# Patient Record
Sex: Male | Born: 1955
Health system: Southern US, Community
[De-identification: ages and names within clinical notes are randomized; demographics above are authoritative.]

## PROBLEM LIST (undated history)

## (undated) DIAGNOSIS — T7840XA Allergy, unspecified, initial encounter: Secondary | ICD-10-CM

## (undated) DIAGNOSIS — M1990 Unspecified osteoarthritis, unspecified site: Secondary | ICD-10-CM

## (undated) DIAGNOSIS — I10 Essential (primary) hypertension: Secondary | ICD-10-CM

## (undated) DIAGNOSIS — K5010 Crohn's disease of large intestine without complications: Secondary | ICD-10-CM

## (undated) HISTORY — DX: Unspecified osteoarthritis, unspecified site: M19.90

## (undated) HISTORY — DX: Allergy, unspecified, initial encounter: T78.40XA

## (undated) HISTORY — DX: Essential (primary) hypertension: I10

## (undated) HISTORY — DX: Crohn's disease of large intestine without complications: K50.10

## (undated) HISTORY — PX: JOINT REPLACEMENT: SHX530

## (undated) HISTORY — PX: TONSILLECTOMY: SUR1361

---

## 2011-01-12 HISTORY — PX: COLONOSCOPY: SHX174

## 2011-04-09 ENCOUNTER — Ambulatory Visit (INDEPENDENT_AMBULATORY_CARE_PROVIDER_SITE_OTHER): Payer: BC Managed Care – PPO

## 2011-04-09 DIAGNOSIS — R059 Cough, unspecified: Secondary | ICD-10-CM

## 2011-04-09 DIAGNOSIS — J40 Bronchitis, not specified as acute or chronic: Secondary | ICD-10-CM

## 2011-04-09 DIAGNOSIS — J019 Acute sinusitis, unspecified: Secondary | ICD-10-CM

## 2011-04-09 DIAGNOSIS — R05 Cough: Secondary | ICD-10-CM

## 2011-04-09 DIAGNOSIS — R5381 Other malaise: Secondary | ICD-10-CM

## 2011-07-04 ENCOUNTER — Ambulatory Visit (INDEPENDENT_AMBULATORY_CARE_PROVIDER_SITE_OTHER): Payer: BC Managed Care – PPO | Admitting: Internal Medicine

## 2011-07-04 VITALS — BP 149/80 | HR 76 | Temp 97.9°F | Resp 18 | Ht 65.0 in | Wt 174.0 lb

## 2011-07-04 DIAGNOSIS — M7040 Prepatellar bursitis, unspecified knee: Secondary | ICD-10-CM

## 2011-07-04 DIAGNOSIS — M7042 Prepatellar bursitis, left knee: Secondary | ICD-10-CM

## 2011-07-04 MED ORDER — MELOXICAM 7.5 MG PO TABS: 7.5000 mg | ORAL_TABLET | Freq: Every day | ORAL | Status: DC

## 2011-07-04 NOTE — Patient Instructions (Signed)
Continue to ice your knee as needed and avoid excessive kneeling.  Try using a pad if you must kneel.  Use the Mobic as needed.  Recheck you BP in 1-2 weeks if it is still elevated (above 140/90) return to the clinic.  Prepatellar Bursitis with Rehab  Bursitis is a condition that is characterized by inflammation of a bursa. Saunders Revel exists in many areas of the body. They are fluid filled sacs that lie between a soft tissue (skin, tendon, or ligament) and a bone, and they reduce friction between the structures as well as the stress placed on the soft tissue. Prepatellar bursitis is inflammation of the bursa that lies between the skin and the kneecap (patella). This condition often causes pain over the patella. SYMPTOMS   Pain, tenderness, and/or inflammation over the patella.   Pain that worsens with movement of the knee joint.   Decreased range of motion for the knee joint.   A crackling sound (crepitation) when the bursa is moved or touched.   Occasionally, painless swelling of the bursa.   Fever (when infected).  CAUSES  Bursitis is caused by damage to the bursa, which results in an inflammatory response. Common mechanisms of injury include:  Direct trauma to the front of the knee.   Repetitive and/ or stressful use of the knee.  RISK INCREASES WITH:  Activities in which kneeling and/or falling on one's knees is likely (volleyball or football).   Repetitive and stressful training, especially if it involves running on hills.   Improper training techniques, such as a sudden increase in the intensity, frequency or duration of training.   Failure to warm-up properly before activity.   Poor technique.   Artificial turf.  PREVENTION   Avoid kneeling or falling on your knees.   Warm up and stretch properly before activity.   Allow for adequate recovery between workouts.   Maintain physical fitness:   Strength, flexibility, and endurance.   Cardiovascular fitness.   Learn  and use proper technique. When possible, a have coach correct improper technique.   Wear properly fitted and padded protective equipment (knee pads).  PROGNOSIS  If treated properly, then the symptoms of prepatellar bursitis usually resolve within 2 weeks. RELATED COMPLICATIONS   Recurrent symptoms that result in a chronic problem.   Prolonged healing time, if improperly treated or re-injured.   Limited range of motion.   Infection of bursa.   Chronic inflammation or scarring of bursa.  TREATMENT  Treatment initially involves the use of ice and medication to help reduce pain and inflammation. The use of strengthening and stretching exercises may help reduce pain with activity, especially those of the quadriceps and hamstring muscles. These exercises may be performed at home or with referral to a therapist. Your caregiver may recommend kneepads when you return to playing sports, in order to reduce the stress on the prepatellar bursa. If symptoms persist despite treatment, then your caregiver may drain fluid out with a needle (aspirate) the bursa. If symptoms persist for greater than 6 months despite non-surgical (conservative) treatment, then surgery may be recommended to remove the bursa.  MEDICATION  If pain medication is necessary, then nonsteroidal anti-inflammatory medications, such as aspirin and ibuprofen, or other minor pain relievers, such as acetaminophen, are often recommended.   Do not take pain medication for 7 days before surgery.   Prescription pain relievers may be given if deemed necessary by your caregiver. Use only as directed and only as much as you need.  Corticosteroid injections may be given by your caregiver. These injections should be reserved for the most serious cases, because they may only be given a certain number of times.  HEAT AND COLD  Cold treatment (icing) relieves pain and reduces inflammation. Cold treatment should be applied for 10 to 15 minutes  every 2 to 3 hours for inflammation and pain and immediately after any activity that aggravates your symptoms. Use ice packs or massage the area with a piece of ice (ice massage).   Heat treatment may be used prior to performing the stretching and strengthening activities prescribed by your caregiver, physical therapist, or athletic trainer. Use a heat pack or soak the injury in warm water.  SEEK MEDICAL CARE IF:  Treatment seems to offer no benefit, or the condition worsens.   Any medications produce adverse side effects.  EXERCISES RANGE OF MOTION (ROM) AND STRETCHING EXERCISES - Prepatellar Bursitis These exercises may help you when beginning to rehabilitate your injury. Your symptoms may resolve with or without further involvement from your physician, physical therapist or athletic trainer. While completing these exercises, remember:   Restoring tissue flexibility helps normal motion to return to the joints. This allows healthier, less painful movement and activity.   An effective stretch should be held for at least 30 seconds.   A stretch should never be painful. You should only feel a gentle lengthening or release in the stretched tissue.  STRETCH - Hamstrings, Standing  Stand or sit and extend your right / left leg, placing your foot on a chair or foot stool   Keeping a slight arch in your low back and your hips straight forward.   Lead with your chest and lean forward at the waist until you feel a gentle stretch in the back of your right / left knee or thigh. (When done correctly, this exercise requires leaning only a small distance.)   Hold this position for __________ seconds.  Repeat __________ times. Complete this stretch __________ times per day. STRETCH - Quadriceps, Prone   Lie on your stomach on a firm surface, such as a bed or padded floor.   Bend your right / left knee and grasp your ankle. If you are unable to reach, your ankle or pant leg, use a belt around your  foot to lengthen your reach.   Gently pull your heel toward your buttocks. Your knee should not slide out to the side. You should feel a stretch in the front of your thigh and/or knee.   Hold this position for __________ seconds.  Repeat __________ times. Complete this stretch __________ times per day.  STRETCH - Hamstrings/Adductors, V-Sit   Sit on the floor with your legs extended in a large "V," keeping your knees straight.   With your head and chest upright, bend at your waist reaching for your right foot to stretch your left adductors.   You should feel a stretch in your left inner thigh. Hold for __________ seconds.   Return to the upright position to relax your leg muscles.   Continuing to keep your chest upright, bend straight forward at your waist to stretch your hamstrings.   You should feel a stretch behind both of your thighs and/or knees. Hold for __________ seconds.   Return to the upright position to relax your leg muscles.   Repeat steps 2 through 4.  Repeat __________ times. Complete this exercise __________ times per day.  STRENGTHENING EXERCISES - Prepatellar Bursitis  These exercises may help you when  beginning to rehabilitate your injury. They may resolve your symptoms with or without further involvement from your physician, physical therapist or athletic trainer. While completing these exercises, remember:   Muscles can gain both the endurance and the strength needed for everyday activities through controlled exercises.   Complete these exercises as instructed by your physician, physical therapist or athletic trainer. Progress the resistance and repetitions only as guided.  STRENGTH - Quadriceps, Isometrics  Lie on your back with your right / left leg extended and your opposite knee bent.   Gradually tense the muscles in the front of your right / left thigh. You should see either your kneecap slide up toward your hip or increased dimpling just above the knee.  This motion will push the back of the knee down toward the floor/mat/bed on which you are lying.   Hold the muscle as tight as you can without increasing your pain for __________ seconds.   Relax the muscles slowly and completely in between each repetition.  Repeat __________ times. Complete this exercise __________ times per day.  STRENGTH - Quadriceps, Short Arcs   Lie on your back. Place a __________ inch towel roll under your knee so that the knee slightly bends.   Raise only your lower leg by tightening the muscles in the front of your thigh. Do not allow your thigh to rise.   Hold this position for __________ seconds.  Repeat __________ times. Complete this exercise __________ times per day.  OPTIONAL ANKLE WEIGHTS: Begin with ____________________, but DO NOT exceed ____________________. Increase in1 lb/0.5 kg increments.  STRENGTH - Quadriceps, Straight Leg Raises  Quality counts! Watch for signs that the quadriceps muscle is working to insure you are strengthening the correct muscles and not "cheating" by substituting with healthier muscles.  Lay on your back with your right / left leg extended and your opposite knee bent.   Tense the muscles in the front of your right / left thigh. You should see either your kneecap slide up or increased dimpling just above the knee. Your thigh may even quiver.   Tighten these muscles even more and raise your leg 4 to 6 inches off the floor. Hold for __________ seconds.   Keeping these muscles tense, lower your leg.   Relax the muscles slowly and completely in between each repetition.  Repeat __________ times. Complete this exercise __________ times per day.  STRENGTH - Quadriceps, Step-Ups   Use a thick book, step or step stool that is __________ inches tall.   Holding a wall or counter for balance only, not support.   Slowly step-up with your right / left foot, keeping your knee in line with your hip and foot. Do not allow your knee to  bend so far that you cannot see your toes.   Slowly unlock your knee and lower yourself to the starting position. Your muscles, not gravity, should lower you.  Repeat __________ times. Complete this exercise __________ times per day. Document Released: 03/16/2005 Document Revised: 03/05/2011 Document Reviewed: 06/28/2008 [facility] Patient Information 2012 [place].

## 2011-07-04 NOTE — Progress Notes (Signed)
  Subjective:    Patient ID: Phillip Glass, male    DOB: Sep 30, 1955, 56 y.o.   MRN: [id number]  HPI  Phillip Glass is here for pain in the front of his left knee.  Only hurts when he kneels on that knee.  He works for [employer] and has been working 60 hours a week and is experiencing knee pain when he is "stacking" only.  No weakness, numbness or pain in his foot, ankle or hip.  He had a CPE with Dr. Elder Cyphers in September.  Bp is usually not elevated.    Review of Systems  All other systems reviewed and are negative.       Objective:   Physical Exam  Vitals reviewed. Constitutional: He appears well-developed and well-nourished. No distress.  HENT:  Head: Normocephalic.  Mouth/Throat: Oropharynx is clear and moist.  Cardiovascular: Normal rate, regular rhythm and normal heart sounds.   Musculoskeletal: Normal range of motion. He exhibits tenderness. He exhibits no edema.       Right knee tender over prepatellar bursa, but no significant swelling or redness is present.  Normal ROM, no Baker's cyst.          Assessment & Plan:  Prepatellar Bursitis:  Mobic 7.5 mg if needed.  Ice knees and avoid kneeling!  Given AVS with instructions and information on exercises and stretches.  Given letter to keep his hours to no more than 50 a week.  Keep an eye on his BP and if it remains above 140/90 he is to RTC.  Pt agrees.

## 2012-03-03 ENCOUNTER — Ambulatory Visit (INDEPENDENT_AMBULATORY_CARE_PROVIDER_SITE_OTHER): Payer: BC Managed Care – PPO | Admitting: Family Medicine

## 2012-03-03 VITALS — BP 141/79 | HR 73 | Temp 98.4°F | Resp 17 | Ht 66.0 in | Wt 173.0 lb

## 2012-03-03 DIAGNOSIS — Z0000 Encounter for general adult medical examination without abnormal findings: Secondary | ICD-10-CM

## 2012-03-03 DIAGNOSIS — D229 Melanocytic nevi, unspecified: Secondary | ICD-10-CM | POA: Insufficient documentation

## 2012-03-03 LAB — COMPREHENSIVE METABOLIC PANEL
ALT: 18 U/L (ref 0–53)
Albumin: 4.5 g/dL (ref 3.5–5.2)
CO2: 30 mEq/L (ref 19–32)
Calcium: 9.3 mg/dL (ref 8.4–10.5)
Chloride: 102 mEq/L (ref 96–112)
Glucose, Bld: 102 mg/dL — ABNORMAL HIGH (ref 70–99)
Potassium: 4.2 mEq/L (ref 3.5–5.3)
Sodium: 140 mEq/L (ref 135–145)
Total Protein: 6.9 g/dL (ref 6.0–8.3)

## 2012-03-03 LAB — POCT CBC
Granulocyte percent: 75.8 %G (ref 37–80)
Hemoglobin: 14.7 g/dL (ref 14.1–18.1)
MCH, POC: 31.7 pg — AB (ref 27–31.2)
MCV: 99.8 fL — AB (ref 80–97)
MPV: 8.4 fL (ref 0–99.8)
POC MID %: 6.9 %M (ref 0–12)
Platelet Count, POC: 330 10*3/uL (ref 142–424)
RBC: 4.63 M/uL — AB (ref 4.69–6.13)
WBC: 7.4 10*3/uL (ref 4.6–10.2)

## 2012-03-03 LAB — POCT URINALYSIS DIPSTICK
Bilirubin, UA: NEGATIVE
Leukocytes, UA: NEGATIVE
Protein, UA: NEGATIVE
Spec Grav, UA: 1.015

## 2012-03-03 LAB — LIPID PANEL
Cholesterol: 196 mg/dL (ref 0–200)
Triglycerides: 83 mg/dL (ref <150)
VLDL: 17 mg/dL (ref 0–40)

## 2012-03-03 LAB — POCT UA - MICROSCOPIC ONLY
Bacteria, U Microscopic: NEGATIVE
Crystals, Ur, HPF, POC: NEGATIVE
RBC, urine, microscopic: NEGATIVE
WBC, Ur, HPF, POC: NEGATIVE

## 2012-03-03 LAB — VITAMIN B12: Vitamin B-12: 706 pg/mL (ref 211–911)

## 2012-03-03 LAB — TSH: TSH: 1.824 u[IU]/mL (ref 0.350–4.500)

## 2012-03-03 NOTE — Assessment & Plan Note (Signed)
New. Refer to dermatology for evaluation.

## 2012-03-03 NOTE — Progress Notes (Signed)
[place], [place]  [address]   [phone]  Subjective:    Patient ID: Phillip Glass, male    DOB: [birth year], 56 y.o.   MRN: [id number]  HPIThis 56 y.o. male presents for CPE.  Last physical 12-2010.  Colonoscopy 12-2010.  [facility]AP 2008 at [facility].  Influenza vaccine never.  Eye exam 12/2011.  Dental exam 01/2012.     Review of Systems  Constitutional: Negative for fever, chills, diaphoresis, activity change, appetite change, fatigue and unexpected weight change.  HENT: Negative for hearing loss, ear pain, nosebleeds, congestion, sore throat, facial swelling, rhinorrhea, sneezing, drooling, mouth sores, trouble swallowing, neck pain, neck stiffness, dental problem, voice change, postnasal drip, sinus pressure, tinnitus and ear discharge.   Eyes: Negative for photophobia, pain, discharge, redness, itching and visual disturbance.  Respiratory: Negative for apnea, cough, choking, chest tightness, shortness of breath, wheezing and stridor.   Cardiovascular: Negative for chest pain, palpitations and leg swelling.  Gastrointestinal: Negative for nausea, vomiting, abdominal pain, diarrhea, constipation, blood in stool, abdominal distention, anal bleeding and rectal pain.  Genitourinary: Negative for dysuria, urgency, frequency, hematuria, flank pain, decreased urine volume, discharge, penile swelling, scrotal swelling, enuresis, difficulty urinating, genital sores, penile pain and testicular pain.  Musculoskeletal: Negative for myalgias, back pain, joint swelling, arthralgias and gait problem.  Skin: Negative for color change, pallor, rash and wound.  Neurological: Negative for dizziness, tremors, seizures, syncope, facial asymmetry, speech difficulty, weakness, light-headedness, numbness and headaches.  Hematological: Negative for adenopathy. Does not bruise/bleed easily.  Psychiatric/Behavioral: Positive for sleep disturbance. Negative for suicidal ideas, hallucinations, behavioral  problems, confusion, self-injury, dysphoric mood, decreased concentration and agitation. The patient is not nervous/anxious and is not hyperactive.     Past Medical History  Diagnosis Date  . Colitis 03/30/1978    Past Surgical History  Procedure Date  . Colonoscopy 01/12/2011    Prior to Admission medications   Medication Sig Start Date End Date Taking? Authorizing Provider  Multiple Vitamins-Minerals (MULTIVITAMIN WITH MINERALS) tablet Take 1 tablet by mouth daily.   Yes Historical Provider, MD  meloxicam (MOBIC) 7.5 MG tablet Take 1 tablet (7.5 mg total) by mouth daily. 07/04/11 07/03/12  Kemper Durie, PA-C    Allergies  Allergen Reactions  . Codeine     History   Social History  . Marital Status: Legally Separated    Spouse Name: N/A    Number of Children: N/A  . Years of Education: N/A   Occupational History  . Not on file.   Social History Main Topics  . Smoking status: Never Smoker   . Smokeless tobacco: Not on file  . Alcohol Use: No  . Drug Use: No  . Sexually Active: No   Other Topics Concern  . Not on file   Social History Narrative   Marital status: divorced in 2013.  Not dating.  Married x 20 years.   Children:  1 child (22); no grandchild.   Lives: with son.   Employment: Scientist, clinical (histocompatibility and immunogenetics) x 35 years; happy.   Tobacco; none   Alcohol: none   Drugs; none   Exercise:  6 days per week for 45 minutes.  Cardio twice weekly; weightlifting 5 days per week.   Seatbelt: 100%   Guns:  Locked and loaded.    Family History  Problem Relation Age of Onset  . Hypertension Father   . Heart disease Father 47    AMI age 48; second  AMI age 75 cause of  death  . Diabetes Sister   . Dementia Mother   . Hypertension Mother        Objective:   Physical Exam  Nursing note and vitals reviewed. Constitutional: He is oriented to person, place, and time. He appears well-developed and well-nourished. No distress.  HENT:  Head: Normocephalic and atraumatic.  Right Ear:  External ear normal.  Left Ear: External ear normal.  Nose: Nose normal.  Mouth/Throat: Oropharynx is clear and moist.  Eyes: Conjunctivae normal and EOM are normal. Pupils are equal, round, and reactive to light.  Neck: Normal range of motion. Neck supple. No JVD present. No thyromegaly present.  Cardiovascular: Normal rate, regular rhythm and intact distal pulses.  Exam reveals no gallop and no friction rub.   No murmur heard. Pulmonary/Chest: Effort normal and breath sounds normal. No respiratory distress. He has no wheezes. He has no rales. He exhibits no tenderness.  Abdominal: Soft. Bowel sounds are normal. He exhibits no distension and no mass. There is no tenderness. There is no rebound and no guarding. Hernia confirmed negative in the right inguinal area and confirmed negative in the left inguinal area.  Genitourinary: Rectum normal, prostate normal, testes normal and penis normal. Right testis shows no mass, no swelling and no tenderness. Left testis shows no mass, no swelling and no tenderness. No penile tenderness.  Lymphadenopathy:    He has no cervical adenopathy.       Right: No inguinal adenopathy present.       Left: No inguinal adenopathy present.  Neurological: He is alert and oriented to person, place, and time. He has normal reflexes. No cranial nerve deficit. He exhibits normal muscle tone. Coordination normal.  Skin: Skin is warm and dry. No rash noted. He is not diaphoretic. No erythema. No pallor.       Multiple nevi back, legs.  Psychiatric: He has a normal mood and affect. His behavior is normal. Judgment and thought content normal.   EKG:  NSR.  No acute changes.       Assessment & Plan:   1. Routine general medical examination at a health care facility  POCT CBC, POCT glycosylated hemoglobin (Hb A1C), POCT UA - Microscopic Only, POCT urinalysis dipstick, Comprehensive metabolic panel, Lipid panel, PSA, TSH, Vitamin B12, Vitamin D, 25-hydroxy, EKG 12-Lead

## 2012-03-03 NOTE — Patient Instructions (Addendum)
1. Routine general medical examination at a health care facility  POCT CBC, POCT glycosylated hemoglobin (Hb A1C), POCT UA - Microscopic Only, POCT urinalysis dipstick, Comprehensive metabolic panel, Lipid panel, PSA, TSH, Vitamin B12, Vitamin D, 25-hydroxy, EKG 12-Lead  2. Multiple nevi  Ambulatory referral to Dermatology

## 2012-03-03 NOTE — Assessment & Plan Note (Signed)
Anticipatory guidance --- continued weight maintenance, exercise.  Start ASA 78m daily.  Colonoscopy UTD.  Declined flu vaccine.  Obtain labs.

## 2012-03-09 NOTE — Progress Notes (Signed)
Six week f-up appt made for 04/25/12. Phillip Glass

## 2012-03-12 ENCOUNTER — Encounter: Payer: Self-pay | Admitting: Family Medicine

## 2012-03-12 ENCOUNTER — Ambulatory Visit (INDEPENDENT_AMBULATORY_CARE_PROVIDER_SITE_OTHER): Payer: BC Managed Care – PPO | Admitting: Family Medicine

## 2012-03-12 VITALS — BP 123/78 | HR 71 | Temp 98.1°F | Resp 16 | Ht 65.75 in | Wt 171.2 lb

## 2012-03-12 DIAGNOSIS — K625 Hemorrhage of anus and rectum: Secondary | ICD-10-CM

## 2012-03-12 DIAGNOSIS — Z8719 Personal history of other diseases of the digestive system: Secondary | ICD-10-CM

## 2012-03-12 DIAGNOSIS — Z87898 Personal history of other specified conditions: Secondary | ICD-10-CM

## 2012-03-12 DIAGNOSIS — K6289 Other specified diseases of anus and rectum: Secondary | ICD-10-CM

## 2012-03-12 MED ORDER — HYDROCORTISONE ACETATE 25 MG RE SUPP: 25.0000 mg | Freq: Two times a day (BID) | RECTAL | Status: DC

## 2012-03-12 NOTE — Patient Instructions (Addendum)
Due to the location of your pain, a hemorrhoid is possible, but one was not identified on exam today.  Try to avoid prolonged standing or sitting, warm water soaks if feels better, anusol suppositories as prescribed and recheck in next 2-3 days if not improving - here or your gastroenterologist. Return to the clinic or go to the nearest emergency room if any of your symptoms worsen or new symptoms occur.  Hemorrhoids Hemorrhoids are enlarged (dilated) veins around the rectum. There are 2 types of hemorrhoids, and the type of hemorrhoid is determined by its location. Internal hemorrhoids occur in the veins just inside the rectum.They are usually not painful, but they may bleed.However, they may poke through to the outside and become irritated and painful. External hemorrhoids involve the veins outside the anus and can be felt as a painful swelling or hard lump near the anus.They are often itchy and may crack and bleed. Sometimes clots will form in the veins. This makes them swollen and painful. These are called thrombosed hemorrhoids. CAUSES Causes of hemorrhoids include:  Pregnancy. This increases the pressure in the hemorrhoidal veins.  Constipation.  Straining to have a bowel movement.  Obesity.  Heavy lifting or other activity that caused you to strain. TREATMENT Most of the time hemorrhoids improve in 1 to 2 weeks. However, if symptoms do not seem to be getting better or if you have a lot of rectal bleeding, your caregiver may perform a procedure to help make the hemorrhoids get smaller or remove them completely.Possible treatments include:  Rubber band ligation. A rubber band is placed at the base of the hemorrhoid to cut off the circulation.  Sclerotherapy. A chemical is injected to shrink the hemorrhoid.  Infrared light therapy. Tools are used to burn the hemorrhoid.  Hemorrhoidectomy. This is surgical removal of the hemorrhoid. HOME CARE INSTRUCTIONS   Increase fiber in your  diet. Ask your caregiver about using fiber supplements.  Drink enough water and fluids to keep your urine clear or pale yellow.  Exercise regularly.  Go to the bathroom when you have the urge to have a bowel movement. Do not wait.  Avoid straining to have bowel movements.  Keep the anal area dry and clean.  Only take over-the-counter or prescription medicines for pain, discomfort, or fever as directed by your caregiver. If your hemorrhoids are thrombosed:  Take warm sitz baths for 20 to 30 minutes, 3 to 4 times per day.  If the hemorrhoids are very tender and swollen, place ice packs on the area as tolerated. Using ice packs between sitz baths may be helpful. Fill a plastic bag with ice. Place a towel between the bag of ice and your skin.  Medicated creams and suppositories may be used or applied as directed.  Do not use a donut-shaped pillow or sit on the toilet for long periods. This increases blood pooling and pain. SEEK MEDICAL CARE IF:   You have increasing pain and swelling that is not controlled with your medicine.  You have uncontrolled bleeding.  You have difficulty or you are unable to have a bowel movement.  You have pain or inflammation outside the area of the hemorrhoids.  You have chills or an oral temperature above 102 F (38.9 C). MAKE SURE YOU:   Understand these instructions.  Will watch your condition.  Will get help right away if you are not doing well or get worse. Document Released: 03/13/2000 Document Revised: 06/08/2011 Document Reviewed: 02/24/2010 [facility] Patient Information 2013 [place].

## 2012-03-12 NOTE — Progress Notes (Signed)
Subjective:    Patient ID: Phillip Glass, male    DOB: 1955/10/18, 56 y.o.   MRN: [id number]  HPI Phillip Glass is a 56 y.o. male Bright red blood in stool, pain in rectal area with standing for awhile.  Better with sitting. Loose stool - liquid/gas with mucus - past week. (same time period for rectal pain). Bright red blood with wiping. Colonoscopy 10/12 - colonoscopy with colitis. GI: [facility] in [place]. Treated multiple times in past for colitis? No recent treatment.   Tx: Taking 229m ibuprofen once per day when working. otc preparation H for 3 days - suppository - helped pain for a little while. Hemorrhoids 24 years ago.     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 No recent change in lifting/straining. Does workout, but no recent heavy weight or straining.    Review of Systems  Constitutional: Negative for fever, chills and unexpected weight change.  Gastrointestinal: Positive for diarrhea (looser stool - see above. ) and blood in stool. Negative for constipation.       Objective:   Physical Exam  Vitals reviewed. Constitutional: He appears well-developed and well-nourished.  HENT:  Head: Normocephalic and atraumatic.  Pulmonary/Chest: Effort normal.  Abdominal: Soft. Bowel sounds are normal. He exhibits no distension. There is no tenderness. There is no CVA tenderness.  Genitourinary: Rectal exam shows tenderness (discomfort on l side of anus - internally, without noted hemorrhoid.  uncomfortable exam.) and anal tone abnormal. Rectal exam shows no external hemorrhoid and no fissure. Prostate is not tender.  Skin: Skin is warm and dry.   Results for orders placed in visit on 03/12/12  IFOBT (OCCULT BLOOD)      Component Value Range   IFOBT Positive         Assessment & Plan:  Phillip CASSis a 56y.o. male 1. Rectum bleeding  IFOBT POC (occult bld, rslt in office), hydrocortisone (ANUSOL-HC) 25 MG suppository  2. Hx of hemorrhoids   hydrocortisone (ANUSOL-HC) 25 MG suppository  3. Rectal or anal pain  hydrocortisone (ANUSOL-HC) 25 MG suppository    Possible hemorrhoid based on history and exam with ttp on L rectal area near anus without visualized hemorrhoid. Hx of colitis years ago, but no abdominal pain, and has been asx for a long time. No external hemorrhoid or fissure noted, but symptomatic improvement at home with suppository. Will try relative rest - see work note, sitz baths if needed, and trial of Anusol suppositories - ok to rf x 1 if needed. If not improving in next 2-3 days - will likely need to see GI, or rtc for further eval or possible tx of  Colitis. Deferred anoscopy today, but may need to do this in next few days if not improving. rtc sooner if any worsening.   Patient Instructions  Due to the location of your pain, a hemorrhoid is possible, but one was not identified on exam today.  Try to avoid prolonged standing or sitting, warm water soaks if feels better, anusol suppositories as prescribed and recheck in next 2-3 days if not improving - here or your gastroenterologist. Return to the clinic or go to the nearest emergency room if any of your symptoms worsen or new symptoms occur.  Hemorrhoids Hemorrhoids are enlarged (dilated) veins around the rectum. There are 2 types of hemorrhoids, and the type of hemorrhoid is determined by its location. Internal hemorrhoids occur in the veins just inside the rectum.They are usually not painful,  but they may bleed.However, they may poke through to the outside and become irritated and painful. External hemorrhoids involve the veins outside the anus and can be felt as a painful swelling or hard lump near the anus.They are often itchy and may crack and bleed. Sometimes clots will form in the veins. This makes them swollen and painful. These are called thrombosed hemorrhoids. CAUSES Causes of hemorrhoids include:  Pregnancy. This increases the pressure in the hemorrhoidal  veins.  Constipation.  Straining to have a bowel movement.  Obesity.  Heavy lifting or other activity that caused you to strain. TREATMENT Most of the time hemorrhoids improve in 1 to 2 weeks. However, if symptoms do not seem to be getting better or if you have a lot of rectal bleeding, your caregiver may perform a procedure to help make the hemorrhoids get smaller or remove them completely.Possible treatments include:  Rubber band ligation. A rubber band is placed at the base of the hemorrhoid to cut off the circulation.  Sclerotherapy. A chemical is injected to shrink the hemorrhoid.  Infrared light therapy. Tools are used to burn the hemorrhoid.  Hemorrhoidectomy. This is surgical removal of the hemorrhoid. HOME CARE INSTRUCTIONS   Increase fiber in your diet. Ask your caregiver about using fiber supplements.  Drink enough water and fluids to keep your urine clear or pale yellow.  Exercise regularly.  Go to the bathroom when you have the urge to have a bowel movement. Do not wait.  Avoid straining to have bowel movements.  Keep the anal area dry and clean.  Only take over-the-counter or prescription medicines for pain, discomfort, or fever as directed by your caregiver. If your hemorrhoids are thrombosed:  Take warm sitz baths for 20 to 30 minutes, 3 to 4 times per day.  If the hemorrhoids are very tender and swollen, place ice packs on the area as tolerated. Using ice packs between sitz baths may be helpful. Fill a plastic bag with ice. Place a towel between the bag of ice and your skin.  Medicated creams and suppositories may be used or applied as directed.  Do not use a donut-shaped pillow or sit on the toilet for long periods. This increases blood pooling and pain. SEEK MEDICAL CARE IF:   You have increasing pain and swelling that is not controlled with your medicine.  You have uncontrolled bleeding.  You have difficulty or you are unable to have a bowel  movement.  You have pain or inflammation outside the area of the hemorrhoids.  You have chills or an oral temperature above 102 F (38.9 C). MAKE SURE YOU:   Understand these instructions.  Will watch your condition.  Will get help right away if you are not doing well or get worse. Document Released: 03/13/2000 Document Revised: 06/08/2011 Document Reviewed: 02/24/2010 [facility] Patient Information 2013 [place].

## 2012-03-13 ENCOUNTER — Telehealth: Payer: Self-pay

## 2012-03-13 NOTE — Telephone Encounter (Signed)
PT WOULD LIKE TO KNOW IF THERE IS ANYTHING THAT HE CAN TAKE FOR HIS RECTAL PAIN. BEST# [phone] PHARMACY: [facility] [place]

## 2012-03-14 NOTE — Telephone Encounter (Signed)
Spoke with patient states he is doing better.  He is due for a follow up on Tuesday 12/17, he will return to clinic then.

## 2012-03-16 ENCOUNTER — Ambulatory Visit: Payer: BC Managed Care – PPO

## 2012-03-16 ENCOUNTER — Ambulatory Visit (INDEPENDENT_AMBULATORY_CARE_PROVIDER_SITE_OTHER): Payer: BC Managed Care – PPO | Admitting: Family Medicine

## 2012-03-16 VITALS — BP 135/86 | HR 78 | Temp 98.0°F | Resp 16 | Ht 66.5 in | Wt 170.0 lb

## 2012-03-16 DIAGNOSIS — K602 Anal fissure, unspecified: Secondary | ICD-10-CM

## 2012-03-16 DIAGNOSIS — K5900 Constipation, unspecified: Secondary | ICD-10-CM

## 2012-03-16 DIAGNOSIS — K625 Hemorrhage of anus and rectum: Secondary | ICD-10-CM

## 2012-03-16 DIAGNOSIS — Z8719 Personal history of other diseases of the digestive system: Secondary | ICD-10-CM

## 2012-03-16 DIAGNOSIS — K6289 Other specified diseases of anus and rectum: Secondary | ICD-10-CM

## 2012-03-16 MED ORDER — LIDOCAINE (ANORECTAL) 5 % EX GEL: 1.0000 "application " | Freq: Three times a day (TID) | CUTANEOUS | Status: DC | PRN

## 2012-03-16 MED ORDER — HYDROCORTISONE ACETATE 25 MG RE SUPP: 25.0000 mg | Freq: Two times a day (BID) | RECTAL | Status: DC

## 2012-03-16 MED ORDER — DILTIAZEM GEL 2 %: 1.0000 "application " | Freq: Three times a day (TID) | CUTANEOUS | Status: DC | PRN

## 2012-03-16 NOTE — Progress Notes (Signed)
Subjective:    Patient ID: Phillip Glass, male    DOB: 01/26/1956, 56 y.o.   MRN: [id number]  HPI Phillip Glass is a 56 y.o. male  See last office visit. Rectal bleeding with rectal pain - suspected hemorrhoid based on history and exam with ttp on L rectal area near anus without visualized hemorrhoid.  No external hemorrhoid or fissure noted at last ov, but symptomatic improvement at home with suppository.  Rx relative rest, sitz baths if needed, and trial of Anusol suppositories. Still sore at anal area. Sore at anus with defecation. Feels like the bump is less inside.  Anusol suppositories helping. Mucus and blood with defecation.  Small hard bm's - 3 times today. Last normal stool about 2 weeks ago.   No fever/chills. Sore if standing over 5 hours. Trying to sit down for 15 minute intervals at times.    Review of Systems  Constitutional: Negative for fever and chills.  Gastrointestinal: Positive for constipation. Negative for nausea, vomiting, abdominal pain, diarrhea and abdominal distention.  Skin: Negative for rash.       Objective:   Physical Exam  Vitals reviewed. Constitutional: He is oriented to person, place, and time.  Pulmonary/Chest: Effort normal.  Abdominal: Soft. Bowel sounds are normal. He exhibits no distension. There is no tenderness. There is no rebound and no guarding.  Genitourinary: Rectal exam shows fissure (small fissure at 6 clock, ttp in this area.  no acute bleeding.) and tenderness.     Neurological: He is alert and oriented to person, place, and time.  Skin: Skin is warm and dry.  Psychiatric: He has a normal mood and affect. His behavior is normal.    [facility] reading (PRIMARY) by  Dr. Carlota Raspberry: increased stool. Otherwise nonspecific.         Assessment & Plan:  Phillip Glass is a 56 y.o. male 1. Constipation  DG Abd 1 View  2. Rectal bleeding  DG Abd 1 View  3. Anal fissure  DG Abd 1 View   initailly suspected hemorrhoid, but now ttp  over likely fissure as cause of anal pain.  Hx now suggestive of preceeding constipation.  Trial of Cardizem and lidocaine topically, miralax otc, mineral oil suppository if needed. If hemorrhoid more symptomatic - refilled Anusol HC. rtc precautions.   Patient Instructions  Try the topical gels as discussed for likely anal fissure as cause of pain and bleeding.   Miralax over the counter, and if needed - fleets mineral oil enema for constipation.  See below for other constipation treatments.   Return to the clinic or go to the nearest emergency room if any of your symptoms worsen or new symptoms occur. If not improving in next 4-5 days - can recheck.   Constipation, Adult Constipation is when a person has fewer than 3 bowel movements a week; has difficulty having a bowel movement; or has stools that are dry, hard, or larger than normal. As people grow older, constipation is more common. If you try to fix constipation with medicines that make you have a bowel movement (laxatives), the problem may get worse. Long-term laxative use may cause the muscles of the colon to become weak. A low-fiber diet, not taking in enough fluids, and taking certain medicines may make constipation worse. CAUSES   Certain medicines, such as antidepressants, pain medicine, iron supplements, antacids, and water pills.   Certain diseases, such as diabetes, irritable bowel syndrome (IBS), thyroid disease, or depression.   Not drinking  enough water.   Not eating enough fiber-rich foods.   Stress or travel.  Lack of physical activity or exercise.  Not going to the restroom when there is the urge to have a bowel movement.  Ignoring the urge to have a bowel movement.  Using laxatives too much. SYMPTOMS   Having fewer than 3 bowel movements a week.   Straining to have a bowel movement.   Having hard, dry, or larger than normal stools.   Feeling full or bloated.   Pain in the lower abdomen.  Not  feeling relief after having a bowel movement. DIAGNOSIS  Your caregiver will take a medical history and perform a physical exam. Further testing may be done for severe constipation. Some tests may include:   A barium enema X-ray to examine your rectum, colon, and sometimes, your small intestine.  A sigmoidoscopy to examine your lower colon.  A colonoscopy to examine your entire colon. TREATMENT  Treatment will depend on the severity of your constipation and what is causing it. Some dietary treatments include drinking more fluids and eating more fiber-rich foods. Lifestyle treatments may include regular exercise. If these diet and lifestyle recommendations do not help, your caregiver may recommend taking over-the-counter laxative medicines to help you have bowel movements. Prescription medicines may be prescribed if over-the-counter medicines do not work.  HOME CARE INSTRUCTIONS   Increase dietary fiber in your diet, such as fruits, vegetables, whole grains, and beans. Limit high-fat and processed sugars in your diet, such as [place] fries, hamburgers, cookies, candies, and soda.   A fiber supplement may be added to your diet if you cannot get enough fiber from foods.   Drink enough fluids to keep your urine clear or pale yellow.   Exercise regularly or as directed by your caregiver.   Go to the restroom when you have the urge to go. Do not hold it.  Only take medicines as directed by your caregiver. Do not take other medicines for constipation without talking to your caregiver first. [place] IF:   You have bright red blood in your stool.   Your constipation lasts for more than 4 days or gets worse.   You have abdominal or rectal pain.   You have thin, pencil-like stools.  You have unexplained weight loss. MAKE SURE YOU:   Understand these instructions.  Will watch your condition.  Will get help right away if you are not doing well or get  worse. Document Released: 12/13/2003 Document Revised: 06/08/2011 Document Reviewed: 02/17/2011 [facility] Patient Information 2013 [place].

## 2012-03-16 NOTE — Patient Instructions (Signed)
Try the topical gels as discussed for likely anal fissure as cause of pain and bleeding.   Miralax over the counter, and if needed - fleets mineral oil enema for constipation.  See below for other constipation treatments.   Return to the clinic or go to the nearest emergency room if any of your symptoms worsen or new symptoms occur. If not improving in next 4-5 days - can recheck.   Constipation, Adult Constipation is when a person has fewer than 3 bowel movements a week; has difficulty having a bowel movement; or has stools that are dry, hard, or larger than normal. As people grow older, constipation is more common. If you try to fix constipation with medicines that make you have a bowel movement (laxatives), the problem may get worse. Long-term laxative use may cause the muscles of the colon to become weak. A low-fiber diet, not taking in enough fluids, and taking certain medicines may make constipation worse. CAUSES   Certain medicines, such as antidepressants, pain medicine, iron supplements, antacids, and water pills.   Certain diseases, such as diabetes, irritable bowel syndrome (IBS), thyroid disease, or depression.   Not drinking enough water.   Not eating enough fiber-rich foods.   Stress or travel.  Lack of physical activity or exercise.  Not going to the restroom when there is the urge to have a bowel movement.  Ignoring the urge to have a bowel movement.  Using laxatives too much. SYMPTOMS   Having fewer than 3 bowel movements a week.   Straining to have a bowel movement.   Having hard, dry, or larger than normal stools.   Feeling full or bloated.   Pain in the lower abdomen.  Not feeling relief after having a bowel movement. DIAGNOSIS  Your caregiver will take a medical history and perform a physical exam. Further testing may be done for severe constipation. Some tests may include:   A barium enema X-ray to examine your rectum, colon, and sometimes,  your small intestine.  A sigmoidoscopy to examine your lower colon.  A colonoscopy to examine your entire colon. TREATMENT  Treatment will depend on the severity of your constipation and what is causing it. Some dietary treatments include drinking more fluids and eating more fiber-rich foods. Lifestyle treatments may include regular exercise. If these diet and lifestyle recommendations do not help, your caregiver may recommend taking over-the-counter laxative medicines to help you have bowel movements. Prescription medicines may be prescribed if over-the-counter medicines do not work.  HOME CARE INSTRUCTIONS   Increase dietary fiber in your diet, such as fruits, vegetables, whole grains, and beans. Limit high-fat and processed sugars in your diet, such as [place] fries, hamburgers, cookies, candies, and soda.   A fiber supplement may be added to your diet if you cannot get enough fiber from foods.   Drink enough fluids to keep your urine clear or pale yellow.   Exercise regularly or as directed by your caregiver.   Go to the restroom when you have the urge to go. Do not hold it.  Only take medicines as directed by your caregiver. Do not take other medicines for constipation without talking to your caregiver first. [place] IF:   You have bright red blood in your stool.   Your constipation lasts for more than 4 days or gets worse.   You have abdominal or rectal pain.   You have thin, pencil-like stools.  You have unexplained weight loss. MAKE SURE YOU:  Understand these instructions.  Will watch your condition.  Will get help right away if you are not doing well or get worse. Document Released: 12/13/2003 Document Revised: 06/08/2011 Document Reviewed: 02/17/2011 [facility] Patient Information 2013 [place].

## 2012-03-17 ENCOUNTER — Telehealth: Payer: Self-pay

## 2012-03-17 MED ORDER — MELOXICAM 7.5 MG PO TABS: 7.5000 mg | ORAL_TABLET | Freq: Every day | ORAL | Status: DC

## 2012-03-17 NOTE — Telephone Encounter (Signed)
PATIENT STATES HE WAS IN THE OFFICE LAST NIGHT TO SEE DR. GREENE. HE GOT ALL OF HIS PRESCRIPTIONS FILLED EXCEPT MELOXICAM 7.5MG FOR HIS PAIN. HE WOULD LIKE TO GET THAT CALLED INTO THE PHARMACY AS SOON AS POSSIBLE PLEASE. BEST PHONE [phone] (CELL)   PHARMACY CHOICE IS [facility] [address].   [place]

## 2012-03-17 NOTE — Telephone Encounter (Signed)
Please advise on renewal of meloxicam, pended

## 2012-03-17 NOTE — Telephone Encounter (Signed)
I have sent #30 to the pharmacy, but this was not discussed at his visit yesterday, and we have not prescribed this for him since April 2013.  Will need an office visit for more

## 2012-03-17 NOTE — Telephone Encounter (Signed)
Spoke with pt advised Rx at pharmacy.

## 2012-03-22 ENCOUNTER — Ambulatory Visit (INDEPENDENT_AMBULATORY_CARE_PROVIDER_SITE_OTHER): Payer: BC Managed Care – PPO | Admitting: Family Medicine

## 2012-03-22 VITALS — BP 133/73 | HR 76 | Temp 98.5°F | Resp 16 | Ht 66.5 in | Wt 170.0 lb

## 2012-03-22 DIAGNOSIS — K5900 Constipation, unspecified: Secondary | ICD-10-CM

## 2012-03-22 MED ORDER — LACTULOSE 20 GM/30ML PO SOLN: 20.0000 g | Freq: Two times a day (BID) | ORAL | Status: DC | PRN

## 2012-03-22 NOTE — Progress Notes (Signed)
[facility]:  Office Visit  Chief Complaint:  Chief Complaint  Patient presents with  . Follow-up    Constipation; not any better    HPI: Phillip Glass is a 56 y.o. male who complains of constipation, tried Miralax daily and also fleets enema x 2. Not improved. Early December.  Last BM was on Saturday, soft loose normal caliber, non-bloody. Looks like frosty swirl from [employer]. No nausea, fevers, vomiting, abd distension.  + rectal pain with some bleeding but less than before. Colonoscopy 2012 was normal. No h/o abd surgery.  Past Medical History  Diagnosis Date  . Colitis 03/30/1978   Past Surgical History  Procedure Date  . Colonoscopy 01/12/2011  . Tonsillectomy    History   Social History  . Marital Status: Legally Separated    Spouse Name: N/A    Number of Children: N/A  . Years of Education: N/A   Social History Main Topics  . Smoking status: Never Smoker   . Smokeless tobacco: Never Used  . Alcohol Use: No  . Drug Use: No  . Sexually Active: No   Other Topics Concern  . None   Social History Narrative   Marital status: divorced in 2013.  Not dating.  Married x 20 years.   Children:  1 child (22); no grandchild.   Lives: with son.   Employment: Scientist, clinical (histocompatibility and immunogenetics) x 35 years; happy.   Tobacco; none   Alcohol: none   Drugs; none   Exercise:  6 days per week for 45 minutes.  Cardio twice weekly; weightlifting 5 days per week.   Seatbelt: 100%   Guns:  Locked and loaded.   Family History  Problem Relation Age of Onset  . Hypertension Father   . Heart disease Father 65    AMI age 64; second  AMI age 54 cause of death  . Diabetes Sister   . Dementia Mother   . Hypertension Mother    Allergies  Allergen Reactions  . Codeine Other (See Comments)    Pt states he hallucinates   Prior to Admission medications   Medication Sig Start Date End Date Taking? Authorizing Provider  diltiazem 2 % GEL Apply 1 application topically 3 (three) times  daily as needed. Pea sized amount pr as discussed 03/16/12  Yes [name], MD  hydrocortisone (ANUSOL-HC) 25 MG suppository Place 1 suppository (25 mg total) rectally 2 (two) times daily. 03/16/12  Yes [name], MD  Lidocaine, Anorectal, 5 % GEL Apply 1 application topically 3 (three) times daily as needed. Pea sized amount pr as discussed. 03/16/12  Yes [name], MD  meloxicam (MOBIC) 7.5 MG tablet Take 1 tablet (7.5 mg total) by mouth daily. 03/17/12 03/17/13 Yes Eleanore Kurtis Bushman, PA-C  Multiple Vitamins-Minerals (MULTIVITAMIN WITH MINERALS) tablet Take 1 tablet by mouth daily.   Yes Historical Provider, MD     ROS: The patient denies fevers, chills, night sweats, unintentional weight loss, chest pain, palpitations, wheezing, dyspnea on exertion, nausea, vomiting, abdominal pain, dysuria, hematuria, melena, numbness, weakness, or tingling.   All other systems have been reviewed and were otherwise negative with the exception of those mentioned in the HPI and as above.    PHYSICAL EXAM: Filed Vitals:   03/22/12 0910  BP: 133/73  Pulse: 76  Temp: 98.5 F (36.9 C)  Resp: 16   Filed Vitals:   03/22/12 0910  Height: 5' 6.5" (1.689 m)  Weight: 170 lb (77.111 kg)  Body mass index is 27.03 kg/(m^2).  General: Alert, no acute distress HEENT:  Normocephalic, atraumatic, oropharynx patent.  Cardiovascular:  Regular rate and rhythm, no rubs murmurs or gallops.    Respiratory: Clear to auscultation bilaterally.  No wheezes, rales, or rhonchi.  No cyanosis, no use of accessory musculature GI: No organomegaly, abdomen is soft and non-tender, positive bowel sounds.  No masses. Skin: No rashes. Neurologic: Facial musculature symmetric. Psychiatric: Patient is appropriate throughout our interaction. Lymphatic: No cervical lymphadenopathy Musculoskeletal: Gait intact.   LABS: Results for orders placed in visit on 03/12/12  IFOBT (OCCULT BLOOD)      Component Value  Range   IFOBT Positive       EKG/XRAY:   Primary read interpreted by Dr. Marin Comment at [facility].   ASSESSMENT/PLAN: Encounter Diagnosis  Name Primary?  . Constipation Yes   Rx Lactulose 30 ml BID Continue with Miralax May take senokot-s and dulcolax suppository to see if helps 3 day trial Increase vegetables and fiber IF no improvement then may do a trial of magcitrate F/u prn, go to ER if worsenign sxs ie n/v/abd distension.    Kimberlyn Quiocho, [place], DO 03/22/2012 9:31 AM

## 2012-03-25 ENCOUNTER — Telehealth: Payer: Self-pay

## 2012-03-25 NOTE — Telephone Encounter (Signed)
Patient returned call and has tried senokot, ducolax, warm baths, exercise, and Miralax but still no relief. Instructed him to continue Miralax but get Magnesium Citrate and if this does not work call us back and let us know. Patient voiced understanding.

## 2012-03-25 NOTE — Telephone Encounter (Signed)
PT STATES HE WAS TO CALL BACK IF NO BETTER AND HE IS STILL CONSTIPATED AND NOT GOING ON A REGULAR BASICS PLEASE CALL [phone]

## 2012-03-25 NOTE — Telephone Encounter (Signed)
Called patient left message to call me back, so I can find out what he has tried, Dr Marin Comment wants him to try magnesium citrate, if no relief with miralax

## 2012-03-28 ENCOUNTER — Ambulatory Visit (INDEPENDENT_AMBULATORY_CARE_PROVIDER_SITE_OTHER): Payer: BC Managed Care – PPO | Admitting: Family Medicine

## 2012-03-28 VITALS — BP 156/87 | HR 69 | Temp 98.1°F | Resp 16 | Ht 65.0 in | Wt 165.0 lb

## 2012-03-28 DIAGNOSIS — K6289 Other specified diseases of anus and rectum: Secondary | ICD-10-CM

## 2012-03-28 DIAGNOSIS — K644 Residual hemorrhoidal skin tags: Secondary | ICD-10-CM

## 2012-03-28 NOTE — Progress Notes (Signed)
Subjective: Patient has been here about 4 times in December with problems with constipation and a anal fissure. He's been treated with various laxatives, as well as with Anusol-HC followed by diltiazem gel. The last few days he is swelled up right swollen knot right at his anus. He had used the mag citrate the other day and sat on the commode for 4 hours. He has had some hemorrhoids in the past.  Objective: Abdomen soft and nontender. 1.5 CM hemorrhoids externally. On digital palpation I can feel a little pale of memory going up into the anal ring. Don't feel any significant size places internally.  Assessment: Hemorrhoids Constipation  Plan: Discussed options. Treat with the Anusol still for the next few days. Take the MiraLax and try to avoid prolonged straining. Drink lots of water. It is not doing better in the next few days off it's worse at anytime we will go ahead and lance it, though I prefer if it heals up on its own. Advised him to see his gastroenterologist back again fairly soon.

## 2012-03-28 NOTE — Patient Instructions (Signed)
Use the Anusol-HC cream. Drink lots of fluids. Use MiraLax. Return if worse.

## 2012-03-30 ENCOUNTER — Telehealth: Payer: Self-pay

## 2012-03-30 NOTE — Telephone Encounter (Signed)
Called patient. He states he is having increased pain from his hemorrhoids and I have advised him to come back in he states he is coming in tomorrow am first thing. He states ibuprofen is no relief and his pain is severe. He is using miralax, does still have some constipation. He also states he has a call into his GI Dr. Please advise.

## 2012-03-30 NOTE — Telephone Encounter (Signed)
Pt needs call back to discuss last visit. Please call [phone] cell or  May be reached at work # [phone]

## 2012-03-30 NOTE — Telephone Encounter (Signed)
Does he have any of the lidocaine gel left (Dr. Carlota Raspberry prescribed it at a previous visit).  If so, he should use it!  If not, RF it.

## 2012-03-30 NOTE — Telephone Encounter (Signed)
Pt stated that he has some gel left and he used some of that along w/a cream. Pt also took some 2 tabs ibuprofen which took a little of the edge off the pain. Pt does not want to take any more that 2 tabs at a time. Advised pt that he can try a dose of tylenol in between doses of the ibuprofen to see if that will control the pain any better, but not to take any more than the max of either allowed for the day. Pt agreed. Pt stated that when he took the lactulose last week it cleaned him out, but he has not had a BM since other than a small soft BM this morning the size of his pinky. He is very concerned about this and will be in the first thing in the morning.

## 2012-03-31 ENCOUNTER — Telehealth: Payer: Self-pay

## 2012-03-31 ENCOUNTER — Ambulatory Visit (INDEPENDENT_AMBULATORY_CARE_PROVIDER_SITE_OTHER): Payer: BC Managed Care – PPO | Admitting: Family Medicine

## 2012-03-31 ENCOUNTER — Encounter: Payer: Self-pay | Admitting: Family Medicine

## 2012-03-31 VITALS — BP 123/82 | HR 83 | Temp 98.2°F | Resp 16 | Ht 66.5 in | Wt 165.8 lb

## 2012-03-31 DIAGNOSIS — K644 Residual hemorrhoidal skin tags: Secondary | ICD-10-CM

## 2012-03-31 DIAGNOSIS — K6289 Other specified diseases of anus and rectum: Secondary | ICD-10-CM

## 2012-03-31 MED ORDER — HYDROCORTISONE 2.5 % RE CREA: TOPICAL_CREAM | Freq: Two times a day (BID) | RECTAL | Status: DC

## 2012-03-31 MED ORDER — CIPROFLOXACIN HCL 500 MG PO TABS: 500.0000 mg | ORAL_TABLET | Freq: Two times a day (BID) | ORAL | Status: DC

## 2012-03-31 NOTE — Progress Notes (Signed)
Subjective: Patient returns with ongoing problems from hemorrhoids. He has a lot of rectal pain. He has been passing small stools several times a day. Yesterday he had a couple of accidents where his stool came out and c he couldn't control it. He had to leave work. He has continued taking the MiraLax and using a rectal cream. I am not sure itch cream he is using, because he is not using the gel and I do not see a hydrocortisone cream prescribed although the report was given him. He has a lot of rectal pain.  Objective: Hemorrhoids still visible at about the 10:00 position. There is a small ulcerated cavity on the hemorrhoid. I do not palpate much thrombosed swelling in the hemorrhoid, just external soft tissue swelling that looks less that was the other day. It is erythematous.  Assessment: Hemorrhoids with complication small ulceration on the surface of it. The hemorrhoid is improving.  Plan: Switching to Metamucil and see how that does. He can take a stool softener with it if needed. Use Anusol-HC cream on the hemorrhoid a couple of times a day, and take Cipro twice daily. If he is not improving in 2 20 may want to get a gastroenterologist or surgical consult.  If he has further accidents with his stools and feels like he needs to take a couple days off from work he can call back in and we should give him a work excuse

## 2012-03-31 NOTE — Patient Instructions (Addendum)
Drink lots of water  Metamucil daily  Use miralax additionally only if needed for constipation. May use Colace stool softener if needed  Continue soaks  Use hydrocortisone cream   Take cipro twice daily for infection  Return if not improving  Take ibuprofen 600 mg 3 times daily.

## 2012-03-31 NOTE — Telephone Encounter (Signed)
Told him note is at front desk for pick up

## 2012-03-31 NOTE — Telephone Encounter (Signed)
PT WAS JUST SEEN BY DR HOPPER AND NEEDED HIM TO WRITE AN OOW NOTE FOR 3 DAYS AND HE WILL STOP BY AND P/U TODAY PLEASE CALL [phone] IF NEEDED

## 2012-04-04 ENCOUNTER — Telehealth: Payer: Self-pay

## 2012-04-04 DIAGNOSIS — K921 Melena: Secondary | ICD-10-CM

## 2012-04-04 NOTE — Telephone Encounter (Signed)
I spoke to patient about his situation, he can not get in to see his GI Dr. I have spoken to Butch Penny in Referrals she is going to try to get this done ASAP. If he can not get in with the GI doctor in the next few days, he needs to return here for recheck.

## 2012-04-04 NOTE — Telephone Encounter (Signed)
Pt states his condition is worse and he 'cannot live like this'. Going to the restroom 37 times a day and nothing but blood comes out - turning from bright red to dark red. Has lost 22 lbs in a month and not sleeping. Has seen dr hopper and is doing everything he's told to do. Wants to see dr guest tomorrow but in the mean time would like to know what to do.  Please call pt.   bf

## 2012-04-08 ENCOUNTER — Ambulatory Visit: Payer: BC Managed Care – PPO

## 2012-04-08 ENCOUNTER — Ambulatory Visit (INDEPENDENT_AMBULATORY_CARE_PROVIDER_SITE_OTHER): Payer: BC Managed Care – PPO | Admitting: Internal Medicine

## 2012-04-08 VITALS — BP 120/78 | HR 90 | Temp 98.9°F | Resp 20 | Ht 66.5 in | Wt 164.0 lb

## 2012-04-08 DIAGNOSIS — R609 Edema, unspecified: Secondary | ICD-10-CM

## 2012-04-08 DIAGNOSIS — M25571 Pain in right ankle and joints of right foot: Secondary | ICD-10-CM

## 2012-04-08 DIAGNOSIS — R600 Localized edema: Secondary | ICD-10-CM

## 2012-04-08 DIAGNOSIS — M25539 Pain in unspecified wrist: Secondary | ICD-10-CM

## 2012-04-08 DIAGNOSIS — J029 Acute pharyngitis, unspecified: Secondary | ICD-10-CM

## 2012-04-08 DIAGNOSIS — M25532 Pain in left wrist: Secondary | ICD-10-CM

## 2012-04-08 DIAGNOSIS — K5010 Crohn's disease of large intestine without complications: Secondary | ICD-10-CM | POA: Insufficient documentation

## 2012-04-08 DIAGNOSIS — K529 Noninfective gastroenteritis and colitis, unspecified: Secondary | ICD-10-CM

## 2012-04-08 DIAGNOSIS — M1990 Unspecified osteoarthritis, unspecified site: Secondary | ICD-10-CM

## 2012-04-08 DIAGNOSIS — M25579 Pain in unspecified ankle and joints of unspecified foot: Secondary | ICD-10-CM

## 2012-04-08 LAB — POCT URINALYSIS DIPSTICK
Leukocytes, UA: NEGATIVE
Nitrite, UA: NEGATIVE
Protein, UA: 30
Urobilinogen, UA: 0.2
pH, UA: 6

## 2012-04-08 LAB — POCT UA - MICROSCOPIC ONLY
Crystals, Ur, HPF, POC: NEGATIVE
Epithelial cells, urine per micros: NEGATIVE
Yeast, UA: NEGATIVE

## 2012-04-08 LAB — COMPREHENSIVE METABOLIC PANEL
ALT: 17 U/L (ref 0–53)
AST: 15 U/L (ref 0–37)
Alkaline Phosphatase: 80 U/L (ref 39–117)
Creat: 0.86 mg/dL (ref 0.50–1.35)
Total Bilirubin: 0.5 mg/dL (ref 0.3–1.2)

## 2012-04-08 LAB — POCT CBC
Granulocyte percent: 84.3 %G — AB (ref 37–80)
MCV: 99.2 fL — AB (ref 80–97)
MID (cbc): 1 — AB (ref 0–0.9)
POC LYMPH PERCENT: 9.4 %L — AB (ref 10–50)
Platelet Count, POC: 505 10*3/uL — AB (ref 142–424)
RDW, POC: 13.8 %

## 2012-04-08 LAB — POCT SEDIMENTATION RATE: POCT SED RATE: 73 mm/hr — AB (ref 0–22)

## 2012-04-08 LAB — RHEUMATOID FACTOR: Rhuematoid fact SerPl-aCnc: <10 IU/mL (ref <=14)

## 2012-04-08 MED ORDER — MELOXICAM 15 MG PO TABS: 15.0000 mg | ORAL_TABLET | Freq: Every day | ORAL | Status: DC

## 2012-04-08 MED ORDER — AMOXICILLIN 500 MG PO CAPS: 1000.0000 mg | ORAL_CAPSULE | Freq: Two times a day (BID) | ORAL | Status: AC

## 2012-04-08 NOTE — Progress Notes (Signed)
Subjective:    Patient ID: Phillip Glass, male    DOB: [birth year], 57 y.o.   MRN: [id number]  HPI Pt presents to clinic today c/o sore throat, neck pain, and swelling around his eyelids. He denies any cough.  ST started 3 d ago and has progressively worsened Fever possible tho tylenol this am and none now No chills or night sweats No PND Yesterday noted swelling R ankle w/ stiffness but no pain Today has swelling l wrist w/ pain on use Also notes swelling around eyes this am w/out vis changes 10lbs wt loss since 03/03/12  No hx arthritis recently developed acute onset of 20plus stools a day w/ cramping-no fever/no hematoch/has hx of "colitis" unclear kind treated intermittently for past 10 ys by  Dr Alonza Bogus [facility]/gets colonos q 4yr/saw him earlier this week--see multiple visits here since 12/13 Has never needed meds for long/no hx anemia/no hx rash or arthritis w/ prior flairs He will pick up "suppository tomorrow for current treatment plan(?name)  Review of Systems  Constitutional: Negative for chills, appetite change and unexpected weight change.       Last 24 hrs marked fatigue  HENT: Negative for congestion and rhinorrhea.   Eyes: Negative for photophobia, redness and visual disturbance.  Respiratory: Negative for cough and shortness of breath.   Cardiovascular: Negative for chest pain and palpitations.  Gastrointestinal: Negative for nausea.       Hemorrhoids  Genitourinary: Negative for dysuria, urgency, frequency, hematuria, discharge, difficulty urinating and genital sores.       No new partners or at risk behav  Musculoskeletal: Negative for back pain.  Skin: Negative for rash.  Neurological: Negative for headaches.  Hematological: Negative for adenopathy. Does not bruise/bleed easily.       Objective:   Physical Exam  Constitutional: He is oriented to person, place, and time. He appears well-developed. He appears distressed.  HENT:  Right Ear:  External ear normal.  Left Ear: External ear normal.  Nose: Nose normal.  Mouth/Throat: Oropharynx is clear and moist.  Eyes: Conjunctivae normal and EOM are normal. Pupils are equal, round, and reactive to light.  Neck: Neck supple. No thyromegaly present.  Cardiovascular: Normal rate, regular rhythm and intact distal pulses.  Exam reveals no gallop and no friction rub.   No murmur heard. Pulmonary/Chest: Effort normal and breath sounds normal. No respiratory distress. He has no wheezes. He has no rales. He exhibits no tenderness.  Abdominal: He exhibits no distension and no mass. There is no tenderness. There is no rebound and no guarding.  Musculoskeletal: He exhibits no edema.       L wrist swollen modersately w/out heat or erythema-pain mild with rom R ankle w/ effusion-not red or hot tender at med malleolus No other joints affected  Lymphadenopathy:    He has no cervical adenopathy.  Neurological: He is oriented to person, place, and time. He displays normal reflexes. No cranial nerve deficit.  Skin: Skin is warm and dry. No rash noted. No erythema.  Psychiatric: He has a normal mood and affect.       Results for orders placed in visit on 04/08/12  POCT CBC      Component Value Range   WBC 15.5 (*) 4.6 - 10.2 K/uL   Lymph, poc 1.5  0.6 - 3.4   POC LYMPH PERCENT 9.4 (*) 10 - 50 %L   MID (cbc) 1.0 (*) 0 - 0.9   POC MID % 6.3  0 - 12 %  M   POC Granulocyte 13.1 (*) 2 - 6.9   Granulocyte percent 84.3 (*) 37 - 80 %G   RBC 4.14 (*) 4.69 - 6.13 M/uL   Hemoglobin 13.2 (*) 14.1 - 18.1 g/dL   HCT, POC 41.1 (*) 43.5 - 53.7 %   MCV 99.2 (*) 80 - 97 fL   MCH, POC 31.9 (*) 27 - 31.2 pg   MCHC 32.1  31.8 - 35.4 g/dL   RDW, POC 13.8     Platelet Count, POC 505 (*) 142 - 424 K/uL   MPV 7.5  0 - 99.8 fL  POCT RAPID STREP A (OFFICE)      Component Value Range   Rapid Strep A Screen Negative  Negative  POCT UA - MICROSCOPIC ONLY      Component Value Range   WBC, Ur, HPF, POC 1-4      RBC, urine, microscopic 0-2     Bacteria, U Microscopic neg     Mucus, UA positive     Epithelial cells, urine per micros neg     Crystals, Ur, HPF, POC neg     Casts, Ur, LPF, POC neg     Yeast, UA neg    POCT URINALYSIS DIPSTICK      Component Value Range   Color, UA amber     Clarity, UA clear     Glucose, UA neg     Bilirubin, UA small     Ketones, UA >=160     Spec Grav, UA 1.015     Blood, UA trace-intact     pH, UA 6.0     Protein, UA 30     Urobilinogen, UA 0.2     Nitrite, UA neg     Leukocytes, UA Negative     ESR=75 [facility] reading (PRIMARY) by  Dr. Laney Pastor no destructive lesions in wrist or ankle   Assessment & Plan:  Pharyngitis leucocytosis Reactive arthritis-? Due to #4/not septic/not gout/NKI/unlikely CV dz Hx colitis-active  Cmet/thr cult/aso/rf/ana Note to GI Meloxicam 15 Amox til TC results/ASO OOW F/U mon at 5pm-sooner if worse  73mnOV

## 2012-04-10 LAB — CULTURE, GROUP A STREP: Organism ID, Bacteria: NORMAL

## 2012-04-11 ENCOUNTER — Ambulatory Visit (INDEPENDENT_AMBULATORY_CARE_PROVIDER_SITE_OTHER): Payer: BC Managed Care – PPO | Admitting: Internal Medicine

## 2012-04-11 VITALS — BP 133/76 | HR 100 | Temp 99.6°F | Resp 18 | Ht 67.0 in | Wt 164.0 lb

## 2012-04-11 DIAGNOSIS — K639 Disease of intestine, unspecified: Secondary | ICD-10-CM

## 2012-04-11 DIAGNOSIS — D72829 Elevated white blood cell count, unspecified: Secondary | ICD-10-CM

## 2012-04-11 DIAGNOSIS — J387 Other diseases of larynx: Secondary | ICD-10-CM

## 2012-04-11 DIAGNOSIS — D539 Nutritional anemia, unspecified: Secondary | ICD-10-CM

## 2012-04-11 DIAGNOSIS — R700 Elevated erythrocyte sedimentation rate: Secondary | ICD-10-CM

## 2012-04-11 LAB — POCT CBC
Granulocyte percent: 73.5 %G (ref 37–80)
HCT, POC: 38.3 % — AB (ref 43.5–53.7)
MCV: 101.1 fL — AB (ref 80–97)
MID (cbc): 0.8 (ref 0–0.9)
POC Granulocyte: 7.5 — AB (ref 2–6.9)
POC LYMPH PERCENT: 19 %L (ref 10–50)
Platelet Count, POC: 536 10*3/uL — AB (ref 142–424)
RBC: 3.79 M/uL — AB (ref 4.69–6.13)
RDW, POC: 14.1 %

## 2012-04-11 LAB — POCT SEDIMENTATION RATE: POCT SED RATE: 91 mm/hr — AB (ref 0–22)

## 2012-04-11 MED ORDER — PREDNISONE 20 MG PO TABS: ORAL_TABLET | ORAL | Status: DC

## 2012-04-11 MED ORDER — LIDOCAINE VISCOUS 2 % MT SOLN: OROMUCOSAL | Status: DC

## 2012-04-11 NOTE — Progress Notes (Deleted)
  Subjective:    Patient ID: Phillip Glass, male    DOB: Dec 29, 1955, 57 y.o.   MRN: [id number]  HPI    Review of Systems     Objective:   Physical Exam        Results for orders placed in visit on 04/11/12  POCT CBC      Component Value Range   WBC 10.2  4.6 - 10.2 K/uL   Lymph, poc 1.9  0.6 - 3.4   POC LYMPH PERCENT 19.0  10 - 50 %L   MID (cbc) 0.8  0 - 0.9   POC MID % 7.5  0 - 12 %M   POC Granulocyte 7.5 (*) 2 - 6.9   Granulocyte percent 73.5  37 - 80 %G   RBC 3.79 (*) 4.69 - 6.13 M/uL   Hemoglobin 11.3 (*) 14.1 - 18.1 g/dL   HCT, POC 38.3 (*) 43.5 - 53.7 %   MCV 101.1 (*) 80 - 97 fL   MCH, POC 29.8  27 - 31.2 pg   MCHC 29.5 (*) 31.8 - 35.4 g/dL   RDW, POC 14.1     Platelet Count, POC 536 (*) 142 - 424 K/uL   MPV 6.8  0 - 99.8 fL    Assessment & Plan:

## 2012-04-11 NOTE — Progress Notes (Signed)
  Subjective:    Patient ID: Phillip Glass, male    DOB: 1955-10-10, 57 y.o.   MRN: [id number]  HPI  F/u from 1/10. Not any better, not any worse. Wrist, ankle, and throat all feel the same. No fever or night sweats  Has a new complaint today. Has a knot on his upper, mid abd. A little tender to touch. No nausea or vomiting. No change in appetite. Still unable to eat well due to sore throat.  Labs from last visit were normal except sedimentation rate 75 and white blood cell count 15,000 Has been started on suppositories by [facility] at [facility] and they want him out til well  Review of Systems No chills or night sweats Weight loss has been at a few pounds No chest pain or palpitations No SOB No urinary sxtoms    Objective:   Physical Exam BP 133/76  Pulse 100  Temp 99.6 F (37.6 C) (Oral)  Resp 18  Ht 5' 7"  (1.702 m)  Wt 164 lb (74.39 kg)  BMI 25.69 kg/m2  SpO2 98% PERRLA/conjunc clear Nares boggy Throat now has multiple ulcers all over the soft palate and posterior pharynx Still no ac nodes Lungs are clear Ht-reg w/out M abd soft/no discernable mass in epigastrium but sl tender xiphoid No organomegaly L wrist remains swollen with painful range of motion/not read or hot Right ankle also remains swollen with effusion  Results for orders placed in visit on 04/11/12  POCT CBC      Component Value Range   WBC 10.2  4.6 - 10.2 K/uL   Lymph, poc 1.9  0.6 - 3.4   POC LYMPH PERCENT 19.0  10 - 50 %L   MID (cbc) 0.8  0 - 0.9   POC MID % 7.5  0 - 12 %M   POC Granulocyte 7.5 (*) 2 - 6.9   Granulocyte percent 73.5  37 - 80 %G   RBC 3.79 (*) 4.69 - 6.13 M/uL   Hemoglobin 11.3 (*) 14.1 - 18.1 g/dL   HCT, POC 38.3 (*) 43.5 - 53.7 %   MCV 101.1 (*) 80 - 97 fL   MCH, POC 29.8  27 - 31.2 pg   MCHC 29.5 (*) 31.8 - 35.4 g/dL   RDW, POC 14.1     Platelet Count, POC 536 (*) 142 - 424 K/uL   MPV 6.8  0 - 99.8 fL  POCT SEDIMENTATION RATE      Component Value Range   POCT SED  RATE 91 (*) 0 - 22 mm/hr        Assessment & Plan:    Problem # 1 Coxsackie pharyngitis Problem #2 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

## 2012-04-14 LAB — HERPES SIMPLEX VIRUS CULTURE: Organism ID, Bacteria: NOT DETECTED

## 2012-04-15 LAB — B12 AND FOLATE PANEL
Folate: 15.8 ng/mL (ref >3.0)
Vitamin B-12: >1999 pg/mL — ABNORMAL HIGH (ref 211–946)

## 2012-04-24 ENCOUNTER — Encounter: Payer: Self-pay | Admitting: Internal Medicine

## 2012-04-25 ENCOUNTER — Ambulatory Visit: Payer: BC Managed Care – PPO | Admitting: Family Medicine

## 2012-05-01 ENCOUNTER — Telehealth: Payer: Self-pay

## 2012-05-01 NOTE — Telephone Encounter (Signed)
NEEDS RX REFILL FOR PAIN MED TRAMADOL ACL 50 MG; NORMALLY SEES DOLITTLE   [phone]

## 2012-05-03 ENCOUNTER — Ambulatory Visit (INDEPENDENT_AMBULATORY_CARE_PROVIDER_SITE_OTHER): Payer: BC Managed Care – PPO | Admitting: Internal Medicine

## 2012-05-03 VITALS — BP 146/85 | HR 76 | Temp 98.2°F | Resp 16 | Ht 67.0 in | Wt 168.0 lb

## 2012-05-03 DIAGNOSIS — K6289 Other specified diseases of anus and rectum: Secondary | ICD-10-CM

## 2012-05-03 DIAGNOSIS — M1380 Other specified arthritis, unspecified site: Secondary | ICD-10-CM

## 2012-05-03 DIAGNOSIS — K602 Anal fissure, unspecified: Secondary | ICD-10-CM

## 2012-05-03 DIAGNOSIS — M1990 Unspecified osteoarthritis, unspecified site: Secondary | ICD-10-CM

## 2012-05-03 DIAGNOSIS — K5120 Ulcerative (chronic) proctitis without complications: Secondary | ICD-10-CM

## 2012-05-03 DIAGNOSIS — Z79899 Other long term (current) drug therapy: Secondary | ICD-10-CM

## 2012-05-03 DIAGNOSIS — M129 Arthropathy, unspecified: Secondary | ICD-10-CM

## 2012-05-03 DIAGNOSIS — Z7189 Other specified counseling: Secondary | ICD-10-CM

## 2012-05-03 MED ORDER — TRAMADOL HCL 50 MG PO TABS: 50.0000 mg | ORAL_TABLET | Freq: Four times a day (QID) | ORAL | Status: DC | PRN

## 2012-05-03 NOTE — Progress Notes (Signed)
  Subjective:    Patient ID: Phillip Glass, male    DOB: 03/22/1956, 57 y.o.   MRN: [id number]  HPI Recovering from severe attack of ulcerative proctitis with inflam bowel arthritis. On 3 powerful soppressive meds. Has pain. Still has slow to heal anal fissure. GI doc and surgeon in [place]. Needs RTW papers, rectum cked, and tramadol refilled.   Review of Systems     Objective:   Physical Exam  Constitutional: He appears well-developed and well-nourished.  Eyes: No scleral icterus.  Cardiovascular: Normal rate.   Pulmonary/Chest: Effort normal.  Abdominal: There is no tenderness.  Genitourinary:          Anal fissure and h           Assessment & Plan:  Ulcerative proctitis Inflammatory bowel arthropathy Pain rectum/joints Counsel on complex meds

## 2012-05-03 NOTE — Patient Instructions (Signed)
Ulcerative Colitis Ulcerative colitis is a long lasting swelling and soreness (inflammation) of the colon (large intestine). In patients with ulcerative colitis, sores (ulcers) and inflammation of the inner lining of the colon lead to illness. Ulcerative colitis can also cause problems outside the digestive tract.  Ulcerative colitis is closely related to another condition of inflammation of the intestines called Crohn's disease. Together, they are frequently referred to as inflammatory bowel disease (IBD). Ulcerative colitis and Crohn's diseases are conditions that can last years to decades. Men and women are affected equally. They most commonly begin during adolescence and early adulthood. SYMPTOMS  Common symptoms of ulcerative colitis include rectal bleeding and diarrhea. There is a wide range of symptoms among patients with this disease depending on how severe the disease is. Some of these symptoms are:  Abdominal pain or cramping.  Diarrhea.  Fever.  Tiredness (fatigue).  Weight loss.  Night sweats.  Rectal pain.  Feeling the immediate need to have a bowel movement (rectal urgency). CAUSES  Ulcerative colitis is caused by increased activity of the immune system in the intestines. The immune system is the system that protects the body against disease such as harmful bacteria, viruses, fungi, and other foreign invaders. When the immune system overacts, it causes inflammation. The cause of the increased immune system activity is not known. This over activity causes long-lasting inflammation and ulceration. This condition may be passed down from your parents (inherited). Brothers, sisters, children, and parents of patients with IBD are more likely to develop these diseases. It is not contagious. This means you cannot catch it from someone else. DIAGNOSIS  Your caregiver may suspect ulcerative colitis based on your symptoms and exam. Blood tests may confirm that there is a problem. You may  be asked to submit a stool specimen for examination. X-rays and CT scans may be necessary. Ultimately, the diagnosis is usually made after a flexible tube is inserted via your anus and your colon is examined under sedation (colonoscopy). With this test, the specialist can take a tiny tissue sample from inside the bowel (biopsy). Examination of this biopsy tissue under a microscopy can reveal ulcerative colitis as the cause of your symptoms. TREATMENT   There is no cure for ulcerative colitis.  Complications such as massive bleeding from the colon (hemorrhage), development of a hole in the colon (perforation), or the development of precancerous or cancerous changes of the colon may require surgery.  Medications are often used to decrease inflammation and control the immune system. These include medicines related to aspirin, steroid medications, and newer and stronger medications to slow down the immune system. Some medications may be used as suppositories or enemas. A number of other medications are used or have been studied. Your caregiver will make specific recommendations. HOME CARE INSTRUCTIONS   There is no cure for ulcerative colitis disease. The best treatment is frequent checkups with your caregiver. Periodic reevaluation is important.  Symptoms such as diarrhea can be controlled with medications. Avoid foods that have a laxative effect such fresh fruit and vegetables and dairy products. During flare ups, you can rest your bowel by staying away from solid foods. Drink clear liquids frequently during the day. Electrolyte or rehydrating fluids are best. Your caregiver can help you with suggestions. Drink often to prevent dehydration. When diarrhea has cleared, eat smaller meals and more often. Avoid food additives and stimulants such as caffeine (coffee, tea, many sodas, or chocolate). Avoid dairy products. Enzyme supplements may help if you develop intolerance to  a sugar in dairy products  (lactose). Ask your caregiver or dietitian about specific dietary instructions.  If you had surgery, be sure you understand your care instructions thoroughly, including proper care of any surgical wounds.  Take any medications exactly as prescribed.  Try to maintain a positive attitude. Learn relaxation techniques such as self hypnosis, mental imaging, and muscle relaxation. If possible, avoid stresses that aggravate your condition. Exercise regularly. Follow your diet. Always get plenty of rest. SEEK MEDICAL CARE IF:   Your symptoms fail to improve after a week or two of new treatment.  You experience continued weight loss.  You have ongoing crampy digestion or loose bowels.  You develop a new skin rash, skin sores, or eye problems. SEEK IMMEDIATE MEDICAL CARE IF:   You have worsening of your symptoms or develop new symptoms.  You have an oral temperature above 102 F (38.9 C), not controlled by medicine.  You develop bloody diarrhea.  You have severe abdominal pain. Document Released: 12/24/2004 Document Revised: 06/08/2011 Document Reviewed: 11/23/2006 [facility] Patient Information 2013 [place].

## 2012-05-04 NOTE — Telephone Encounter (Signed)
Gone til 2/9

## 2012-05-04 NOTE — Telephone Encounter (Signed)
Dr. Elder Cyphers refilled this on 05/03/12

## 2012-05-06 ENCOUNTER — Ambulatory Visit (INDEPENDENT_AMBULATORY_CARE_PROVIDER_SITE_OTHER): Payer: BC Managed Care – PPO | Admitting: Family Medicine

## 2012-05-06 VITALS — BP 163/80 | HR 75 | Temp 98.0°F | Resp 16 | Ht 67.0 in | Wt 170.0 lb

## 2012-05-06 DIAGNOSIS — R109 Unspecified abdominal pain: Secondary | ICD-10-CM

## 2012-05-06 DIAGNOSIS — D539 Nutritional anemia, unspecified: Secondary | ICD-10-CM

## 2012-05-06 DIAGNOSIS — R195 Other fecal abnormalities: Secondary | ICD-10-CM

## 2012-05-06 LAB — POCT CBC
HCT, POC: 36.4 % — AB (ref 43.5–53.7)
Hemoglobin: 11.3 g/dL — AB (ref 14.1–18.1)
MCH, POC: 31.7 pg — AB (ref 27–31.2)
MCV: 102.1 fL — AB (ref 80–97)
MID (cbc): 0.5 (ref 0–0.9)
RBC: 3.57 M/uL — AB (ref 4.69–6.13)
WBC: 11.8 10*3/uL — AB (ref 4.6–10.2)

## 2012-05-06 NOTE — Patient Instructions (Addendum)
Followup Tuesday with your gastroenterologist. Have his nurse call the office to get the results of the ferritin if they have not gotten it.  If you develop worse pain or fever or frank bleeding come back or go to the emergency room

## 2012-05-06 NOTE — Progress Notes (Signed)
Subjective: Patient been having problems with some abdominal discomfort today. It is a bloating hurting pain in the left upper abdomen. He also had a dark stool. Then after that he had brown-colored stool. Does have a history of colitis, and is scheduled to see his gastroenterologist in [facility] on Tuesday. He called the gastroenterologist office, and they suggested he come in to get checked. He was depressed. He was just had one thing after the next the last month or so and says he wishes to always being healthy and has just been very hard on him.  Objective: Chest clear. Heart regular. Abdomen has normal bowel sounds. Soft without organomegaly or masses. Actually seemed more ticklish than painful. He did have hemorrhoids and digital exam was uncomfortable. There is only minimal stool in the rectal mucus, and this was hemocculted. It is positive.   Results for orders placed in visit on 05/06/12  POCT CBC      Component Value Range   WBC 11.8 (*) 4.6 - 10.2 K/uL   Lymph, poc 1.3  0.6 - 3.4   POC LYMPH PERCENT 10.8  10 - 50 %L   MID (cbc) 0.5  0 - 0.9   POC MID % 3.9  0 - 12 %M   POC Granulocyte 10.1 (*) 2 - 6.9   Granulocyte percent 85.3 (*) 37 - 80 %G   RBC 3.57 (*) 4.69 - 6.13 M/uL   Hemoglobin 11.3 (*) 14.1 - 18.1 g/dL   HCT, POC 36.4 (*) 43.5 - 53.7 %   MCV 102.1 (*) 80 - 97 fL   MCH, POC 31.7 (*) 27 - 31.2 pg   MCHC 31.0 (*) 31.8 - 35.4 g/dL   RDW, POC 15.6     Platelet Count, POC 372  142 - 424 K/uL   MPV 6.8  0 - 99.8 fL  IFOBT (OCCULT BLOOD)      Component Value Range   IFOBT Positive     Assessment: Occult rectal bleeding Dark stools Nonspecific abdominal pain Leukocytosis, probably from being on the prednisone  Plan: Tried to reassure him. I did not give him any new medications today. I think it is okay for him to take some Pepto-Bismol, but if he suspects frank bleeding at any time he get rechecked. He should keep his point with his gastroenterologist. I think that's  his prednisone dose is taken down he will feel better.

## 2012-05-19 ENCOUNTER — Encounter: Payer: Self-pay | Admitting: Family Medicine

## 2012-05-24 ENCOUNTER — Telehealth: Payer: Self-pay

## 2012-05-24 NOTE — Telephone Encounter (Signed)
DISREGARD THIS

## 2013-01-19 ENCOUNTER — Ambulatory Visit (INDEPENDENT_AMBULATORY_CARE_PROVIDER_SITE_OTHER): Payer: BC Managed Care – PPO | Admitting: Emergency Medicine

## 2013-01-19 VITALS — BP 134/74 | HR 76 | Temp 98.8°F | Resp 16 | Ht 66.5 in | Wt 178.2 lb

## 2013-01-19 DIAGNOSIS — R290 Tetany: Secondary | ICD-10-CM

## 2013-01-19 DIAGNOSIS — R252 Cramp and spasm: Secondary | ICD-10-CM

## 2013-01-19 LAB — POCT CBC
Granulocyte percent: 87.1 %G — AB (ref 37–80)
HCT, POC: 41.7 % — AB (ref 43.5–53.7)
Hemoglobin: 12.7 g/dL — AB (ref 14.1–18.1)
Lymph, poc: 0.6 (ref 0.6–3.4)
MCHC: 30.5 g/dL — AB (ref 31.8–35.4)
MCV: 107.6 fL — AB (ref 80–97)
MID (cbc): 0.3 (ref 0–0.9)
MPV: 7.2 fL (ref 0–99.8)
POC Granulocyte: 6.4 (ref 2–6.9)
POC LYMPH PERCENT: 8.6 %L — AB (ref 10–50)
POC MID %: 4.3 %M (ref 0–12)
RDW, POC: 16.7 %

## 2013-01-19 LAB — COMPREHENSIVE METABOLIC PANEL
Albumin: 4 g/dL (ref 3.5–5.2)
BUN: 22 mg/dL (ref 6–23)
CO2: 31 mEq/L (ref 19–32)
Calcium: 9.3 mg/dL (ref 8.4–10.5)
Glucose, Bld: 120 mg/dL — ABNORMAL HIGH (ref 70–99)
Potassium: 5.4 mEq/L — ABNORMAL HIGH (ref 3.5–5.3)
Sodium: 138 mEq/L (ref 135–145)
Total Bilirubin: 0.4 mg/dL (ref 0.3–1.2)
Total Protein: 6.5 g/dL (ref 6.0–8.3)

## 2013-01-19 LAB — CK: Total CK: 43 U/L (ref 7–232)

## 2013-01-19 NOTE — Progress Notes (Signed)
[facility] [address] [place] [address] [phone]  Date:  01/19/2013   Name:  CHANDRA FEGER   DOB:  04-08-1955   MRN:  [id number]  PCP:  Reginia Forts, MD    Chief Complaint: Hand Pain   History of Present Illness:  GYASI HAZZARD is a 57 y.o. very pleasant male patient who presents with the following:  Says that he had a spasm in his left fourth and fifth fingers that recurred yesterday and lasted for minutes.  Not associated with paresthesias or weakness of the hand.  No associated neuro or visual or speech disorders.  No headache.  No antecedent illness or injury.  No improvement with over the counter medications or other home remedies. Denies other complaint or health concern today. Resolved spontaeously and has not recurred today.  Patient Active Problem List   Diagnosis Date Noted  . Colitis 04/08/2012  . Routine general medical examination at a health care facility 03/03/2012  . Multiple nevi 03/03/2012    Past Medical History  Diagnosis Date  . Colitis 03/30/1978  . Colitis     Past Surgical History  Procedure Laterality Date  . Colonoscopy  01/12/2011  . Tonsillectomy      History  Substance Use Topics  . Smoking status: Never Smoker   . Smokeless tobacco: Never Used  . Alcohol Use: No    Family History  Problem Relation Age of Onset  . Hypertension Father   . Heart disease Father 43    AMI age 54; second  AMI age 64 cause of death  . Diabetes Sister   . Dementia Mother   . Hypertension Mother     Allergies  Allergen Reactions  . Codeine Other (See Comments)    Pt states he hallucinates    Medication list has been reviewed and updated.  Current Outpatient Prescriptions on File Prior to Visit  Medication Sig Dispense Refill  . balsalazide (COLAZAL) 750 MG capsule Take 4,500 mg by mouth 2 (two) times daily.      Marland Kitchen Bioflavonoid Products (BIOFLEX PO) Take 1 tablet by mouth daily.      Marland Kitchen diltiazem 2 % GEL Apply  topically 2 (two) times daily.      . Garlic 630 MG TABS Take 1 tablet by mouth daily.      . mesalamine (CANASA) 1000 MG suppository Place 1,000 mg rectally at bedtime.      . Multiple Vitamins-Minerals (MULTIVITAMIN WITH MINERALS) tablet Take 1 tablet by mouth daily.      . predniSONE (DELTASONE) 10 MG tablet Take 10 mg by mouth 3 (three) times daily.      . Aloe Vera 500 MG CAPS Take 1 capsule by mouth daily.      Marland Kitchen guaiFENesin (MUCINEX) 600 MG 12 hr tablet Take 1,200 mg by mouth 2 (two) times daily.      . hydrocortisone (ANUSOL-HC) 2.5 % rectal cream Place rectally 2 (two) times daily.  30 g  0  . lidocaine (XYLOCAINE) 2 % solution Gargle 1tsp q 2h as needed  100 mL  0  . meloxicam (MOBIC) 15 MG tablet Take 1 tablet (15 mg total) by mouth daily.  30 tablet  0  . predniSONE (DELTASONE) 20 MG tablet 3/3/3/2/2/2/1/1/1 single daily dose for 9 days  18 tablet  0  . Specialty Vitamins Products (ONE-A-DAY CHOLESTEROL) TABS Take 1 tablet by mouth daily.      . traMADol (ULTRAM) 50 MG tablet Take  1 tablet (50 mg total) by mouth every 6 (six) hours as needed for pain.  30 tablet  2   No current facility-administered medications on file prior to visit.    Review of Systems:  As per HPI, otherwise negative.    Physical Examination: Filed Vitals:   01/19/13 1541  BP: 134/74  Pulse: 76  Temp: 98.8 F (37.1 C)  Resp: 16   Filed Vitals:   01/19/13 1541  Height: 5' 6.5" (1.689 m)  Weight: 178 lb 3.2 oz (80.831 kg)   Body mass index is 28.33 kg/(m^2). Ideal Body Weight: Weight in (lb) to have BMI = 25: 156.9  GEN: WDWN, NAD, Non-toxic, A & O x 3 HEENT: Atraumatic, Normocephalic. Neck supple. No masses, No LAD. Ears and Nose: No external deformity. CV: RRR, No M/G/R. No JVD. No thrill. No extra heart sounds. PULM: CTA B, no wheezes, crackles, rhonchi. No retractions. No resp. distress. No accessory muscle use. ABD: S, NT, ND, +BS. No rebound. No HSM. EXTR: No c/c/e NEURO Normal gait.   PSYCH: Normally interactive. Conversant. Not depressed or anxious appearing.  Calm demeanor.    Assessment and Plan: Hand spasm  Labs Follow up with FMD next week  Signed,  Ellison Carwin, MD

## 2013-01-20 ENCOUNTER — Telehealth: Payer: Self-pay

## 2013-01-20 NOTE — Telephone Encounter (Signed)
Called, labs negative.

## 2013-01-20 NOTE — Telephone Encounter (Signed)
Returning lab call. [phone] (cell) or [phone].

## 2013-12-18 ENCOUNTER — Ambulatory Visit (INDEPENDENT_AMBULATORY_CARE_PROVIDER_SITE_OTHER): Payer: BC Managed Care – PPO | Admitting: Family Medicine

## 2013-12-18 VITALS — BP 130/80 | HR 78 | Temp 98.0°F | Resp 17 | Ht 67.0 in | Wt 176.0 lb

## 2013-12-18 DIAGNOSIS — M25476 Effusion, unspecified foot: Secondary | ICD-10-CM

## 2013-12-18 DIAGNOSIS — R5383 Other fatigue: Secondary | ICD-10-CM

## 2013-12-18 DIAGNOSIS — D649 Anemia, unspecified: Secondary | ICD-10-CM

## 2013-12-18 DIAGNOSIS — R5381 Other malaise: Secondary | ICD-10-CM

## 2013-12-18 DIAGNOSIS — M2550 Pain in unspecified joint: Secondary | ICD-10-CM

## 2013-12-18 DIAGNOSIS — M25472 Effusion, left ankle: Secondary | ICD-10-CM

## 2013-12-18 DIAGNOSIS — M25473 Effusion, unspecified ankle: Secondary | ICD-10-CM

## 2013-12-18 LAB — COMPLETE METABOLIC PANEL WITH GFR
ALT: 17 U/L (ref 0–53)
AST: 21 U/L (ref 0–37)
Albumin: 3.8 g/dL (ref 3.5–5.2)
Alkaline Phosphatase: 95 U/L (ref 39–117)
CO2: 27 mEq/L (ref 19–32)
Calcium: 9 mg/dL (ref 8.4–10.5)
Chloride: 104 mEq/L (ref 96–112)
Creat: 1.11 mg/dL (ref 0.50–1.35)
GFR, Est African American: 85 mL/min
Glucose, Bld: 92 mg/dL (ref 70–99)
Potassium: 4.5 mEq/L (ref 3.5–5.3)
Sodium: 141 mEq/L (ref 135–145)
Total Protein: 6.7 g/dL (ref 6.0–8.3)

## 2013-12-18 LAB — POCT CBC
Granulocyte percent: 75.9 %G (ref 37–80)
HCT, POC: 31.3 % — AB (ref 43.5–53.7)
Hemoglobin: 9.9 g/dL — AB (ref 14.1–18.1)
Lymph, poc: 1.1 (ref 0.6–3.4)
MCH, POC: 28 pg (ref 27–31.2)
MCHC: 31.7 g/dL — AB (ref 31.8–35.4)
MCV: 88.4 fL (ref 80–97)
MID (cbc): 0.7 (ref 0–0.9)
MPV: 5.8 fL (ref 0–99.8)
POC Granulocyte: 5.8 (ref 2–6.9)
POC LYMPH PERCENT: 14.3 % (ref 10–50)
POC MID %: 9.8 %M (ref 0–12)
Platelet Count, POC: 502 10*3/uL — AB (ref 142–424)
RBC: 3.54 M/uL — AB (ref 4.69–6.13)
RDW, POC: 16.7 %
WBC: 7.6 10*3/uL (ref 4.6–10.2)

## 2013-12-18 LAB — IBC PANEL
%SAT: 7 % — ABNORMAL LOW (ref 20–55)
TIBC: 364 ug/dL (ref 215–435)
UIBC: 339 ug/dL (ref 125–400)

## 2013-12-18 LAB — IRON: Iron: 25 ug/dL — ABNORMAL LOW (ref 42–165)

## 2013-12-18 LAB — COMPLETE METABOLIC PANEL WITHOUT GFR
BUN: 16 mg/dL (ref 6–23)
GFR, Est Non African American: 73 mL/min
Total Bilirubin: 0.3 mg/dL (ref 0.2–1.2)

## 2013-12-18 LAB — FERRITIN: Ferritin: 7 ng/mL — ABNORMAL LOW (ref 22–322)

## 2013-12-18 LAB — POCT SEDIMENTATION RATE: POCT SED RATE: 63 mm/hr — AB (ref 0–22)

## 2013-12-18 MED ORDER — TRAMADOL HCL 50 MG PO TABS: 50.0000 mg | ORAL_TABLET | Freq: Three times a day (TID) | ORAL | Status: DC | PRN

## 2013-12-18 NOTE — Progress Notes (Signed)
Chief Complaint:  Chief Complaint  Patient presents with  . Fatigue  . Ankle Pain  . Knee Pain  . Elbow Injury  . Wrist Pain    HPI: Phillip Glass is a 58 y.o. male who is here for left ankle swelling for the last one week associated with pain, NKI, he has also other pains as well including bilateral wrist pain that is more inconsistent.. The pain he feels is similar to what he was feeling when he last saw Dr Laney Pastor and was given mobic in 03/2012 which helped a lot . He has had a workup for joint pain were normal, uric acid was low,  ANA and RA were normal . His ESR was elevated but he had colitis at that time. His colitis is under control now , he has some blood in his stool but not like what a flare up would be like. He states that he is a Freight forwarder at a [employer] and his supervisor their is a overworking him and he does not like working there. HE is working anywhere from 40-60 hour days and he is tired of it. He will be in training for another site and he can't wait to get out. He will be resting his akle this week.   He has had no fevers or chills, warmth or skin changes to the ankle. Prior right ankle xray from 03/2012 was done along with all the jt pain workup was also unremarkable.  He has has anemia before but usually Hgb is 12 . Last Ferritin was 75  CBC Latest Ref Rng 12/18/2013 01/19/2013 05/06/2012  WBC 4.6 - 10.2 K/uL 7.6 7.3 11.8(A)  Hemoglobin 14.1 - 18.1 g/dL 9.9(A) 12.7(A) 11.3(A)  Hematocrit 43.5 - 53.7 % 31.3(A) 41.7(A) 36.4(A)      Past Medical History  Diagnosis Date  . Colitis 03/30/1978  . Colitis    Past Surgical History  Procedure Laterality Date  . Colonoscopy  01/12/2011  . Tonsillectomy     History   Social History  . Marital Status: Legally Separated    Spouse Name: N/A    Number of Children: N/A  . Years of Education: N/A   Social History Main Topics  . Smoking status: Never Smoker   . Smokeless tobacco: Never Used  . Alcohol Use: No    . Drug Use: No  . Sexual Activity: No   Other Topics Concern  . None   Social History Narrative   Marital status: divorced in 2013.  Not dating.  Married x 20 years.      Children:  1 child (22); no grandchild.      Lives: with son.      Employment: Scientist, clinical (histocompatibility and immunogenetics) x 35 years; happy.      Tobacco; none      Alcohol: none      Drugs; none      Exercise:  6 days per week for 45 minutes.  Cardio twice weekly; weightlifting 5 days per week.      Seatbelt: 100%      Guns:  Locked and loaded.   Family History  Problem Relation Age of Onset  . Hypertension Father   . Heart disease Father 44    AMI age 80; second  AMI age 51 cause of death  . Diabetes Sister   . Dementia Mother   . Hypertension Mother    Allergies  Allergen Reactions  . Codeine Other (See Comments)    Pt  states he hallucinates   Prior to Admission medications   Medication Sig Start Date End Date Taking? Authorizing Provider  Aloe Vera 500 MG CAPS Take 1 capsule by mouth daily.   Yes Historical Provider, MD  balsalazide (COLAZAL) 750 MG capsule Take 4,500 mg by mouth 2 (two) times daily.   Yes Historical Provider, MD  Bioflavonoid Products (BIOFLEX PO) Take 1 tablet by mouth daily.   Yes Historical Provider, MD  diltiazem 2 % GEL Apply topically 2 (two) times daily.   Yes Historical Provider, MD  Garlic 703 MG TABS Take 1 tablet by mouth daily.   Yes Historical Provider, MD  guaiFENesin (MUCINEX) 600 MG 12 hr tablet Take 1,200 mg by mouth 2 (two) times daily.   Yes Historical Provider, MD  meloxicam (MOBIC) 15 MG tablet Take 1 tablet (15 mg total) by mouth daily. 04/08/12  Yes Leandrew Koyanagi, MD  mesalamine (CANASA) 1000 MG suppository Place 1,000 mg rectally at bedtime.   Yes Historical Provider, MD  Multiple Vitamins-Minerals (MULTIVITAMIN WITH MINERALS) tablet Take 1 tablet by mouth daily.   Yes Historical Provider, MD  Specialty Vitamins Products (ONE-A-DAY CHOLESTEROL) TABS Take 1 tablet by mouth daily.    Yes Historical Provider, MD  traMADol (ULTRAM) 50 MG tablet Take 1 tablet (50 mg total) by mouth every 6 (six) hours as needed for pain. 05/03/12  Yes Orma Flaming, MD     ROS: The patient denies fevers, chills, night sweats, unintentional weight loss, chest pain, palpitations, wheezing, dyspnea on exertion, nausea, vomiting, abdominal pain, dysuria, hematuria, melena, numbness, weakness, or tingling.  All other systems have been reviewed and were otherwise negative with the exception of those mentioned in the HPI and as above.    PHYSICAL EXAM: Filed Vitals:   12/18/13 0839  BP: 130/80  Pulse: 78  Temp: 98 F (36.7 C)  Resp: 17   Filed Vitals:   12/18/13 0839  Height: 5' 7"  (1.702 m)  Weight: 176 lb (79.833 kg)   Body mass index is 27.56 kg/(m^2).  General: Alert, no acute distress HEENT:  Normocephalic, atraumatic, oropharynx patent. EOMI, PERRLA Cardiovascular:  Regular rate and rhythm, no rubs murmurs or gallops.  No Carotid bruits, radial pulse intact. No pedal edema.  Respiratory: Clear to auscultation bilaterally.  No wheezes, rales, or rhonchi.  No cyanosis, no use of accessory musculature GI: No organomegaly, abdomen is soft and non-tender, positive bowel sounds.  No masses. Skin: No rashes. Neurologic: Facial musculature symmetric. Psychiatric: Patient is appropriate throughout our interaction. Lymphatic: No cervical lymphadenopathy Musculoskeletal: Gait intact. Right ankle -+ minimal swelling, tender, full ROM. + DP   LABS: Results for orders placed in visit on 12/18/13  POCT CBC      Result Value Ref Range   WBC 7.6  4.6 - 10.2 K/uL   Lymph, poc 1.1  0.6 - 3.4   POC LYMPH PERCENT 14.3  10 - 50 %L   MID (cbc) 0.7  0 - 0.9   POC MID % 9.8  0 - 12 %M   POC Granulocyte 5.8  2 - 6.9   Granulocyte percent 75.9  37 - 80 %G   RBC 3.54 (*) 4.69 - 6.13 M/uL   Hemoglobin 9.9 (*) 14.1 - 18.1 g/dL   HCT, POC 31.3 (*) 43.5 - 53.7 %   MCV 88.4  80 - 97 fL   MCH, POC  28.0  27 - 31.2 pg   MCHC 31.7 (*) 31.8 - 35.4 g/dL  RDW, POC 16.7     Platelet Count, POC 502 (*) 142 - 424 K/uL   MPV 5.8  0 - 99.8 fL     EKG/XRAY:   Primary read interpreted by Dr. Marin Comment at [facility].   ASSESSMENT/PLAN: Encounter Diagnoses  Name Primary?  . Left ankle swelling Yes  . Joint pain   . Anemia, unspecified anemia type    Called Dr Merlene Pulling @ [facility] to try to get last Hgb, his nurse states that it was not as low as 9.9 .  Will get anemia work up initially with iron studies. He is not currently bleeding like he normally does with colitis flare up. He has no CP or SOB. No NSAIDS for now until why he is anemic, will try tramadol able to tolerate.  F/u prn labs iron studies pending  Gross sideeffects, risk and benefits, and alternatives of medications d/w patient. Patient is aware that all medications have potential sideeffects and we are unable to predict every sideeffect or drug-drug interaction that may occur.  Senya Hinzman, [place], DO 12/18/2013 9:45 AM   12/20/2013 Spoke with patient about labs, need to return in 1 month to get Hgb rechecked, sooner if have CP/SOB/dizziness. Will do a short trial of mobic to get him through this arthritic pain attack, he does not want prednisone makes him feel wrose not better. I will also put in  CBC to be done in 1 week after he does the mobic trial for his ankle to see if his HGb has dropped.

## 2013-12-20 ENCOUNTER — Telehealth: Payer: Self-pay | Admitting: *Deleted

## 2013-12-20 DIAGNOSIS — M25579 Pain in unspecified ankle and joints of unspecified foot: Secondary | ICD-10-CM

## 2013-12-20 MED ORDER — MELOXICAM 15 MG PO TABS: 15.0000 mg | ORAL_TABLET | Freq: Every day | ORAL | Status: DC

## 2013-12-20 NOTE — Telephone Encounter (Signed)
Spoke to patient about labs, iron is low, advise to take iron 325 mg BID and then return in 1 month for iron studies. He will be prescribed mobic 15 mg daily with food for 1 week.

## 2013-12-20 NOTE — Telephone Encounter (Signed)
Pt called wanted to get lab results.  Pt states that his knees are both swelling. He is unable to get his shoes on. He states that the medication is not working. He is not able to miss work. He does not have the money to miss work. He is able to come in tomorrow morning. Please advise if the medication he was prescribed last time this occurred. Reviewed chart and last occurrence Dr. Laney Pastor prescribed Prednisone for him. Please advise if this can be called into his pharmacy.   Call pt on cell [phone]

## 2013-12-21 ENCOUNTER — Encounter: Payer: Self-pay | Admitting: Family Medicine

## 2013-12-24 ENCOUNTER — Telehealth: Payer: Self-pay

## 2013-12-24 NOTE — Telephone Encounter (Signed)
Dr. Marin Comment   Patients states he is no better, was seen on Monday.  He would like some relief.    Please call  [phone]

## 2013-12-25 ENCOUNTER — Telehealth: Payer: Self-pay | Admitting: *Deleted

## 2013-12-25 ENCOUNTER — Ambulatory Visit (INDEPENDENT_AMBULATORY_CARE_PROVIDER_SITE_OTHER): Payer: BC Managed Care – PPO | Admitting: Emergency Medicine

## 2013-12-25 VITALS — BP 128/70 | HR 82 | Temp 98.1°F | Resp 16 | Ht 66.0 in | Wt 174.0 lb

## 2013-12-25 DIAGNOSIS — D649 Anemia, unspecified: Secondary | ICD-10-CM

## 2013-12-25 DIAGNOSIS — R600 Localized edema: Secondary | ICD-10-CM

## 2013-12-25 DIAGNOSIS — M2550 Pain in unspecified joint: Secondary | ICD-10-CM

## 2013-12-25 DIAGNOSIS — R609 Edema, unspecified: Secondary | ICD-10-CM

## 2013-12-25 LAB — COMPREHENSIVE METABOLIC PANEL
ALBUMIN: 3.6 g/dL (ref 3.5–5.2)
ALT: 17 U/L (ref 0–53)
AST: 16 U/L (ref 0–37)
Alkaline Phosphatase: 84 U/L (ref 39–117)
BUN: 16 mg/dL (ref 6–23)
CO2: 27 meq/L (ref 19–32)
Calcium: 8.6 mg/dL (ref 8.4–10.5)
Chloride: 103 mEq/L (ref 96–112)
Creat: 0.97 mg/dL (ref 0.50–1.35)
GLUCOSE: 94 mg/dL (ref 70–99)
POTASSIUM: 4.3 meq/L (ref 3.5–5.3)
SODIUM: 140 meq/L (ref 135–145)
TOTAL PROTEIN: 6.2 g/dL (ref 6.0–8.3)
Total Bilirubin: 0.3 mg/dL (ref 0.2–1.2)

## 2013-12-25 LAB — POCT CBC
GRANULOCYTE PERCENT: 66.6 % (ref 37–80)
HEMATOCRIT: 28.2 % — AB (ref 43.5–53.7)
HEMOGLOBIN: 8.5 g/dL — AB (ref 14.1–18.1)
Lymph, poc: 1.2 (ref 0.6–3.4)
MCH, POC: 27.5 pg (ref 27–31.2)
MCHC: 30.1 g/dL — AB (ref 31.8–35.4)
MCV: 91.3 fL (ref 80–97)
MID (cbc): 0.7 (ref 0–0.9)
MPV: 6 fL (ref 0–99.8)
POC Granulocyte: 3.8 (ref 2–6.9)
POC LYMPH PERCENT: 21.9 %L (ref 10–50)
POC MID %: 11.5 % (ref 0–12)
Platelet Count, POC: 530 10*3/uL — AB (ref 142–424)
RBC: 3.09 M/uL — AB (ref 4.69–6.13)
RDW, POC: 17.3 %
WBC: 5.7 10*3/uL (ref 4.6–10.2)

## 2013-12-25 LAB — POCT SEDIMENTATION RATE: POCT SED RATE: 72 mm/h — AB (ref 0–22)

## 2013-12-25 LAB — RHEUMATOID FACTOR: Rhuematoid fact SerPl-aCnc: <10 IU/mL (ref <=14)

## 2013-12-25 MED ORDER — TRAMADOL HCL 50 MG PO TABS: 50.0000 mg | ORAL_TABLET | Freq: Three times a day (TID) | ORAL | Status: DC | PRN

## 2013-12-25 MED ORDER — HYDROCHLOROTHIAZIDE 25 MG PO TABS
25.0000 mg | ORAL_TABLET | Freq: Every day | ORAL | Status: DC
Start: 2013-12-25 — End: 2014-07-11

## 2013-12-25 NOTE — Telephone Encounter (Signed)
Left message patient needs to come back in and talk with dr. Ouida Sills today.

## 2013-12-25 NOTE — Progress Notes (Signed)
[facility] [address] [place] [address] [phone]  Date:  12/25/2013   Name:  Phillip Glass   DOB:  1955/11/09   MRN:  [id number]  PCP:  Reginia Forts, MD    Chief Complaint: Joint Swelling   History of Present Illness:  Phillip Glass is a 58 y.o. very pleasant male patient who presents with the following:  Patient was seen last week by Dr Marin Comment with peripheral edema and joint pains.  Was treated with mobic previously and she added tramadol He says the joint pains and swelling are worse now.  No chest pain, shortness of breath, wheezing or nausea or vomiting. Denies blood in stool or black stools.  No dizziness, weakness or fatigue. History of ulcerative colitis and denies diarrhea. No improvement with over the counter medications or other home remedies. Denies other complaint or health concern today.   Patient Active Problem List   Diagnosis Date Noted  . Colitis 04/08/2012  . Routine general medical examination at a health care facility 03/03/2012  . Multiple nevi 03/03/2012    Past Medical History  Diagnosis Date  . Colitis 03/30/1978  . Colitis     Past Surgical History  Procedure Laterality Date  . Colonoscopy  01/12/2011  . Tonsillectomy      History  Substance Use Topics  . Smoking status: Never Smoker   . Smokeless tobacco: Never Used  . Alcohol Use: No    Family History  Problem Relation Age of Onset  . Hypertension Father   . Heart disease Father 64    AMI age 28; second  AMI age 80 cause of death  . Diabetes Sister   . Dementia Mother   . Hypertension Mother     Allergies  Allergen Reactions  . Codeine Other (See Comments)    Pt states he hallucinates    Medication list has been reviewed and updated.  Current Outpatient Prescriptions on File Prior to Visit  Medication Sig Dispense Refill  . Aloe Vera 500 MG CAPS Take 1 capsule by mouth daily.      . balsalazide (COLAZAL) 750 MG capsule Take 4,500 mg by  mouth 2 (two) times daily.      Marland Kitchen Bioflavonoid Products (BIOFLEX PO) Take 1 tablet by mouth daily.      Marland Kitchen diltiazem 2 % GEL Apply topically 2 (two) times daily.      . Garlic 846 MG TABS Take 1 tablet by mouth daily.      Marland Kitchen guaiFENesin (MUCINEX) 600 MG 12 hr tablet Take 1,200 mg by mouth 2 (two) times daily.      . meloxicam (MOBIC) 15 MG tablet Take 1 tablet (15 mg total) by mouth daily. Take with food, please do not take with other NSAIDs, take for only 1 week.  20 tablet  0  . mesalamine (CANASA) 1000 MG suppository Place 1,000 mg rectally at bedtime.      . Multiple Vitamins-Minerals (MULTIVITAMIN WITH MINERALS) tablet Take 1 tablet by mouth daily.      Marland Kitchen Specialty Vitamins Products (ONE-A-DAY CHOLESTEROL) TABS Take 1 tablet by mouth daily.       No current facility-administered medications on file prior to visit.    Review of Systems:  As per HPI, otherwise negative.    Physical Examination: Filed Vitals:   12/25/13 1637  BP: 128/70  Pulse: 82  Temp: 98.1 F (36.7 C)  Resp: 16   Filed Vitals:   12/25/13 1637  Height: 5' 6"  (1.676 m)  Weight: 174 lb (78.926 kg)   Body mass index is 28.1 kg/(m^2). Ideal Body Weight: Weight in (lb) to have BMI = 25: 154.6  GEN: WDWN, NAD, Non-toxic, A & O x 3 HEENT: Atraumatic, Normocephalic. Neck supple. No masses, No LAD. Ears and Nose: No external deformity. CV: RRR, No M/G/R. No JVD. No thrill. No extra heart sounds. PULM: CTA B, no wheezes, crackles, rhonchi. No retractions. No resp. distress. No accessory muscle use. ABD: S, NT, ND, +BS. No rebound. No HSM. EXTR: No c/c  2+ pitting left leg to mid calf, 1+ on right.  No calf tenderness NEURO Normal gait.  PSYCH: Normally interactive. Conversant. Not depressed or anxious appearing.  Calm demeanor.  No joint swelling or erythema.    Assessment and Plan: Ulcerative colitis Polyarthritis Peripheral edema Needs urgent follow up with his GI Routine to rheumatology Add  HCTZ Follow up in one week  Signed,  Ellison Carwin, MD   Results for orders placed in visit on 12/25/13  POCT CBC      Result Value Ref Range   WBC 5.7  4.6 - 10.2 K/uL   Lymph, poc 1.2  0.6 - 3.4   POC LYMPH PERCENT 21.9  10 - 50 %L   MID (cbc) 0.7  0 - 0.9   POC MID % 11.5  0 - 12 %M   POC Granulocyte 3.8  2 - 6.9   Granulocyte percent 66.6  37 - 80 %G   RBC 3.09 (*) 4.69 - 6.13 M/uL   Hemoglobin 8.5 (*) 14.1 - 18.1 g/dL   HCT, POC 28.2 (*) 43.5 - 53.7 %   MCV 91.3  80 - 97 fL   MCH, POC 27.5  27 - 31.2 pg   MCHC 30.1 (*) 31.8 - 35.4 g/dL   RDW, POC 17.3     Platelet Count, POC 530 (*) 142 - 424 K/uL   MPV 6.0  0 - 99.8 fL

## 2013-12-25 NOTE — Patient Instructions (Signed)

## 2013-12-25 NOTE — Telephone Encounter (Signed)
Patient is coming in, swelling is spreading.  The meds that Dr. Laney Pastor prescribed last year worked well.

## 2013-12-25 NOTE — Telephone Encounter (Signed)
Left a message to call gastro doc tomorrow hemoglobin labs were low

## 2013-12-25 NOTE — Telephone Encounter (Signed)
Pt seen in clinic 12/25/13

## 2013-12-27 ENCOUNTER — Telehealth: Payer: Self-pay

## 2013-12-27 NOTE — Telephone Encounter (Signed)
Pt states he is going to come back in for further testing. He was bit by a tick earlier this year. He is still in pain.

## 2013-12-27 NOTE — Telephone Encounter (Signed)
PATIENT WOULD LIKE TO TALK WITH DR. LE HE STATES ABOUT 1 WEEK AGO DR. LE ASKED HIM IF HE HAD BEEN BITTEN BY A TICK. HE TOLD HER NO, BUT HIS SON REMINDED HIM THAT HE DID FIND A TICK ON HIS STOMACH SOMETIME IN [facility] OR April. HE JUST PULLED IT OFF AND DIDN'T THINK ANYMORE ABOUT IT. HE ALSO WOULD LIKE TO TELL DR. LE THAT THE HYDORCHLOROTHIZIDE 25 MG SHE PRESCRIBED FOR HIM IS CAUSING HIS ANKLES AND KNEES TO SWELL EVEN MORE BECAUSE HE STANDS AT WORK 12 HOURS A DAY. HE CAN HARDLY WALK. HE JUST STARTED TAKING THIS NEW MEDICINE ON Tuesday. HE WOULD LIKE TO KNOW IF DR. LE THINKS HE SHOULD GO OUT ON DISABILITY? BEST PHONE [phone]   [facility]

## 2013-12-28 ENCOUNTER — Ambulatory Visit (INDEPENDENT_AMBULATORY_CARE_PROVIDER_SITE_OTHER): Payer: BC Managed Care – PPO | Admitting: Family Medicine

## 2013-12-28 VITALS — BP 158/52 | HR 86 | Temp 100.2°F | Resp 16 | Ht 66.5 in | Wt 170.6 lb

## 2013-12-28 DIAGNOSIS — M25562 Pain in left knee: Secondary | ICD-10-CM

## 2013-12-28 DIAGNOSIS — M7989 Other specified soft tissue disorders: Secondary | ICD-10-CM

## 2013-12-28 DIAGNOSIS — L03115 Cellulitis of right lower limb: Secondary | ICD-10-CM

## 2013-12-28 DIAGNOSIS — R5081 Fever presenting with conditions classified elsewhere: Secondary | ICD-10-CM

## 2013-12-28 DIAGNOSIS — M130 Polyarthritis, unspecified: Secondary | ICD-10-CM

## 2013-12-28 DIAGNOSIS — R1011 Right upper quadrant pain: Secondary | ICD-10-CM

## 2013-12-28 DIAGNOSIS — D649 Anemia, unspecified: Secondary | ICD-10-CM

## 2013-12-28 DIAGNOSIS — M25561 Pain in right knee: Secondary | ICD-10-CM

## 2013-12-28 LAB — POCT CBC
GRANULOCYTE PERCENT: 65.8 % (ref 37–80)
HEMATOCRIT: 30.8 % — AB (ref 43.5–53.7)
Hemoglobin: 9.4 g/dL — AB (ref 14.1–18.1)
Lymph, poc: 1.9 (ref 0.6–3.4)
MCH, POC: 27.1 pg (ref 27–31.2)
MCHC: 30.4 g/dL — AB (ref 31.8–35.4)
MCV: 89 fL (ref 80–97)
MID (CBC): 0.5 (ref 0–0.9)
MPV: 6.2 fL (ref 0–99.8)
PLATELET COUNT, POC: 612 10*3/uL — AB (ref 142–424)
POC Granulocyte: 4.5 (ref 2–6.9)
POC LYMPH PERCENT: 27.3 %L (ref 10–50)
POC MID %: 6.9 %M (ref 0–12)
RBC: 3.46 M/uL — AB (ref 4.69–6.13)
RDW, POC: 17.9 %
WBC: 6.8 10*3/uL (ref 4.6–10.2)

## 2013-12-28 MED ORDER — CEFTRIAXONE SODIUM 1 G IJ SOLR
1.0000 g | Freq: Once | INTRAMUSCULAR | Status: AC
  Administered 2013-12-28: 1 g via INTRAMUSCULAR

## 2013-12-28 MED ORDER — DOXYCYCLINE HYCLATE 100 MG PO TABS: 100.0000 mg | ORAL_TABLET | Freq: Two times a day (BID) | ORAL | Status: DC

## 2013-12-28 MED ORDER — CEFTRIAXONE SODIUM 1 G IJ SOLR: 1.0000 g | INTRAMUSCULAR | Status: DC

## 2013-12-28 MED ORDER — OMEPRAZOLE 20 MG PO CPDR: 20.0000 mg | DELAYED_RELEASE_CAPSULE | Freq: Every day | ORAL | Status: DC

## 2013-12-28 NOTE — Patient Instructions (Addendum)
Out of work for 2 days, elevate legs, warm compress on reddened area of knee. Will start antibiotic - for possible skin infection. Continue iron twice per day - levels slightly better today.  Stop meloxicam as you are having bood in your stool and now with abdominal pain. Start omeprazole. Recheck tomorrow to recheck reddened area (outlined today), abdominal pain and possibly blood count. Will need to follow up with gastroenterology as planned, but if your abdominal pain or other symptoms are worsening overnight go to emergency room.   Knee Pain The knee is the complex joint between your thigh and your lower leg. It is made up of bones, tendons, ligaments, and cartilage. The bones that make up the knee are:  The femur in the thigh.  The tibia and fibula in the lower leg.  The patella or kneecap riding in the groove on the lower femur. CAUSES  Knee pain is a common complaint with many causes. A few of these causes are:  Injury, such as:  A ruptured ligament or tendon injury.  Torn cartilage.  Medical conditions, such as:  Gout  Arthritis  Infections  Overuse, over training, or overdoing a physical activity. Knee pain can be minor or severe. Knee pain can accompany debilitating injury. Minor knee problems often respond well to self-care measures or get well on their own. More serious injuries may need medical intervention or even surgery. SYMPTOMS The knee is complex. Symptoms of knee problems can vary widely. Some of the problems are:  Pain with movement and weight bearing.  Swelling and tenderness.  Buckling of the knee.  Inability to straighten or extend your knee.  Your knee locks and you cannot straighten it.  Warmth and redness with pain and fever.  Deformity or dislocation of the kneecap. DIAGNOSIS  Determining what is wrong may be very straight forward such as when there is an injury. It can also be challenging because of the complexity of the knee. Tests to make a  diagnosis may include:  Your caregiver taking a history and doing a physical exam.  Routine X-rays can be used to rule out other problems. X-rays will not reveal a cartilage tear. Some injuries of the knee can be diagnosed by:  Arthroscopy a surgical technique by which a small video camera is inserted through tiny incisions on the sides of the knee. This procedure is used to examine and repair internal knee joint problems. Tiny instruments can be used during arthroscopy to repair the torn knee cartilage (meniscus).  Arthrography is a radiology technique. A contrast liquid is directly injected into the knee joint. Internal structures of the knee joint then become visible on X-ray film.  An MRI scan is a non X-ray radiology procedure in which magnetic fields and a computer produce two- or three-dimensional images of the inside of the knee. Cartilage tears are often visible using an MRI scanner. MRI scans have largely replaced arthrography in diagnosing cartilage tears of the knee.  Blood work.  Examination of the fluid that helps to lubricate the knee joint (synovial fluid). This is done by taking a sample out using a needle and a syringe. TREATMENT The treatment of knee problems depends on the cause. Some of these treatments are:  Depending on the injury, proper casting, splinting, surgery, or physical therapy care will be needed.  Give yourself adequate recovery time. Do not overuse your joints. If you begin to get sore during workout routines, back off. Slow down or do fewer repetitions.  For  repetitive activities such as cycling or running, maintain your strength and nutrition.  Alternate muscle groups. For example, if you are a weight lifter, work the upper body on one day and the lower body the next.  Either tight or weak muscles do not give the proper support for your knee. Tight or weak muscles do not absorb the stress placed on the knee joint. Keep the muscles surrounding the knee  strong.  Take care of mechanical problems.  If you have flat feet, orthotics or special shoes may help. See your caregiver if you need help.  Arch supports, sometimes with wedges on the inner or outer aspect of the heel, can help. These can shift pressure away from the side of the knee most bothered by osteoarthritis.  A brace called an "unloader" brace also may be used to help ease the pressure on the most arthritic side of the knee.  If your caregiver has prescribed crutches, braces, wraps or ice, use as directed. The acronym for this is PRICE. This means protection, rest, ice, compression, and elevation.  Nonsteroidal anti-inflammatory drugs (NSAIDs), can help relieve pain. But if taken immediately after an injury, they may actually increase swelling. Take NSAIDs with food in your stomach. Stop them if you develop stomach problems. Do not take these if you have a history of ulcers, stomach pain, or bleeding from the bowel. Do not take without your caregiver's approval if you have problems with fluid retention, heart failure, or kidney problems.  For ongoing knee problems, physical therapy may be helpful.  Glucosamine and chondroitin are over-the-counter dietary supplements. Both may help relieve the pain of osteoarthritis in the knee. These medicines are different from the usual anti-inflammatory drugs. Glucosamine may decrease the rate of cartilage destruction.  Injections of a corticosteroid drug into your knee joint may help reduce the symptoms of an arthritis flare-up. They may provide pain relief that lasts a few months. You may have to wait a few months between injections. The injections do have a small increased risk of infection, water retention, and elevated blood sugar levels.  Hyaluronic acid injected into damaged joints may ease pain and provide lubrication. These injections may work by reducing inflammation. A series of shots may give relief for as long as 6 months.  Topical  painkillers. Applying certain ointments to your skin may help relieve the pain and stiffness of osteoarthritis. Ask your pharmacist for suggestions. Many over the-counter products are approved for temporary relief of arthritis pain.  In some countries, doctors often prescribe topical NSAIDs for relief of chronic conditions such as arthritis and tendinitis. A review of treatment with NSAID creams found that they worked as well as oral medications but without the serious side effects. PREVENTION  Maintain a healthy weight. Extra pounds put more strain on your joints.  Get strong, stay limber. Weak muscles are a common cause of knee injuries. Stretching is important. Include flexibility exercises in your workouts.  Be smart about exercise. If you have osteoarthritis, chronic knee pain or recurring injuries, you may need to change the way you exercise. This does not mean you have to stop being active. If your knees ache after jogging or playing basketball, consider switching to swimming, water aerobics, or other low-impact activities, at least for a few days a week. Sometimes limiting high-impact activities will provide relief.  Make sure your shoes fit well. Choose footwear that is right for your sport.  Protect your knees. Use the proper gear for knee-sensitive activities. Use kneepads  when playing volleyball or laying carpet. Buckle your seat belt every time you drive. Most shattered kneecaps occur in car accidents.  Rest when you are tired. SEEK MEDICAL CARE IF:  You have knee pain that is continual and does not seem to be getting better.  SEEK IMMEDIATE MEDICAL CARE IF:  Your knee joint feels hot to the touch and you have a high fever. MAKE SURE YOU:   Understand these instructions.  Will watch your condition.  Will get help right away if you are not doing well or get worse. Document Released: 01/11/2007 Document Revised: 06/08/2011 Document Reviewed: 01/11/2007 [facility] Patient  Information 2015 [place], [place]. This information is not intended to replace advice given to you by your health care provider. Make sure you discuss any questions you have with your health care provider.   Cellulitis Cellulitis is an infection of the skin and the tissue beneath it. The infected area is usually red and tender. Cellulitis occurs most often in the arms and lower legs.  CAUSES  Cellulitis is caused by bacteria that enter the skin through cracks or cuts in the skin. The most common types of bacteria that cause cellulitis are staphylococci and streptococci. SIGNS AND SYMPTOMS   Redness and warmth.  Swelling.  Tenderness or pain.  Fever. DIAGNOSIS  Your health care provider can usually determine what is wrong based on a physical exam. Blood tests may also be done. TREATMENT  Treatment usually involves taking an antibiotic medicine. HOME CARE INSTRUCTIONS   Take your antibiotic medicine as directed by your health care provider. Finish the antibiotic even if you start to feel better.  Keep the infected arm or leg elevated to reduce swelling.  Apply a warm cloth to the affected area up to 4 times per day to relieve pain.  Take medicines only as directed by your health care provider.  Keep all follow-up visits as directed by your health care provider. SEEK MEDICAL CARE IF:   You notice red streaks coming from the infected area.  Your red area gets larger or turns dark in color.  Your bone or joint underneath the infected area becomes painful after the skin has healed.  Your infection returns in the same area or another area.  You notice a swollen bump in the infected area.  You develop new symptoms.  You have a fever. SEEK IMMEDIATE MEDICAL CARE IF:   You feel very sleepy.  You develop vomiting or diarrhea.  You have a general ill feeling (malaise) with muscle aches and pains. MAKE SURE YOU:   Understand these instructions.  Will watch your  condition.  Will get help right away if you are not doing well or get worse. Document Released: 12/24/2004 Document Revised: 07/31/2013 Document Reviewed: 06/01/2011 [facility] Patient Information 2015 [place], [place]. This information is not intended to replace advice given to you by your health care provider. Make sure you discuss any questions you have with your health care provider.

## 2013-12-28 NOTE — Progress Notes (Addendum)
Subjective:    Patient ID: Phillip Glass, male    DOB: [birth year], 58 y.o.   MRN: [id number] This chart was scribed for Phillip Agreste, MD by Martinique Peace, ED Scribe. The patient was seen in [facility]. The patient's care was started at 5:37 PM.  HPI HPI Comments: Pt is here for F/U for swelling in her legs. Last seen by Dr. Myrtie Hawk 3 days ago, Dr. Marin Comment 1 week prior, similar symptoms. Initially when seen by Dr. Marin Comment noted to be anemic, with hemoglobin 9.9. History of colitis, did note some blood in stool but not typical of colitis flare up. Was also treated with Mobic for arthtiritis pain in the wrist. Per Dr. Marin Comment exam, minimal swelling in ankles, lungs were clear, noted to be iron deficient. Recommended to start iron 325 mg BID. Was seen by Dr. Myrtie Hawk on Sept 28th and noted worsening of joint pain and swelling. Hemoglobin had lowered to 8.5 at that visit from previous reading of 9.9 week prior. Advised to follow up urgently with GI and referred to Rhemutology. Started on HCTZ 25 mg QD. In review of blood counts, hemoglobin was 12.7 1 year ago, 9.9 last week, 8.5 three days ago. CMP normal on Sept 28th, RF also normal. Sed rate was elevated at 72.   Phillip Glass is a 58 y.o. male who presents to the [facility] following up regarding worsening bilateral knee pain. Pt states that left knee gave out on him during a shift and right knee is constantly throbbing. Pt reports knees and legs have started swelling over the past couple days. He also notes swelling and redness to medial aspects of both knees. Dr. Alonza Bogus, GI doctor. He denies any similar occurrences in the past that have caused his knees to flare up like current episode. He does admit to feeling lightheaded over past couple days since starting medication for low iron but denies any SOB or CP. Pt states that he typically works 12 hours shifts and his job does not allow for him to eat often. Pt states that Dr. Laney Pastor is his PCP. He denies noticing any  dark or black stools but has noticed bright red blood on tissue upon wiping onset 4 or 5 days ago. He denies any increase in amount of blood noted but states he does usually see some spotting. Pt denies any history of Gout or history of PUD. Pt is taking Meloxicam once per day and iron deficiency medication twice a day.    Patient Active Problem List   Diagnosis Date Noted  . Colitis 04/08/2012  . Routine general medical examination at a health care facility 03/03/2012  . Multiple nevi 03/03/2012   Past Medical History  Diagnosis Date  . Colitis 03/30/1978  . Colitis    Past Surgical History  Procedure Laterality Date  . Colonoscopy  01/12/2011  . Tonsillectomy     Allergies  Allergen Reactions  . Codeine Other (See Comments)    Pt states he hallucinates   Prior to Admission medications   Medication Sig Start Date End Date Taking? Authorizing Provider  Aloe Vera 500 MG CAPS Take 1 capsule by mouth daily.   Yes Historical Provider, MD  balsalazide (COLAZAL) 750 MG capsule Take 4,500 mg by mouth 2 (two) times daily.   Yes Historical Provider, MD  Bioflavonoid Products (BIOFLEX PO) Take 1 tablet by mouth daily.   Yes Historical Provider, MD  diltiazem 2 % GEL Apply topically 2 (two) times daily.   Yes  Historical Provider, MD  Ferrous Gluconate (IRON) 240 (27 FE) MG TABS Take 1 tablet by mouth daily.   Yes Historical Provider, MD  Garlic 893 MG TABS Take 1 tablet by mouth daily.   Yes Historical Provider, MD  hydrochlorothiazide (HYDRODIURIL) 25 MG tablet Take 1 tablet (25 mg total) by mouth daily. 12/25/13  Yes Roselee Culver, MD  mesalamine (CANASA) 1000 MG suppository Place 1,000 mg rectally at bedtime.   Yes Historical Provider, MD  Multiple Vitamins-Minerals (MULTIVITAMIN WITH MINERALS) tablet Take 1 tablet by mouth daily.   Yes Historical Provider, MD  Specialty Vitamins Products (ONE-A-DAY CHOLESTEROL) TABS Take 1 tablet by mouth daily.   Yes Historical Provider, MD  traMADol  (ULTRAM) 50 MG tablet Take 1 tablet (50 mg total) by mouth every 8 (eight) hours as needed. 12/25/13  Yes Roselee Culver, MD   History   Social History  . Marital Status: Legally Separated    Spouse Name: N/A    Number of Children: N/A  . Years of Education: N/A   Occupational History  . Not on file.   Social History Main Topics  . Smoking status: Never Smoker   . Smokeless tobacco: Never Used  . Alcohol Use: No  . Drug Use: No  . Sexual Activity: No   Other Topics Concern  . Not on file   Social History Narrative   Marital status: divorced in 2013.  Not dating.  Married x 20 years.      Children:  1 child (22); no grandchild.      Lives: with son.      Employment: Scientist, clinical (histocompatibility and immunogenetics) x 35 years; happy.      Tobacco; none      Alcohol: none      Drugs; none      Exercise:  6 days per week for 45 minutes.  Cardio twice weekly; weightlifting 5 days per week.      Seatbelt: 100%      Guns:  Locked and loaded.     Review of Systems  Constitutional: Negative for fatigue and unexpected weight change.  Eyes: Negative for visual disturbance.  Respiratory: Negative for cough, chest tightness and shortness of breath.   Cardiovascular: Negative for chest pain, palpitations and leg swelling.  Gastrointestinal: Positive for nausea and abdominal pain (RUQ). Negative for vomiting and blood in stool.  Neurological: Negative for dizziness, light-headedness and headaches.       Objective:   Physical Exam  Vitals reviewed. Constitutional: He is oriented to person, place, and time. He appears well-developed and well-nourished.  HENT:  Head: Normocephalic and atraumatic.  Eyes: EOM are normal. Pupils are equal, round, and reactive to light.  Neck: No JVD present. Carotid bruit is not present.  Cardiovascular: Normal rate, regular rhythm and normal heart sounds.   No murmur heard. Pulmonary/Chest: Effort normal and breath sounds normal. He has no rales.  Abdominal: Bowel sounds  are normal. He exhibits no distension. There is tenderness. There is no tenderness at McBurney's point and negative Murphy's sign.  Normal bowel sounds, flat nondistended. Tender RUQ. Negative Heel Jar.   Musculoskeletal: He exhibits no edema.  Left knee- skin intact, minimal erythema superiorly to patella. 1+ edema of LE on midtibia. No joint line tenderness, Full ROM. No appreciable effusion.  Diffuse swelling of left ankle. Warmth about left ankle but skin intact and no erythema.  Swelling of right ankle, with tenderness but no redness. 1-2+pitting edema RLE.  Calves are non-tender.  Right  knee- 15 x 10 cm area of erythema and warmth on dorsal medial surface of knee overlying scar that extends to medial knee and distal aspect of thigh. Tender diffusely across anterior superior knee. Lateral greater than medial. Possible small effusion, but able to flex and extend without difficulty.   Neurological: He is alert and oriented to person, place, and time.  Skin: Skin is warm and dry.  Psychiatric: He has a normal mood and affect.      Medications - No data to display Filed Vitals:   12/28/13 1706  BP: 158/52  Pulse: 86  Temp: 100.2 F (37.9 C)  TempSrc: Oral  Resp: 16  Height: 5' 6.5" (1.689 m)  Weight: 170 lb 9.6 oz (77.384 kg)  SpO2: 98%   Results for orders placed in visit on 12/28/13  POCT CBC      Result Value Ref Range   WBC 6.8  4.6 - 10.2 K/uL   Lymph, poc 1.9  0.6 - 3.4   POC LYMPH PERCENT 27.3  10 - 50 %L   MID (cbc) 0.5  0 - 0.9   POC MID % 6.9  0 - 12 %M   POC Granulocyte 4.5  2 - 6.9   Granulocyte percent 65.8  37 - 80 %G   RBC 3.46 (*) 4.69 - 6.13 M/uL   Hemoglobin 9.4 (*) 14.1 - 18.1 g/dL   HCT, POC 30.8 (*) 43.5 - 53.7 %   MCV 89.0  80 - 97 fL   MCH, POC 27.1  27 - 31.2 pg   MCHC 30.4 (*) 31.8 - 35.4 g/dL   RDW, POC 17.9     Platelet Count, POC 612 (*) 142 - 424 K/uL   MPV 6.2  0 - 99.8 fL       Assessment & Plan:    HERSHEY KNAUER is a 58 y.o.  male Anemia, unspecified anemia type - Plan: POCT CBC  - improving - continue iron, follow up with GI.   Leg swelling  - anemia component or dependent edema form knee pain/swelling. Cont HCTZ, elevate legs, OOW for next few days, recheck in 1 day.    Polyarthritis, arthralgia of both knees  -inflammatory polyarthritis possible with underlying colitis. Rheum appt pending. Stopped mobic with abdominal pain, but can increase ultram to Q6hprn.   Cellulitis of right knee Fever presenting with conditions classified elsewhere - Plan: doxycycline (VIBRA-TABS) 100 MG tablet, cefTRIAXone (ROCEPHIN) injection 1 g, DISCONTINUED: cefTRIAXone (ROCEPHIN) injection 1 g - Plan: POCT CBC, doxycycline (VIBRA-TABS) 100 MG tablet, cefTRIAXone (ROCEPHIN) injection 1 g, DISCONTINUED: cefTRIAXone (ROCEPHIN) injection 1 g  -suspected cellulitis, over prior wound. Doubt septic knee as able to achieve ROM without difficulty.   -rocephin 1 gram, start doxycycline 161m BID, outlined and recheck in 1 day.   RUQ abdominal pain - Plan: omeprazole (PRILOSEC) 20 MG capsule  -no known hx of PUD, but with blood in stool (colitis likely cause), will stop Mobic, start omeprazole, and recheck in 1 day.  Overnight  ER precautions discussed.   Meds ordered this encounter  Medications  . Ferrous Gluconate (IRON) 240 (27 FE) MG TABS    Sig: Take 1 tablet by mouth daily.  .Marland Kitchendoxycycline (VIBRA-TABS) 100 MG tablet    Sig: Take 1 tablet (100 mg total) by mouth 2 (two) times daily.    Dispense:  20 tablet    Refill:  0  . omeprazole (PRILOSEC) 20 MG capsule    Sig: Take 1 capsule (20  mg total) by mouth daily.    Dispense:  30 capsule    Refill:  1  . DISCONTD: cefTRIAXone (ROCEPHIN) injection 1 g    Sig:     Order Specific Question:  Antibiotic Indication:    Answer:  Cellulitis  . cefTRIAXone (ROCEPHIN) injection 1 g    Sig:     Order Specific Question:  Antibiotic Indication:    Answer:  Cellulitis   Patient Instructions   Out of work for 2 days, elevate legs, warm compress on reddened area of knee. Will start antibiotic - for possible skin infection. Continue iron twice per day - levels slightly better today.  Stop meloxicam as you are having bood in your stool and now with abdominal pain. Start omeprazole. Recheck tomorrow to recheck reddened area (outlined today), abdominal pain and possibly blood count. Will need to follow up with gastroenterology as planned, but if your abdominal pain or other symptoms are worsening overnight go to emergency room.   Knee Pain The knee is the complex joint between your thigh and your lower leg. It is made up of bones, tendons, ligaments, and cartilage. The bones that make up the knee are:  The femur in the thigh.  The tibia and fibula in the lower leg.  The patella or kneecap riding in the groove on the lower femur. CAUSES  Knee pain is a common complaint with many causes. A few of these causes are:  Injury, such as:  A ruptured ligament or tendon injury.  Torn cartilage.  Medical conditions, such as:  Gout  Arthritis  Infections  Overuse, over training, or overdoing a physical activity. Knee pain can be minor or severe. Knee pain can accompany debilitating injury. Minor knee problems often respond well to self-care measures or get well on their own. More serious injuries may need medical intervention or even surgery. SYMPTOMS The knee is complex. Symptoms of knee problems can vary widely. Some of the problems are:  Pain with movement and weight bearing.  Swelling and tenderness.  Buckling of the knee.  Inability to straighten or extend your knee.  Your knee locks and you cannot straighten it.  Warmth and redness with pain and fever.  Deformity or dislocation of the kneecap. DIAGNOSIS  Determining what is wrong may be very straight forward such as when there is an injury. It can also be challenging because of the complexity of the knee. Tests to make  a diagnosis may include:  Your caregiver taking a history and doing a physical exam.  Routine X-rays can be used to rule out other problems. X-rays will not reveal a cartilage tear. Some injuries of the knee can be diagnosed by:  Arthroscopy a surgical technique by which a small video camera is inserted through tiny incisions on the sides of the knee. This procedure is used to examine and repair internal knee joint problems. Tiny instruments can be used during arthroscopy to repair the torn knee cartilage (meniscus).  Arthrography is a radiology technique. A contrast liquid is directly injected into the knee joint. Internal structures of the knee joint then become visible on X-ray film.  An MRI scan is a non X-ray radiology procedure in which magnetic fields and a computer produce two- or three-dimensional images of the inside of the knee. Cartilage tears are often visible using an MRI scanner. MRI scans have largely replaced arthrography in diagnosing cartilage tears of the knee.  Blood work.  Examination of the fluid that helps to lubricate the  knee joint (synovial fluid). This is done by taking a sample out using a needle and a syringe. TREATMENT The treatment of knee problems depends on the cause. Some of these treatments are:  Depending on the injury, proper casting, splinting, surgery, or physical therapy care will be needed.  Give yourself adequate recovery time. Do not overuse your joints. If you begin to get sore during workout routines, back off. Slow down or do fewer repetitions.  For repetitive activities such as cycling or running, maintain your strength and nutrition.  Alternate muscle groups. For example, if you are a weight lifter, work the upper body on one day and the lower body the next.  Either tight or weak muscles do not give the proper support for your knee. Tight or weak muscles do not absorb the stress placed on the knee joint. Keep the muscles surrounding the knee  strong.  Take care of mechanical problems.  If you have flat feet, orthotics or special shoes may help. See your caregiver if you need help.  Arch supports, sometimes with wedges on the inner or outer aspect of the heel, can help. These can shift pressure away from the side of the knee most bothered by osteoarthritis.  A brace called an "unloader" brace also may be used to help ease the pressure on the most arthritic side of the knee.  If your caregiver has prescribed crutches, braces, wraps or ice, use as directed. The acronym for this is PRICE. This means protection, rest, ice, compression, and elevation.  Nonsteroidal anti-inflammatory drugs (NSAIDs), can help relieve pain. But if taken immediately after an injury, they may actually increase swelling. Take NSAIDs with food in your stomach. Stop them if you develop stomach problems. Do not take these if you have a history of ulcers, stomach pain, or bleeding from the bowel. Do not take without your caregiver's approval if you have problems with fluid retention, heart failure, or kidney problems.  For ongoing knee problems, physical therapy may be helpful.  Glucosamine and chondroitin are over-the-counter dietary supplements. Both may help relieve the pain of osteoarthritis in the knee. These medicines are different from the usual anti-inflammatory drugs. Glucosamine may decrease the rate of cartilage destruction.  Injections of a corticosteroid drug into your knee joint may help reduce the symptoms of an arthritis flare-up. They may provide pain relief that lasts a few months. You may have to wait a few months between injections. The injections do have a small increased risk of infection, water retention, and elevated blood sugar levels.  Hyaluronic acid injected into damaged joints may ease pain and provide lubrication. These injections may work by reducing inflammation. A series of shots may give relief for as long as 6 months.  Topical  painkillers. Applying certain ointments to your skin may help relieve the pain and stiffness of osteoarthritis. Ask your pharmacist for suggestions. Many over the-counter products are approved for temporary relief of arthritis pain.  In some countries, doctors often prescribe topical NSAIDs for relief of chronic conditions such as arthritis and tendinitis. A review of treatment with NSAID creams found that they worked as well as oral medications but without the serious side effects. PREVENTION  Maintain a healthy weight. Extra pounds put more strain on your joints.  Get strong, stay limber. Weak muscles are a common cause of knee injuries. Stretching is important. Include flexibility exercises in your workouts.  Be smart about exercise. If you have osteoarthritis, chronic knee pain or recurring injuries, you may  need to change the way you exercise. This does not mean you have to stop being active. If your knees ache after jogging or playing basketball, consider switching to swimming, water aerobics, or other low-impact activities, at least for a few days a week. Sometimes limiting high-impact activities will provide relief.  Make sure your shoes fit well. Choose footwear that is right for your sport.  Protect your knees. Use the proper gear for knee-sensitive activities. Use kneepads when playing volleyball or laying carpet. Buckle your seat belt every time you drive. Most shattered kneecaps occur in car accidents.  Rest when you are tired. SEEK MEDICAL CARE IF:  You have knee pain that is continual and does not seem to be getting better.  SEEK IMMEDIATE MEDICAL CARE IF:  Your knee joint feels hot to the touch and you have a high fever. MAKE SURE YOU:   Understand these instructions.  Will watch your condition.  Will get help right away if you are not doing well or get worse. Document Released: 01/11/2007 Document Revised: 06/08/2011 Document Reviewed: 01/11/2007 [facility] Patient  Information 2015 [place], [place]. This information is not intended to replace advice given to you by your health care provider. Make sure you discuss any questions you have with your health care provider.   Cellulitis Cellulitis is an infection of the skin and the tissue beneath it. The infected area is usually red and tender. Cellulitis occurs most often in the arms and lower legs.  CAUSES  Cellulitis is caused by bacteria that enter the skin through cracks or cuts in the skin. The most common types of bacteria that cause cellulitis are staphylococci and streptococci. SIGNS AND SYMPTOMS   Redness and warmth.  Swelling.  Tenderness or pain.  Fever. DIAGNOSIS  Your health care provider can usually determine what is wrong based on a physical exam. Blood tests may also be done. TREATMENT  Treatment usually involves taking an antibiotic medicine. HOME CARE INSTRUCTIONS   Take your antibiotic medicine as directed by your health care provider. Finish the antibiotic even if you start to feel better.  Keep the infected arm or leg elevated to reduce swelling.  Apply a warm cloth to the affected area up to 4 times per day to relieve pain.  Take medicines only as directed by your health care provider.  Keep all follow-up visits as directed by your health care provider. SEEK MEDICAL CARE IF:   You notice red streaks coming from the infected area.  Your red area gets larger or turns dark in color.  Your bone or joint underneath the infected area becomes painful after the skin has healed.  Your infection returns in the same area or another area.  You notice a swollen bump in the infected area.  You develop new symptoms.  You have a fever. SEEK IMMEDIATE MEDICAL CARE IF:   You feel very sleepy.  You develop vomiting or diarrhea.  You have a general ill feeling (malaise) with muscle aches and pains. MAKE SURE YOU:   Understand these instructions.  Will watch your  condition.  Will get help right away if you are not doing well or get worse. Document Released: 12/24/2004 Document Revised: 07/31/2013 Document Reviewed: 06/01/2011 [facility] Patient Information 2015 [place], [place]. This information is not intended to replace advice given to you by your health care provider. Make sure you discuss any questions you have with your health care provider.        I personally performed the services described  in this documentation, which was scribed in my presence. The recorded information has been reviewed and considered, and addended by me as needed.

## 2013-12-29 ENCOUNTER — Ambulatory Visit (INDEPENDENT_AMBULATORY_CARE_PROVIDER_SITE_OTHER): Payer: BC Managed Care – PPO | Admitting: Family Medicine

## 2013-12-29 VITALS — BP 132/76 | HR 97 | Temp 98.1°F | Resp 16 | Ht 66.5 in | Wt 170.0 lb

## 2013-12-29 DIAGNOSIS — R1011 Right upper quadrant pain: Secondary | ICD-10-CM

## 2013-12-29 DIAGNOSIS — M2550 Pain in unspecified joint: Secondary | ICD-10-CM

## 2013-12-29 DIAGNOSIS — L52 Erythema nodosum: Secondary | ICD-10-CM

## 2013-12-29 DIAGNOSIS — K51918 Ulcerative colitis, unspecified with other complication: Secondary | ICD-10-CM

## 2013-12-29 NOTE — Patient Instructions (Signed)
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  This is likely due to the underlying colitis. Will hold off on new medication at this time, but may need to start other medicine to treat this condition once you have seen dermatology.  Keep follow up as planned with rheumatology and your gastroenterologist.  Madaline Brilliant to continue doxycycline for now, ultram as needed for pain If you follow up next Wednesday - I am here at 2pm.  If improving then. May not need to start new medicine if improving then.  Return to the clinic or go to the nearest emergency room if any of your symptoms worsen or new symptoms occur.   Erythema Nodosum Erythema nodosum is also called "painful red nodules on the legs". Symptoms can include sudden onset of fever, fatigue and joint pains. Red, deep, tender, raised, bruise-like bumps form on the shin, or sometimes on the arms or trunk. The skin over the bumps is usually shiny. The symptoms usually last 1-2 weeks. The symptoms usually improve once the cause is treated. This illness may be caused by strep or fungal infection, drug reaction, pregnancy, or other medical conditions. Treating the cause is important to early recovery. Erythema nodosum occurs at any age and in both sexes but more often in young adult women. Erythema nodosum is not a skin infection. It is a reaction to something internal. In most cases the illness and bumps go away with treatment. You should avoid any medicine that may have caused this reaction. Antihistamine drugs, anti-inflammatory drugs or cortisone medicine may be prescribed to relieve symptoms. SSKI drops ([place]) taken with juice at breakfast, lunch and dinner may have an anti-inflammatory effect and speed the healing. Bed rest and limiting vigorous exercise help to shorten the course of erythema nodosum. Elevation of the affected limb also helps in recovery. As the condition gets better, the bumps flatten and  your skin may heal with temporary bruise marks. These dark marks will clear up in several months and they are a good sign that your skin is healing.  SEEK IMMEDIATE MEDICAL CARE IF:   Your condition worsens, or if you have more severe symptoms such a high fever, sore throat, or repeated vomiting. Document Released: 04/23/2004 Document Revised: 06/08/2011 Document Reviewed: 04/27/2008 [facility] Patient Information 2015 [place], [place]. This information is not intended to replace advice given to you by your health care provider. Make sure you discuss any questions you have with your health care provider.

## 2013-12-29 NOTE — Progress Notes (Signed)
Subjective:    Patient ID: Phillip Glass, male    DOB: Feb 08, 1956, 58 y.o.   MRN: [id number]  HPI Phillip Glass is a 58 y.o. male  See ov yesterday. Here for follow up leg swelling, anemia, cellulitis of legs and abdominal pain. Started on Rocephin 1gram yesterday, doxycycline x 2 doses.   Knee pain, swelling, cellulitis.  less puffy, still some redness, but no fever, no elevated temp.  No subjective fever/chills. Ankle swelling about the same - has been trying to keep elevated. NO chest pain, no dyspnea. No other rash or redness. Pt has an appt with Dr. Amil Amen ? on 01/31/14 at 830.  Had some L knee popping at work yesterday, some small areas of redness on top of it as well.   Abdominal pain - still some soreness, but less than yesterday, no N/V.,no diarrhea, able to eat ok last night and this am. Minimal blood in stool this am, same as in past. Appt with GI October 12th. Takes Mesalamine QHS, and basalazide 4522m QD. No recent change in these doses.  Has stopped Mobic, is taking omeprazole since last night.     Review of Systems  Constitutional: Negative for fever, chills and appetite change.  Gastrointestinal: Positive for abdominal pain (much less than yesterday. ) and blood in stool. Negative for nausea, vomiting, diarrhea and abdominal distention.  Musculoskeletal: Positive for arthralgias and joint swelling.       Objective:   Physical Exam  Vitals reviewed. Constitutional: He is oriented to person, place, and time. He appears well-developed and well-nourished.  HENT:  Head: Normocephalic and atraumatic.  Eyes: EOM are normal. Pupils are equal, round, and reactive to light.  Neck: No JVD present. Carotid bruit is not present.  Cardiovascular: Normal rate, regular rhythm and normal heart sounds.   No murmur heard. Pulmonary/Chest: Effort normal and breath sounds normal. He has no rales.  Abdominal: There is tenderness (minimal RUQ, with deep palpation. ) in the right  upper quadrant. There is no rigidity, no rebound, no guarding, no tenderness at McBurney's point and negative Murphy's sign.  Musculoskeletal: He exhibits no edema.       Right knee: He exhibits swelling (on anterior aspectof R knee. ), erythema (anterior - less prevalent than yesterday - receeded from prior outline - repeat outlined. ) and bony tenderness. He exhibits normal range of motion and no effusion. Tenderness found. Medial joint line tenderness noted.       Left knee: He exhibits swelling and erythema. He exhibits normal range of motion, no effusion, no ecchymosis, no deformity and no bony tenderness. Tenderness found.       Left hand: He exhibits tenderness and swelling.       Hands:      Legs: Neurological: He is alert and oriented to person, place, and time.  Skin: Skin is warm and dry.  Psychiatric: He has a normal mood and affect.       Assessment & Plan:  Phillip HYMONis a 58y.o. male Polyarthralgia   Erythema nodosum - Plan: Ambulatory referral to Dermatology  Ulcerative colitis, other complication - Plan: Ambulatory referral to Dermatology  RUQ abdominal pain   -suspected inflammatory polyarthritis, with underlying UC. Continue ultram if needed, holding on prednisone or NSAID for now with prior abdominal pain which is now improving off nsaid and on PPI. Now appears that redness on knees is E. Nodosum form UC. Will refer to derm for eval/biopsy, then if positive for  E. Nodosum., could consider new med at that time if not improving on their own.  Recheck with me in approx 5 days, Rheum and GI appts pending. rtc precautions if worsening. Ok to continue doxy for now for possible secondary cellulitis of R knee that is stable.   No orders of the defined types were placed in this encounter.   Patient Instructions  We will refer you to dermatologist for possible biopsy - the red areas appear to be erythema nodosum.  This is likely due to the underlying colitis. Will hold off  on new medication at this time, but may need to start other medicine to treat this condition once you have seen dermatology.  Keep follow up as planned with rheumatology and your gastroenterologist.  Madaline Brilliant to continue doxycycline for now, ultram as needed for pain If you follow up next Wednesday - I am here at 2pm.  If improving then. May not need to start new medicine if improving then.  Return to the clinic or go to the nearest emergency room if any of your symptoms worsen or new symptoms occur.   Erythema Nodosum Erythema nodosum is also called "painful red nodules on the legs". Symptoms can include sudden onset of fever, fatigue and joint pains. Red, deep, tender, raised, bruise-like bumps form on the shin, or sometimes on the arms or trunk. The skin over the bumps is usually shiny. The symptoms usually last 1-2 weeks. The symptoms usually improve once the cause is treated. This illness may be caused by strep or fungal infection, drug reaction, pregnancy, or other medical conditions. Treating the cause is important to early recovery. Erythema nodosum occurs at any age and in both sexes but more often in young adult women. Erythema nodosum is not a skin infection. It is a reaction to something internal. In most cases the illness and bumps go away with treatment. You should avoid any medicine that may have caused this reaction. Antihistamine drugs, anti-inflammatory drugs or cortisone medicine may be prescribed to relieve symptoms. SSKI drops ([place]) taken with juice at breakfast, lunch and dinner may have an anti-inflammatory effect and speed the healing. Bed rest and limiting vigorous exercise help to shorten the course of erythema nodosum. Elevation of the affected limb also helps in recovery. As the condition gets better, the bumps flatten and your skin may heal with temporary bruise marks. These dark marks will clear up in several months and they are a good sign that your skin is healing.  SEEK  IMMEDIATE MEDICAL CARE IF:   Your condition worsens, or if you have more severe symptoms such a high fever, sore throat, or repeated vomiting. Document Released: 04/23/2004 Document Revised: 06/08/2011 Document Reviewed: 04/27/2008 [facility] Patient Information 2015 [place], [place]. This information is not intended to replace advice given to you by your health care provider. Make sure you discuss any questions you have with your health care provider.

## 2014-01-03 ENCOUNTER — Ambulatory Visit (INDEPENDENT_AMBULATORY_CARE_PROVIDER_SITE_OTHER): Payer: BC Managed Care – PPO | Admitting: Family Medicine

## 2014-01-03 ENCOUNTER — Ambulatory Visit (HOSPITAL_COMMUNITY)
Admission: RE | Admit: 2014-01-03 | Discharge: 2014-01-03 | Disposition: A | Discharge status: Home / Self Care | Payer: BC Managed Care – PPO | Source: Ambulatory Visit | Attending: Family Medicine | Admitting: Family Medicine

## 2014-01-03 VITALS — BP 126/74 | HR 109 | Temp 100.1°F | Resp 17 | Ht 67.0 in | Wt 169.0 lb

## 2014-01-03 DIAGNOSIS — R509 Fever, unspecified: Secondary | ICD-10-CM | POA: Diagnosis not present

## 2014-01-03 DIAGNOSIS — R1011 Right upper quadrant pain: Secondary | ICD-10-CM | POA: Diagnosis present

## 2014-01-03 DIAGNOSIS — K5190 Ulcerative colitis, unspecified, without complications: Secondary | ICD-10-CM | POA: Diagnosis not present

## 2014-01-03 DIAGNOSIS — K51919 Ulcerative colitis, unspecified with unspecified complications: Secondary | ICD-10-CM

## 2014-01-03 DIAGNOSIS — M25579 Pain in unspecified ankle and joints of unspecified foot: Secondary | ICD-10-CM

## 2014-01-03 DIAGNOSIS — M25569 Pain in unspecified knee: Secondary | ICD-10-CM

## 2014-01-03 DIAGNOSIS — R5081 Fever presenting with conditions classified elsewhere: Secondary | ICD-10-CM

## 2014-01-03 DIAGNOSIS — R6883 Chills (without fever): Secondary | ICD-10-CM

## 2014-01-03 DIAGNOSIS — L52 Erythema nodosum: Secondary | ICD-10-CM

## 2014-01-03 LAB — POCT CBC
Granulocyte percent: 80 %G (ref 37–80)
HCT, POC: 34 % — AB (ref 43.5–53.7)
HEMOGLOBIN: 10.2 g/dL — AB (ref 14.1–18.1)
LYMPH, POC: 1.6 (ref 0.6–3.4)
MCH: 26.6 pg — AB (ref 27–31.2)
MCHC: 30.1 g/dL — AB (ref 31.8–35.4)
MCV: 88.5 fL (ref 80–97)
MID (cbc): 0.5 (ref 0–0.9)
MPV: 6.2 fL (ref 0–99.8)
POC Granulocyte: 8.6 — AB (ref 2–6.9)
POC LYMPH %: 15 % (ref 10–50)
POC MID %: 5 % (ref 0–12)
Platelet Count, POC: 717 10*3/uL — AB (ref 142–424)
RBC: 3.84 M/uL — AB (ref 4.69–6.13)
RDW, POC: 18.2 %
WBC: 10.7 10*3/uL — AB (ref 4.6–10.2)

## 2014-01-03 MED ORDER — HYDROCODONE-ACETAMINOPHEN 5-325 MG PO TABS: 1.0000 | ORAL_TABLET | Freq: Four times a day (QID) | ORAL | Status: DC | PRN

## 2014-01-03 MED ORDER — IOHEXOL 300 MG/ML  SOLN
100.0000 mL | Freq: Once | INTRAMUSCULAR | Status: AC | PRN
  Administered 2014-01-03: 100 mL via INTRAVENOUS

## 2014-01-03 NOTE — Patient Instructions (Addendum)
CT scan tonight, change Ultram to hydrocodone - 1 to 2 every 6 hours if needed for pain. Further workup to be determined after cat scan.   Return to the clinic or go to the nearest emergency room if any of your symptoms worsen or new symptoms occur.   I will have you out of work until Monday at this point, but may need to adjust time out based on your symptoms.

## 2014-01-03 NOTE — Progress Notes (Addendum)
Subjective:   Patient ID: Phillip Glass, male    DOB: Aug 19, 1955, 58 y.o.   MRN: [id number] This chart was scribed for Ranell Patrick. Carlota Raspberry, MD by Cathie Hoops, ED Scribe. The patient was seen in Room 2. The patient's care was started at 4:39 PM.   01/03/2014  Follow-up   HPI HPI Comments: Phillip Glass is a 57 y.o. male who presents to the [facility] here for recheck.  Here for follow-up of arthralgias and rash on the extremities. Hx of colitis with flare arthralgias thought to be inflammatory arthritis from his underlying colitis. Rash on legs thought to be erythema nodosum, plan for evaluation with dermatology with biopsy and have rheumatology and GI appointments pending. He was initially treated with Doxycycline on October 1 for possible cellulitis overlying his right knee but E. Nodosum suspected.  Rash Pt notes redness on the knees bilaterally has worsened since his last visit. He states the red area on his right knee has gotten larger, and notes there is a new area of redness on the inside of the right knee. The area on his left knee has also gotten larger.   Arthralgias He notes his bilateral knee pain has increased, and is the worst in the mornings. Pt states he wobbles as he ambulates due to pain when he has to stand from a sitting position. He describes his pain as 1000 needles poking him in the knees. Pain in knees with movement and pain in ankles with movement has worsened. He also has pain in his hands and wrists bilaterally that has worsened. Pt also notes pain at his ankles bilaterally that is worsened with ROM. Pt takes one 50 mg Tramadol at a time. Pt denies attempting to take two tramadol to relieve his pain.  Abdominal Pain Pt is still complaining of RUQ abdominal pain that is worsening. His abdominal pain sarted after NSAID and improved with PPI but now has returned onset several days ago.He describes his pain as sharp.  Pt has associated chills  without rigor, pt states he ran a 99.5 fever yesterday. Pt notes he is still taking Doxycycline and denies missing any doses.   Pt notes he cannot see Rheumatology and Dermatology until November 4 and he has not heard a response from GI.  Pt denies watery stools, dark stools, blood in stool, diarrhea, nausea and vomiting.    Patient Active Problem List   Diagnosis Date Noted  . Colitis 04/08/2012  . Routine general medical examination at a health care facility 03/03/2012  . Multiple nevi 03/03/2012   Past Medical History  Diagnosis Date  . Colitis 03/30/1978  . Colitis    Past Surgical History  Procedure Laterality Date  . Colonoscopy  01/12/2011  . Tonsillectomy     Allergies  Allergen Reactions  . Codeine Other (See Comments)    Pt states he hallucinates   Prior to Admission medications   Medication Sig Start Date End Date Taking? Authorizing Provider  Aloe Vera 500 MG CAPS Take 1 capsule by mouth daily.   Yes Historical Provider, MD  balsalazide (COLAZAL) 750 MG capsule Take 4,500 mg by mouth 2 (two) times daily.   Yes Historical Provider, MD  Bioflavonoid Products (BIOFLEX PO) Take 1 tablet by mouth daily.   Yes Historical Provider, MD  diltiazem 2 % GEL Apply topically 2 (two) times daily.   Yes Historical Provider, MD  doxycycline (VIBRA-TABS) 100 MG tablet Take 1 tablet (100 mg total) by  mouth 2 (two) times daily. 12/28/13  Yes [name], MD  Ferrous Gluconate (IRON) 240 (27 FE) MG TABS Take 1 tablet by mouth daily.   Yes Historical Provider, MD  Garlic 431 MG TABS Take 1 tablet by mouth daily.   Yes Historical Provider, MD  hydrochlorothiazide (HYDRODIURIL) 25 MG tablet Take 1 tablet (25 mg total) by mouth daily. 12/25/13  Yes Roselee Culver, MD  mesalamine (CANASA) 1000 MG suppository Place 1,000 mg rectally at bedtime.   Yes Historical Provider, MD  Multiple Vitamins-Minerals (MULTIVITAMIN WITH MINERALS) tablet Take 1 tablet by mouth daily.   Yes Historical  Provider, MD  omeprazole (PRILOSEC) 20 MG capsule Take 1 capsule (20 mg total) by mouth daily. 12/28/13  Yes [name], MD  Specialty Vitamins Products (ONE-A-DAY CHOLESTEROL) TABS Take 1 tablet by mouth daily.   Yes Historical Provider, MD  traMADol (ULTRAM) 50 MG tablet Take 1 tablet (50 mg total) by mouth every 8 (eight) hours as needed. 12/25/13  Yes Roselee Culver, MD   History   Social History  . Marital Status: Legally Separated    Spouse Name: N/A    Number of Children: N/A  . Years of Education: N/A   Occupational History  . Not on file.   Social History Main Topics  . Smoking status: Never Smoker   . Smokeless tobacco: Never Used  . Alcohol Use: No  . Drug Use: No  . Sexual Activity: No   Other Topics Concern  . Not on file   Social History Narrative   Marital status: divorced in 2013.  Not dating.  Married x 20 years.      Children:  1 child (22); no grandchild.      Lives: with son.      Employment: Scientist, clinical (histocompatibility and immunogenetics) x 35 years; happy.      Tobacco; none      Alcohol: none      Drugs; none      Exercise:  6 days per week for 45 minutes.  Cardio twice weekly; weightlifting 5 days per week.      Seatbelt: 100%      Guns:  Locked and loaded.   Review of Systems  Constitutional: Positive for fever and chills.  Gastrointestinal: Positive for abdominal pain. Negative for nausea, vomiting, diarrhea and blood in stool.  Musculoskeletal: Positive for arthralgias.  Skin: Positive for color change and rash.   Objective:   Filed Vitals:   01/03/14 1445  BP: 126/74  Pulse: 109  Temp: 100.1 F (37.8 C)  TempSrc: Oral  Resp: 17  Height: 5' 7"  (1.702 m)  Weight: 169 lb (76.658 kg)  SpO2: 96%   Physical Exam  Vitals reviewed. Constitutional: He is oriented to person, place, and time. He appears well-developed and well-nourished.  HENT:  Head: Normocephalic and atraumatic.  Eyes: EOM are normal. Pupils are equal, round, and reactive to light.  Neck:  No JVD present. Carotid bruit is not present.  Cardiovascular: Normal rate, regular rhythm and normal heart sounds.   No murmur heard. Pulmonary/Chest: Effort normal and breath sounds normal. He has no rales.  Abdominal: There is tenderness in the right upper quadrant. There is negative Murphy's sign.  Musculoskeletal: He exhibits edema.  Left Hand: diffuse, soft tissue swelling over the 5th and 4th metacarpals, NVI distally, pain with grip, pain with ROM with slight guarding. Right Hand: slight, soft tissue swelling over the base of 2nd metacarpal, pain with ROM. Elbows and shoulders  non-tender with FROM. 1+ edema, bilateral lower extremities. Left Knee: Full extension, pain over the dorsal knee with flexion but able to move through this ROM without difficult. Right Knee: Flexion and extension is intact with pain over the rash only, not internally.  Neurological: He is alert and oriented to person, place, and time.  Skin: Skin is warm and dry.  Left knee he has a 10 x 10 cm erythematous, indurated patch over the anterior upper knee and distal quad area without fluctuance. Right knee: well healed scar over the anterior medial knee with surrounding erythema and darkening that extends approximately 13 cm x 15 cm that is warm and indurated without fluctuance. Lateral aspect of right knee: there's a central, indurated erythematous area that is approximately 3 x 3 cm with erythema that extends 8 x 5 cm.  Psychiatric: He has a normal mood and affect.   Results for orders placed in visit on 01/03/14  POCT CBC      Result Value Ref Range   WBC 10.7 (*) 4.6 - 10.2 K/uL   Lymph, poc 1.6  0.6 - 3.4   POC LYMPH PERCENT 15.0  10 - 50 %L   MID (cbc) 0.5  0 - 0.9   POC MID % 5.0  0 - 12 %M   POC Granulocyte 8.6 (*) 2 - 6.9   Granulocyte percent 80.0  37 - 80 %G   RBC 3.84 (*) 4.69 - 6.13 M/uL   Hemoglobin 10.2 (*) 14.1 - 18.1 g/dL   HCT, POC 34.0 (*) 43.5 - 53.7 %   MCV 88.5  80 - 97 fL   MCH, POC  26.6 (*) 27 - 31.2 pg   MCHC 30.1 (*) 31.8 - 35.4 g/dL   RDW, POC 18.2     Platelet Count, POC 717 (*) 142 - 424 K/uL   MPV 6.2  0 - 99.8 fL    Assessment & Plan:  4:51 PM- Patient informed of current plan for treatment and evaluation and agrees with plan at this time. Phillip Glass is a 58 y.o. male RUQ abdominal pain - Plan: POCT CBC, CT Abdomen Pelvis W Contrast, CANCELED: CT Abdomen Pelvis W Contrast  Fever presenting with conditions classified elsewhere - Plan: POCT CBC, CT Abdomen Pelvis W Contrast  Chills - Plan: POCT CBC, CT Abdomen Pelvis W Contrast  Pain in joint, ankle and foot, unspecified laterality - Plan: POCT CBC, HYDROcodone-acetaminophen (NORCO/VICODIN) 5-325 MG per tablet, CT Abdomen Pelvis W Contrast  Knee pain, unspecified laterality - Plan: HYDROcodone-acetaminophen (NORCO/VICODIN) 5-325 MG per tablet, CT Abdomen Pelvis W Contrast  Erythema nodosum - Plan: CT Abdomen Pelvis W Contrast  Ulcerative colitis, unspecified complication - Plan: CT Abdomen Pelvis W Contrast, CANCELED: CT Abdomen Pelvis W Contrast   Hemoglobin improved from prior, but now with leukocytosis, slight left shift, low grade fever in office, chills at home and recurrence of abdominal pain over past 3 days. Concerning for colitis flair, and will setup CT abd/pelvis tonight to r/o abscess or ather acute abdominal cause of f/c and elevated WBC.  Rash on legs still appears to be E. nodosum related to underlying colitis, and with increasing pain. Referred to rheumatology, dermatology, but appointments not for approximately 1 month. Offered hospitalization for further eval and pain control but declined at this time. Will start lortab 5/325 1-2 Q6h prn, SED.  Further plan TBD based on CT abd/pelvis.   9:42 PM Report received: IMPRESSION:  Wall thickening of the rectosigmoid colon to the  anus with  surrounding inflammatory change consistent with the patient's  history of inflammatory colitis. The  patient is severely constipated  with a massive volume of stool throughout the colon to the  rectosigmoid. No abscess.  More focal wall thickening in the rectosigmoid colon is identified  which may be due to incomplete distention. Correlation with  endoscopy would be useful.  Atherosclerosis.   Called pt - Left VM on machine with general results, but will call back to discuss further. Plan on discussing with his gastroenterologist tomorrow regarding these findings, and possible rheum consult as well to determine next step. O/N ER precautions given.   9:35 AM 01/04/14 addendum Slightly better this morning, less upper abdominal pain and no fever. Missed call from his gastroenterologist last night, as in CT scan.  GI in [place] - Dr. Alonza Bogus. Called him to discuss further: suspected Crohn's colitis prior, left sided, so does not think LUQ pain related to colitis. May have mild flair of colitis, but no new meds recommended. Ok to restart nsaid or cox 2 inhibitor for arthralgias - will start Mobic 7.66m-15mg qd, continue PPI, and increase Miralax to BID for now (on QD for now) for constipation. He will follow up with him in November, unless emergency sooner. Will try to get him into rheumatology sooner.  Plan to follow up with Dr. LMarin Commenttomorrow.    Meds ordered this encounter  Medications  . HYDROcodone-acetaminophen (NORCO/VICODIN) 5-325 MG per tablet    Sig: Take 1-2 tablets by mouth every 6 (six) hours as needed for moderate pain.    Dispense:  20 tablet    Refill:  0   Patient Instructions  CT scan tonight, change Ultram to hydrocodone - 1 to 2 every 6 hours if needed for pain. Further workup to be determined after cat scan.   Return to the clinic or go to the nearest emergency room if any of your symptoms worsen or new symptoms occur.   I will have you out of work until Monday at this point, but may need to adjust time out based on your symptoms.

## 2014-01-04 ENCOUNTER — Other Ambulatory Visit (INDEPENDENT_AMBULATORY_CARE_PROVIDER_SITE_OTHER): Payer: BC Managed Care – PPO | Admitting: *Deleted

## 2014-01-04 ENCOUNTER — Telehealth: Payer: Self-pay

## 2014-01-04 DIAGNOSIS — R1011 Right upper quadrant pain: Secondary | ICD-10-CM

## 2014-01-04 DIAGNOSIS — D649 Anemia, unspecified: Secondary | ICD-10-CM

## 2014-01-04 LAB — POCT CBC
Granulocyte percent: 74.1 %G (ref 37–80)
HCT, POC: 32.6 % — AB (ref 43.5–53.7)
Hemoglobin: 9.4 g/dL — AB (ref 14.1–18.1)
Lymph, poc: 1.7 (ref 0.6–3.4)
MCH, POC: 26.2 pg — AB (ref 27–31.2)
MCHC: 28.9 g/dL — AB (ref 31.8–35.4)
MCV: 90.6 fL (ref 80–97)
MID (cbc): 0.5 (ref 0–0.9)
MPV: 6.8 fL (ref 0–99.8)
POC Granulocyte: 6.2 (ref 2–6.9)
POC LYMPH PERCENT: 20.3 %L (ref 10–50)
POC MID %: 5.6 % (ref 0–12)
Platelet Count, POC: 653 10*3/uL — AB (ref 142–424)
RBC: 3.59 M/uL — AB (ref 4.69–6.13)
RDW, POC: 18.3 %
WBC: 8.4 10*3/uL (ref 4.6–10.2)

## 2014-01-04 LAB — COMPLETE METABOLIC PANEL WITH GFR
ALK PHOS: 95 U/L (ref 39–117)
ALT: 28 U/L (ref 0–53)
AST: 48 U/L — ABNORMAL HIGH (ref 0–37)
Albumin: 3.3 g/dL — ABNORMAL LOW (ref 3.5–5.2)
BUN: 15 mg/dL (ref 6–23)
CO2: 30 meq/L (ref 19–32)
CREATININE: 1.03 mg/dL (ref 0.50–1.35)
Calcium: 8.7 mg/dL (ref 8.4–10.5)
Chloride: 93 mEq/L — ABNORMAL LOW (ref 96–112)
GFR, Est Non African American: 80 mL/min
GLUCOSE: 88 mg/dL (ref 70–99)
Potassium: 4.2 mEq/L (ref 3.5–5.3)
Sodium: 135 mEq/L (ref 135–145)
Total Bilirubin: 0.4 mg/dL (ref 0.2–1.2)
Total Protein: 6.6 g/dL (ref 6.0–8.3)

## 2014-01-04 MED ORDER — MELOXICAM 7.5 MG PO TABS: 7.5000 mg | ORAL_TABLET | Freq: Every day | ORAL | Status: DC

## 2014-01-04 NOTE — Telephone Encounter (Signed)
Per Dr. Carlota Raspberry, Spoke with Dr. Melissa Noon office and got pt's appt moved up to [facility] 10/13 @ 9am. Pt notified.

## 2014-01-05 ENCOUNTER — Ambulatory Visit (INDEPENDENT_AMBULATORY_CARE_PROVIDER_SITE_OTHER): Payer: BC Managed Care – PPO | Admitting: Family Medicine

## 2014-01-05 VITALS — BP 118/70 | HR 83 | Temp 98.4°F | Resp 16 | Ht 65.5 in | Wt 166.6 lb

## 2014-01-05 DIAGNOSIS — K50118 Crohn's disease of large intestine with other complication: Secondary | ICD-10-CM

## 2014-01-05 DIAGNOSIS — D509 Iron deficiency anemia, unspecified: Secondary | ICD-10-CM

## 2014-01-05 DIAGNOSIS — K5909 Other constipation: Secondary | ICD-10-CM

## 2014-01-05 DIAGNOSIS — M25579 Pain in unspecified ankle and joints of unspecified foot: Secondary | ICD-10-CM

## 2014-01-05 NOTE — Progress Notes (Signed)
 Chief Complaint:  Chief Complaint  Patient presents with  . Follow-up    For knees and ankles    HPI: Phillip Glass is a 58 y.o. male who is here for a recheck of his diffuse joint pain with associated bilateral  edema and also erythematous patchy rash on bilateral knees, RUQ abd pain, constipation .  He had a small BM today, paissing gas. Marland Kitchen He denies being febrile, he has less pain in the RUQ, he has not had any fevers or chills. He had an extensive workup with Dr Nyoka Cowden on 01/03/14 and he also spoke with his GI doctor. Spoke with Dr Merlene Pulling and he states it is Crohns disease and not ulcerative colitis based on prior notes. Again he is doing about the same or slightly better, no fevers or chills, less abd pain. He is doing the same in terms of rash on his knees and also the pain but more manageable since he is not at work. Currently taking mobic , norco prn, and also miralax BID . He also got a CT scan of Abd/pelvis which shoed the following:   IMPRESSION:  Wall thickening of the rectosigmoid colon to the anus with  surrounding inflammatory change consistent with the patient's  history of inflammatory colitis. The patient is severely constipated  with a massive volume of stool throughout the colon to the  rectosigmoid. No abscess.  More focal wall thickening in the rectosigmoid colon is identified  which may be due to incomplete distention. Correlation with  endoscopy would be useful.  Atherosclerosis.   Wt Readings from Last 3 Encounters:  01/05/14 166 lb 9.6 oz (75.569 kg)  01/03/14 169 lb (76.658 kg)  12/29/13 170 lb (77.111 kg)      Past Medical History  Diagnosis Date  . Crohn's colitis    Past Surgical History  Procedure Laterality Date  . Colonoscopy  01/12/2011  . Tonsillectomy     History   Social History  . Marital Status: Legally Separated    Spouse Name: N/A    Number of Children: N/A  . Years of Education: N/A   Social History Main Topics  .  Smoking status: Never Smoker   . Smokeless tobacco: Never Used  . Alcohol Use: No  . Drug Use: No  . Sexual Activity: No   Other Topics Concern  . None   Social History Narrative   Marital status: divorced in 2013.  Not dating.  Married x 20 years.      Children:  1 child (22); no grandchild.      Lives: with son.      Employment: Scientist, clinical (histocompatibility and immunogenetics) x 35 years; happy.      Tobacco; none      Alcohol: none      Drugs; none      Exercise:  6 days per week for 45 minutes.  Cardio twice weekly; weightlifting 5 days per week.      Seatbelt: 100%      Guns:  Locked and loaded.   Family History  Problem Relation Age of Onset  . Hypertension Father   . Heart disease Father 49    AMI age 55; second  AMI age 21 cause of death  . Diabetes Sister   . Dementia Mother   . Hypertension Mother    Allergies  Allergen Reactions  . Codeine Other (See Comments)    Pt states he hallucinates   Prior to Admission medications   Medication  Sig Start Date End Date Taking? Authorizing Provider  Aloe Vera 500 MG CAPS Take 1 capsule by mouth daily.   Yes Historical Provider, MD  balsalazide (COLAZAL) 750 MG capsule Take 4,500 mg by mouth 2 (two) times daily.   Yes Historical Provider, MD  Bioflavonoid Products (BIOFLEX PO) Take 1 tablet by mouth daily.   Yes Historical Provider, MD  diltiazem 2 % GEL Apply topically 2 (two) times daily.   Yes Historical Provider, MD  doxycycline (VIBRA-TABS) 100 MG tablet Take 1 tablet (100 mg total) by mouth 2 (two) times daily. 12/28/13  Yes [name], MD  Ferrous Gluconate (IRON) 240 (27 FE) MG TABS Take 1 tablet by mouth daily.   Yes Historical Provider, MD  Garlic 528 MG TABS Take 1 tablet by mouth daily.   Yes Historical Provider, MD  hydrochlorothiazide (HYDRODIURIL) 25 MG tablet Take 1 tablet (25 mg total) by mouth daily. 12/25/13  Yes Roselee Culver, MD  HYDROcodone-acetaminophen (NORCO/VICODIN) 5-325 MG per tablet Take 1-2 tablets by mouth every 6  (six) hours as needed for moderate pain. 01/03/14  Yes [name], MD  meloxicam (MOBIC) 7.5 MG tablet Take 1-2 tablets (7.5-15 mg total) by mouth daily. 01/04/14  Yes [name], MD  mesalamine (CANASA) 1000 MG suppository Place 1,000 mg rectally at bedtime.   Yes Historical Provider, MD  Multiple Vitamins-Minerals (MULTIVITAMIN WITH MINERALS) tablet Take 1 tablet by mouth daily.   Yes Historical Provider, MD  omeprazole (PRILOSEC) 20 MG capsule Take 1 capsule (20 mg total) by mouth daily. 12/28/13  Yes [name], MD  Specialty Vitamins Products (ONE-A-DAY CHOLESTEROL) TABS Take 1 tablet by mouth daily.   Yes Historical Provider, MD     ROS: The patient denies fevers, chills, night sweats, unintentional weight loss, chest pain, palpitations, wheezing, dyspnea on exertion, nausea, vomiting, abdominal pain, dysuria, hematuria, melena, numbness, weakness, or tingling. + minimal BRBPR on TP like he normally has, no increase since last visit.   All other systems have been reviewed and were otherwise negative with the exception of those mentioned in the HPI and as above.    PHYSICAL EXAM: Filed Vitals:   01/05/14 1133  BP: 118/70  Pulse: 83  Temp: 98.4 F (36.9 C)  Resp: 16   Filed Vitals:   01/05/14 1133  Height: 5' 5.5" (1.664 m)  Weight: 166 lb 9.6 oz (75.569 kg)   Body mass index is 27.29 kg/(m^2).  General: Alert, no acute distress HEENT:  Normocephalic, atraumatic, oropharynx patent. EOMI, PERRLA Cardiovascular:  Regular rate and rhythm, no rubs murmurs or gallops.  No Carotid bruits, radial pulse intact. + trace pedal edema.  Respiratory: Clear to auscultation bilaterally.  No wheezes, rales, or rhonchi.  No cyanosis, no use of accessory musculature GI: No organomegaly, abdomen is soft and non-tender, positive bowel sounds.  No masses. Skin: + erthematous purplish patch macular rash on bilateral knees c/w erythema nodosum, no e/o of p. granulosum Neurologic:  Facial musculature symmetric. Psychiatric: Patient is appropriate throughout our interaction. Lymphatic: No cervical lymphadenopathy Musculoskeletal: Gait intact.   LABS: Results for orders placed in visit on 01/04/14  COMPLETE METABOLIC PANEL WITH GFR      Result Value Ref Range   Sodium 135  135 - 145 mEq/L   Potassium 4.2  3.5 - 5.3 mEq/L   Chloride 93 (*) 96 - 112 mEq/L   CO2 30  19 - 32 mEq/L   Glucose, Bld 88  70 - 99  mg/dL   BUN 15  6 - 23 mg/dL   Creat 1.03  0.50 - 1.35 mg/dL   Total Bilirubin 0.4  0.2 - 1.2 mg/dL   Alkaline Phosphatase 95  39 - 117 U/L   AST 48 (*) 0 - 37 U/L   ALT 28  0 - 53 U/L   Total Protein 6.6  6.0 - 8.3 g/dL   Albumin 3.3 (*) 3.5 - 5.2 g/dL   Calcium 8.7  8.4 - 10.5 mg/dL   GFR, Est African American >89     GFR, Est Non African American 80    POCT CBC      Result Value Ref Range   WBC 8.4  4.6 - 10.2 K/uL   Lymph, poc 1.7  0.6 - 3.4   POC LYMPH PERCENT 20.3  10 - 50 %L   MID (cbc) 0.5  0 - 0.9   POC MID % 5.6  0 - 12 %M   POC Granulocyte 6.2  2 - 6.9   Granulocyte percent 74.1  37 - 80 %G   RBC 3.59 (*) 4.69 - 6.13 M/uL   Hemoglobin 9.4 (*) 14.1 - 18.1 g/dL   HCT, POC 32.6 (*) 43.5 - 53.7 %   MCV 90.6  80 - 97 fL   MCH, POC 26.2 (*) 27 - 31.2 pg   MCHC 28.9 (*) 31.8 - 35.4 g/dL   RDW, POC 18.3     Platelet Count, POC 653 (*) 142 - 424 K/uL   MPV 6.8  0 - 99.8 fL     EKG/XRAY:   Primary read interpreted by Dr. Marin Comment at [facility].   ASSESSMENT/PLAN: Encounter Diagnoses  Name Primary?  . Pain in joint, ankle and foot, unspecified laterality Yes  . Other constipation   . Iron deficiency anemia   . Crohn's colitis, other complication    58 y/o male with crohns colitis and systemic dermatological and arthritic manifestations of the inflammatory disease He is doing about the same, afebrile, has had BM and has Flatus but still not very large BM He will cont with miralax BID, advise that norco causes constipation and so does his iron pills  BID , add colace prn if miralax does not help Continue with PPI, mobic and norco prn pain He has a rheumatology appt on Tuesday with  Dr Amil Amen Work note extended, Disability paperwork has been requested--He will return to see [place] on Wednesday, Dr Carlota Raspberry or myself for follow-up. I will defer any recheck of his Hgb to make sure it is tredning up until Tueday either by Dr Amil Amen or on Wednesday at [facility] Consider checking  vitamin B12 check to his blood count at some point to see if he ahs B12 deficiency F/u sooner prn   Gross sideeffects, risk and benefits, and alternatives of medications d/w patient. Patient is aware that all medications have potential sideeffects and we are unable to predict every sideeffect or drug-drug interaction that may occur.  , [place], DO 01/06/2014 6:21 AM

## 2014-01-05 NOTE — Patient Instructions (Signed)
Docusate capsules What is this medicine? DOCUSATE (doc CUE sayt) is stool softener. It helps prevent constipation and straining or discomfort associated with hard or dry stools. This medicine may be used for other purposes; ask your health care provider or pharmacist if you have questions. COMMON BRAND NAME(S): Colace, Colace Clear, Correctol, D.O.S., DC, Doc-Q-Lace, DocuLace, Docusoft S, DOK, Dulcolax, Genasoft, Kao-Tin, Kaopectate Liqui-Gels, Phillips Stool Softener, Stool Softener, Stool Softner DC, Sulfolax, Sur-Q-Lax, Surfak, Uni-Ease What should I tell my health care provider before I take this medicine? They need to know if you have any of these conditions: -nausea or vomiting -severe constipation -stomach pain -sudden change in bowel habit lasting more than 2 weeks -an unusual or allergic reaction to docusate, other medicines, foods, dyes, or preservatives -pregnant or trying to get pregnant -breast-feeding How should I use this medicine? Take this medicine by mouth with a glass of water. Follow the directions on the label. Take your doses at regular intervals. Do not take your medicine more often than directed. Talk to your pediatrician regarding the use of this medicine in children. While this medicine may be prescribed for children as young as 2 years for selected conditions, precautions do apply. Overdosage: If you think you have taken too much of this medicine contact a poison control center or emergency room at once. NOTE: This medicine is only for you. Do not share this medicine with others. What if I miss a dose? If you miss a dose, take it as soon as you can. If it is almost time for your next dose, take only that dose. Do not take double or extra doses. What may interact with this medicine? -mineral oil This list may not describe all possible interactions. Give your health care provider a list of all the medicines, herbs, non-prescription drugs, or dietary supplements you  use. Also tell them if you smoke, drink alcohol, or use illegal drugs. Some items may interact with your medicine. What should I watch for while using this medicine? Do not use for more than one week without advice from your doctor or health care professional. If your constipation returns, check with your doctor or health care professional. Drink plenty of water while taking this medicine. Drinking water helps decrease constipation. Stop using this medicine and contact your doctor or health care professional if you experience any rectal bleeding or do not have a bowel movement after use. These could be signs of a more serious condition. What side effects may I notice from receiving this medicine? Side effects that you should report to your doctor or health care professional as soon as possible: -allergic reactions like skin rash, itching or hives, swelling of the face, lips, or tongue Side effects that usually do not require medical attention (report to your doctor or health care professional if they continue or are bothersome): -diarrhea -stomach cramps -throat irritation This list may not describe all possible side effects. Call your doctor for medical advice about side effects. You may report side effects to FDA at 1-800-FDA-1088. Where should I keep my medicine? Keep out of the reach of children. Store at room temperature between 15 and 30 degrees C (59 and 86 degrees F). Throw away any unused medicine after the expiration date. NOTE: This sheet is a summary. It may not cover all possible information. If you have questions about this medicine, talk to your doctor, pharmacist, or health care provider.  2015, Elsevier/Gold Standard. (2007-07-07 15:56:49) Constipation Constipation is when a person has fewer than  three bowel movements a week, has difficulty having a bowel movement, or has stools that are dry, hard, or larger than normal. As people grow older, constipation is more common. If you  try to fix constipation with medicines that make you have a bowel movement (laxatives), the problem may get worse. Long-term laxative use may cause the muscles of the colon to become weak. A low-fiber diet, not taking in enough fluids, and taking certain medicines may make constipation worse.  CAUSES   Certain medicines, such as antidepressants, pain medicine, iron supplements, antacids, and water pills.   Certain diseases, such as diabetes, irritable bowel syndrome (IBS), thyroid disease, or depression.   Not drinking enough water.   Not eating enough fiber-rich foods.   Stress or travel.   Lack of physical activity or exercise.   Ignoring the urge to have a bowel movement.   Using laxatives too much.  SIGNS AND SYMPTOMS   Having fewer than three bowel movements a week.   Straining to have a bowel movement.   Having stools that are hard, dry, or larger than normal.   Feeling full or bloated.   Pain in the lower abdomen.   Not feeling relief after having a bowel movement.  DIAGNOSIS  Your health care provider will take a medical history and perform a physical exam. Further testing may be done for severe constipation. Some tests may include:  A barium enema X-ray to examine your rectum, colon, and, sometimes, your small intestine.   A sigmoidoscopy to examine your lower colon.   A colonoscopy to examine your entire colon. TREATMENT  Treatment will depend on the severity of your constipation and what is causing it. Some dietary treatments include drinking more fluids and eating more fiber-rich foods. Lifestyle treatments may include regular exercise. If these diet and lifestyle recommendations do not help, your health care provider may recommend taking over-the-counter laxative medicines to help you have bowel movements. Prescription medicines may be prescribed if over-the-counter medicines do not work.  HOME CARE INSTRUCTIONS   Eat foods that have a lot of  fiber, such as fruits, vegetables, whole grains, and beans.  Limit foods high in fat and processed sugars, such as french fries, hamburgers, cookies, candies, and soda.   A fiber supplement may be added to your diet if you cannot get enough fiber from foods.   Drink enough fluids to keep your urine clear or pale yellow.   Exercise regularly or as directed by your health care provider.   Go to the restroom when you have the urge to go. Do not hold it.   Only take over-the-counter or prescription medicines as directed by your health care provider. Do not take other medicines for constipation without talking to your health care provider first.  [place] IF:   You have bright red blood in your stool.   Your constipation lasts for more than 4 days or gets worse.   You have abdominal or rectal pain.   You have thin, pencil-like stools.   You have unexplained weight loss. MAKE SURE YOU:   Understand these instructions.  Will watch your condition.  Will get help right away if you are not doing well or get worse. Document Released: 12/13/2003 Document Revised: 03/21/2013 Document Reviewed: 12/26/2012 [facility] Patient Information 2015 [place], [place]. This information is not intended to replace advice given to you by your health care provider. Make sure you discuss any questions you have with your health care provider.

## 2014-01-08 ENCOUNTER — Telehealth: Payer: Self-pay | Admitting: Family Medicine

## 2014-01-08 DIAGNOSIS — Z029 Encounter for administrative examinations, unspecified: Secondary | ICD-10-CM

## 2014-01-08 NOTE — Telephone Encounter (Signed)
FMLA ppw completed. In disabilities tray to be scanned. Also posted $15.00 payment in cash drawer

## 2014-01-09 ENCOUNTER — Encounter: Payer: Self-pay | Admitting: Family Medicine

## 2014-01-10 ENCOUNTER — Ambulatory Visit (INDEPENDENT_AMBULATORY_CARE_PROVIDER_SITE_OTHER): Payer: BC Managed Care – PPO | Admitting: Family Medicine

## 2014-01-10 VITALS — BP 110/60 | HR 93 | Temp 98.4°F | Resp 16 | Ht 66.0 in | Wt 169.0 lb

## 2014-01-10 DIAGNOSIS — D500 Iron deficiency anemia secondary to blood loss (chronic): Secondary | ICD-10-CM

## 2014-01-10 DIAGNOSIS — M2550 Pain in unspecified joint: Secondary | ICD-10-CM

## 2014-01-10 DIAGNOSIS — M779 Enthesopathy, unspecified: Secondary | ICD-10-CM

## 2014-01-10 DIAGNOSIS — K50118 Crohn's disease of large intestine with other complication: Secondary | ICD-10-CM

## 2014-01-10 NOTE — Progress Notes (Signed)
Chief Complaint:  Chief Complaint  Patient presents with  . Follow-up    Joint pain    HPI: Phillip Glass is a 58 y.o. male who is here for  Recheck of his diffuse joint pain and also crohns colitis. He saw Dr Amil Amen yesterday for a cinsultation and labs were done at that time, they will be sent to [place]. Dr Amil Amen  thinks he may have RA, he will get in touch with his GI doctor to discuss option once the test results return.  According to Phillip Glass he thinks Dr Amil Amen is consiering putting him on Humira as an option if RA tests are +. He weill know next Tuesday. He currently is back on prednisone and is doing better than when he was on mobic. He has stopped his mobic. He denies fevers, chills, more BRBPR , CP or SOB   Past Medical History  Diagnosis Date  . Crohn's colitis    Past Surgical History  Procedure Laterality Date  . Colonoscopy  01/12/2011  . Tonsillectomy     History   Social History  . Marital Status: Legally Separated    Spouse Name: N/A    Number of Children: N/A  . Years of Education: N/A   Social History Main Topics  . Smoking status: Never Smoker   . Smokeless tobacco: Never Used  . Alcohol Use: No  . Drug Use: No  . Sexual Activity: No   Other Topics Concern  . None   Social History Narrative   Marital status: divorced in 2013.  Not dating.  Married x 20 years.      Children:  1 child (22); no grandchild.      Lives: with son.      Employment: Scientist, clinical (histocompatibility and immunogenetics) x 35 years; happy.      Tobacco; none      Alcohol: none      Drugs; none      Exercise:  6 days per week for 45 minutes.  Cardio twice weekly; weightlifting 5 days per week.      Seatbelt: 100%      Guns:  Locked and loaded.   Family History  Problem Relation Age of Onset  . Hypertension Father   . Heart disease Father 17    AMI age 62; second  AMI age 50 cause of death  . Diabetes Sister   . Dementia Mother   . Hypertension Mother    Allergies  Allergen Reactions    . Codeine Other (See Comments)    Pt states he hallucinates   Prior to Admission medications   Medication Sig Start Date End Date Taking? Authorizing Provider  Aloe Vera 500 MG CAPS Take 1 capsule by mouth daily.   Yes Historical Provider, MD  balsalazide (COLAZAL) 750 MG capsule Take 4,500 mg by mouth 2 (two) times daily.   Yes Historical Provider, MD  Bioflavonoid Products (BIOFLEX PO) Take 1 tablet by mouth daily.   Yes Historical Provider, MD  Ferrous Gluconate (IRON) 240 (27 FE) MG TABS Take 1 tablet by mouth daily.   Yes Historical Provider, MD  Garlic 078 MG TABS Take 1 tablet by mouth daily.   Yes Historical Provider, MD  hydrochlorothiazide (HYDRODIURIL) 25 MG tablet Take 1 tablet (25 mg total) by mouth daily. 12/25/13  Yes Roselee Culver, MD  HYDROcodone-acetaminophen (NORCO/VICODIN) 5-325 MG per tablet Take 1-2 tablets by mouth every 6 (six) hours as needed for moderate pain. 01/03/14  Yes  [name], MD  meloxicam (MOBIC) 7.5 MG tablet Take 1-2 tablets (7.5-15 mg total) by mouth daily. 01/04/14  Yes [name], MD  mesalamine (CANASA) 1000 MG suppository Place 1,000 mg rectally at bedtime.   Yes Historical Provider, MD  Multiple Vitamins-Minerals (MULTIVITAMIN WITH MINERALS) tablet Take 1 tablet by mouth daily.   Yes Historical Provider, MD  omeprazole (PRILOSEC) 20 MG capsule Take 1 capsule (20 mg total) by mouth daily. 12/28/13  Yes [name], MD  Specialty Vitamins Products (ONE-A-DAY CHOLESTEROL) TABS Take 1 tablet by mouth daily.   Yes Historical Provider, MD     ROS: The patient denies fevers, chills, night sweats, unintentional weight loss, chest pain, palpitations, wheezing, dyspnea on exertion, nausea, vomiting, abdominal pain, dysuria, hematuria, melena, numbness, weakness, or tingling.   All other systems have been reviewed and were otherwise negative with the exception of those mentioned in the HPI and as above.    PHYSICAL EXAM: Filed Vitals:    01/10/14 1354  BP: 110/60  Pulse: 93  Temp: 98.4 F (36.9 C)  Resp: 16   Filed Vitals:   01/10/14 1354  Height: 5' 6"  (1.676 m)  Weight: 169 lb (76.658 kg)   Body mass index is 27.29 kg/(m^2).  General: Alert, no acute distress HEENT:  Normocephalic, atraumatic, oropharynx patent. EOMI, PERRLA Cardiovascular:  Regular rate and rhythm, no rubs murmurs or gallops.  + trace pedal edema.  Respiratory: Clear to auscultation bilaterally.  No wheezes, rales, or rhonchi.  No cyanosis, no use of accessory musculature GI: No organomegaly, abdomen is soft and non-tender, positive bowel sounds.  No masses. Skin: + erythematous macular rash on knees Neurologic: Facial musculature symmetric. Psychiatric: Patient is appropriate throughout our interaction. Lymphatic: No cervical lymphadenopathy Musculoskeletal: Gait intact.   LABS: Results for orders placed in visit on 01/04/14  COMPLETE METABOLIC PANEL WITH GFR      Result Value Ref Range   Sodium 135  135 - 145 mEq/L   Potassium 4.2  3.5 - 5.3 mEq/L   Chloride 93 (*) 96 - 112 mEq/L   CO2 30  19 - 32 mEq/L   Glucose, Bld 88  70 - 99 mg/dL   BUN 15  6 - 23 mg/dL   Creat 1.03  0.50 - 1.35 mg/dL   Total Bilirubin 0.4  0.2 - 1.2 mg/dL   Alkaline Phosphatase 95  39 - 117 U/L   AST 48 (*) 0 - 37 U/L   ALT 28  0 - 53 U/L   Total Protein 6.6  6.0 - 8.3 g/dL   Albumin 3.3 (*) 3.5 - 5.2 g/dL   Calcium 8.7  8.4 - 10.5 mg/dL   GFR, Est African American >89     GFR, Est Non African American 80    POCT CBC      Result Value Ref Range   WBC 8.4  4.6 - 10.2 K/uL   Lymph, poc 1.7  0.6 - 3.4   POC LYMPH PERCENT 20.3  10 - 50 %L   MID (cbc) 0.5  0 - 0.9   POC MID % 5.6  0 - 12 %M   POC Granulocyte 6.2  2 - 6.9   Granulocyte percent 74.1  37 - 80 %G   RBC 3.59 (*) 4.69 - 6.13 M/uL   Hemoglobin 9.4 (*) 14.1 - 18.1 g/dL   HCT, POC 32.6 (*) 43.5 - 53.7 %   MCV 90.6  80 - 97 fL   MCH,  POC 26.2 (*) 27 - 31.2 pg   MCHC 28.9 (*) 31.8 - 35.4 g/dL    RDW, POC 18.3     Platelet Count, POC 653 (*) 142 - 424 K/uL   MPV 6.8  0 - 99.8 fL     EKG/XRAY:   Primary read interpreted by Dr. Marin Comment at [facility].   ASSESSMENT/PLAN: Encounter Diagnoses  Name Primary?  . Crohn's colitis, other complication Yes  . Inflammation around joint   . Polyarthralgia   . Iron deficiency anemia due to chronic blood loss    He will be out of work until 01/23/14 He can asks for letter if needed for [place], I am not sure if we need to see him regular once GI and Rheumatology has seen him, he can always request a work note by phone if it is within reason GI and Rheuamtology foowup as instructed F/u prn  Gross sideeffects, risk and benefits, and alternatives of medications d/w patient. Patient is aware that all medications have potential sideeffects and we are unable to predict every sideeffect or drug-drug interaction that may occur.  Markelle Najarian, [place], DO 01/10/2014 2:12 PM

## 2014-01-18 ENCOUNTER — Encounter: Payer: Self-pay | Admitting: Family Medicine

## 2014-01-18 DIAGNOSIS — M2550 Pain in unspecified joint: Secondary | ICD-10-CM | POA: Insufficient documentation

## 2014-01-22 ENCOUNTER — Ambulatory Visit (INDEPENDENT_AMBULATORY_CARE_PROVIDER_SITE_OTHER): Payer: BC Managed Care – PPO

## 2014-01-22 DIAGNOSIS — Z23 Encounter for immunization: Secondary | ICD-10-CM

## 2014-02-01 ENCOUNTER — Ambulatory Visit (INDEPENDENT_AMBULATORY_CARE_PROVIDER_SITE_OTHER): Payer: BC Managed Care – PPO | Admitting: Family Medicine

## 2014-02-01 VITALS — BP 130/84 | HR 65 | Temp 98.5°F | Resp 18 | Ht 67.25 in | Wt 172.8 lb

## 2014-02-01 DIAGNOSIS — M064 Inflammatory polyarthropathy: Secondary | ICD-10-CM

## 2014-02-01 DIAGNOSIS — K50118 Crohn's disease of large intestine with other complication: Secondary | ICD-10-CM

## 2014-02-01 DIAGNOSIS — M2550 Pain in unspecified joint: Secondary | ICD-10-CM

## 2014-02-01 DIAGNOSIS — M1990 Unspecified osteoarthritis, unspecified site: Secondary | ICD-10-CM

## 2014-02-01 NOTE — Progress Notes (Signed)
Chief Complaint:  Chief Complaint  Patient presents with  . Follow-up    with Dr. Marin Comment. Pt. has paperwork to be signed so that he can return to work. Pt. also states he will be starting Humira on Monday.     HPI: Phillip Glass is a 58 y.o. male who is here for paperwork He is planning to get Humira, and also continue Prednisone taper  from Dr Amil Amen, he is to get his first Humira injection next week and then will need time to see if he has any SEs.  He is doing better with  His joint pain, he denies having any abd pain, diarrhea,worsening jt pain.  He needs paper work to be filled out.    Past Medical History  Diagnosis Date  . Crohn's colitis    Past Surgical History  Procedure Laterality Date  . Colonoscopy  01/12/2011  . Tonsillectomy     History   Social History  . Marital Status: Legally Separated    Spouse Name: N/A    Number of Children: N/A  . Years of Education: N/A   Social History Main Topics  . Smoking status: Never Smoker   . Smokeless tobacco: Never Used  . Alcohol Use: No  . Drug Use: No  . Sexual Activity: No   Other Topics Concern  . None   Social History Narrative   Marital status: divorced in 2013.  Not dating.  Married x 20 years.      Children:  1 child (22); no grandchild.      Lives: with son.      Employment: Scientist, clinical (histocompatibility and immunogenetics) x 35 years; happy.      Tobacco; none      Alcohol: none      Drugs; none      Exercise:  6 days per week for 45 minutes.  Cardio twice weekly; weightlifting 5 days per week.      Seatbelt: 100%      Guns:  Locked and loaded.   Family History  Problem Relation Age of Onset  . Hypertension Father   . Heart disease Father 67    AMI age 48; second  AMI age 28 cause of death  . Diabetes Sister   . Dementia Mother   . Hypertension Mother    Allergies  Allergen Reactions  . Codeine Other (See Comments)    Pt states he hallucinates   Prior to Admission medications   Medication Sig Start Date End  Date Taking? Authorizing Provider  Aloe Vera 500 MG CAPS Take 1 capsule by mouth daily.   Yes Historical Provider, MD  balsalazide (COLAZAL) 750 MG capsule Take 4,500 mg by mouth 2 (two) times daily.   Yes Historical Provider, MD  Bioflavonoid Products (BIOFLEX PO) Take 1 tablet by mouth daily.   Yes Historical Provider, MD  Ferrous Gluconate (IRON) 240 (27 FE) MG TABS Take 1 tablet by mouth daily.   Yes Historical Provider, MD  Garlic 542 MG TABS Take 1 tablet by mouth daily.   Yes Historical Provider, MD  HYDROcodone-acetaminophen (NORCO/VICODIN) 5-325 MG per tablet Take 1-2 tablets by mouth every 6 (six) hours as needed for moderate pain. 01/03/14  Yes [name], MD  mesalamine (CANASA) 1000 MG suppository Place 1,000 mg rectally at bedtime.   Yes Historical Provider, MD  Multiple Vitamins-Minerals (MULTIVITAMIN WITH MINERALS) tablet Take 1 tablet by mouth daily.   Yes Historical Provider, MD  predniSONE (DELTASONE) 20 MG tablet  Take 20 mg by mouth 2 (two) times daily.   Yes Historical Provider, MD  Specialty Vitamins Products (ONE-A-DAY CHOLESTEROL) TABS Take 1 tablet by mouth daily.   Yes Historical Provider, MD  hydrochlorothiazide (HYDRODIURIL) 25 MG tablet Take 1 tablet (25 mg total) by mouth daily. 12/25/13   Roselee Culver, MD  meloxicam (MOBIC) 7.5 MG tablet Take 1-2 tablets (7.5-15 mg total) by mouth daily. 01/04/14   [name], MD  omeprazole (PRILOSEC) 20 MG capsule Take 1 capsule (20 mg total) by mouth daily. 12/28/13   [name], MD     ROS: The patient denies fevers, chills, night sweats, unintentional weight loss, chest pain, palpitations, wheezing, dyspnea on exertion, nausea, vomiting, abdominal pain, dysuria, hematuria, melena, numbness, weakness, or tingling.  All other systems have been reviewed and were otherwise negative with the exception of those mentioned in the HPI and as above.    PHYSICAL EXAM: Filed Vitals:   02/01/14 1226  BP: 130/84    Pulse: 65  Temp: 98.5 F (36.9 C)  Resp: 18   Filed Vitals:   02/01/14 1226  Height: 5' 7.25" (1.708 m)  Weight: 172 lb 12.8 oz (78.382 kg)   Body mass index is 26.87 kg/(m^2).  General: Alert, no acute distress HEENT:  Normocephalic, atraumatic, oropharynx patent. EOMI, PERRLA Cardiovascular:  Regular rate and rhythm, no rubs murmurs or gallops.  No Carotid bruits, radial pulse intact. No pedal edema.  Respiratory: Clear to auscultation bilaterally.  No wheezes, rales, or rhonchi.  No cyanosis, no use of accessory musculature GI: No organomegaly, abdomen is soft and non-tender, positive bowel sounds.  No masses. Skin: No rashes. Neurologic: Facial musculature symmetric. Psychiatric: Patient is appropriate throughout our interaction. Lymphatic: No cervical lymphadenopathy Musculoskeletal: Gait intact.   LABS: Results for orders placed or performed in visit on 01/04/14  COMPLETE METABOLIC PANEL WITH GFR  Result Value Ref Range   Sodium 135 135 - 145 mEq/L   Potassium 4.2 3.5 - 5.3 mEq/L   Chloride 93 (L) 96 - 112 mEq/L   CO2 30 19 - 32 mEq/L   Glucose, Bld 88 70 - 99 mg/dL   BUN 15 6 - 23 mg/dL   Creat 1.03 0.50 - 1.35 mg/dL   Total Bilirubin 0.4 0.2 - 1.2 mg/dL   Alkaline Phosphatase 95 39 - 117 U/L   AST 48 (H) 0 - 37 U/L   ALT 28 0 - 53 U/L   Total Protein 6.6 6.0 - 8.3 g/dL   Albumin 3.3 (L) 3.5 - 5.2 g/dL   Calcium 8.7 8.4 - 10.5 mg/dL   GFR, Est African American >89 mL/min   GFR, Est Non African American 80 mL/min  POCT CBC  Result Value Ref Range   WBC 8.4 4.6 - 10.2 K/uL   Lymph, poc 1.7 0.6 - 3.4   POC LYMPH PERCENT 20.3 10 - 50 %L   MID (cbc) 0.5 0 - 0.9   POC MID % 5.6 0 - 12 %M   POC Granulocyte 6.2 2 - 6.9   Granulocyte percent 74.1 37 - 80 %G   RBC 3.59 (A) 4.69 - 6.13 M/uL   Hemoglobin 9.4 (A) 14.1 - 18.1 g/dL   HCT, POC 32.6 (A) 43.5 - 53.7 %   MCV 90.6 80 - 97 fL   MCH, POC 26.2 (A) 27 - 31.2 pg   MCHC 28.9 (A) 31.8 - 35.4 g/dL   RDW, POC  18.3 %   Platelet Count, POC  653 (A) 142 - 424 K/uL   MPV 6.8 0 - 99.8 fL     EKG/XRAY:   Primary read interpreted by Dr. Marin Comment at [facility].   ASSESSMENT/PLAN: Encounter Diagnoses  Name Primary?  . Crohn's colitis, other complication Yes  . Inflammatory arthritis   . Polyarthralgia    Doing better Forms filled out F/u prn  Gross sideeffects, risk and benefits, and alternatives of medications d/w patient. Patient is aware that all medications have potential sideeffects and we are unable to predict every sideeffect or drug-drug interaction that may occur.  Kaegan Hettich, [place], DO 02/03/2014 11:06 AM

## 2014-02-12 ENCOUNTER — Ambulatory Visit (INDEPENDENT_AMBULATORY_CARE_PROVIDER_SITE_OTHER): Payer: BC Managed Care – PPO | Admitting: Family Medicine

## 2014-02-12 VITALS — BP 122/74 | HR 91 | Temp 98.1°F | Resp 17 | Ht 65.5 in | Wt 174.0 lb

## 2014-02-12 DIAGNOSIS — K50118 Crohn's disease of large intestine with other complication: Secondary | ICD-10-CM

## 2014-02-12 DIAGNOSIS — M25579 Pain in unspecified ankle and joints of unspecified foot: Secondary | ICD-10-CM

## 2014-02-12 NOTE — Progress Notes (Signed)
 Chief Complaint:  Chief Complaint  Patient presents with  . Follow-up    Crohns    HPI: Phillip Glass is a 57 y.o. male who is here for  Forms to be filled out for crohns colitis Will need to return to work on 11/30 since he is getting humira shots on Monday 11/23 and then he is also on prednisone and is afraid that he will have Ses from meds and also has decrease immunity He is worried abotu decrease immune sxs and also infection being on meds, he is somewhat better, has not had any blood in his stools but did have some nausea from [place] pain is better but was getting better on steroids  He is eating well and gaining weight but feels tired and he thinks it is because of deconditioning He would like to return to work but would like it to be after he was done with steroids and also has gotten his second series of humira injections.  No fevers, chills, CP , cough, SOB  Wt Readings from Last 3 Encounters:  02/12/14 174 lb (78.926 kg)  02/01/14 172 lb 12.8 oz (78.382 kg)  01/10/14 169 lb (76.658 kg)   BP Readings from Last 3 Encounters:  02/12/14 122/74  02/01/14 130/84  01/10/14 110/60     Past Medical History  Diagnosis Date  . Crohn's colitis    Past Surgical History  Procedure Laterality Date  . Colonoscopy  01/12/2011  . Tonsillectomy     History   Social History  . Marital Status: Legally Separated    Spouse Name: N/A    Number of Children: N/A  . Years of Education: N/A   Social History Main Topics  . Smoking status: Never Smoker   . Smokeless tobacco: Never Used  . Alcohol Use: No  . Drug Use: No  . Sexual Activity: No   Other Topics Concern  . None   Social History Narrative   Marital status: divorced in 2013.  Not dating.  Married x 20 years.      Children:  1 child (22); no grandchild.      Lives: with son.      Employment: Scientist, clinical (histocompatibility and immunogenetics) x 35 years; happy.      Tobacco; none      Alcohol: none      Drugs; none   Exercise:  6 days per week for 45 minutes.  Cardio twice weekly; weightlifting 5 days per week.      Seatbelt: 100%      Guns:  Locked and loaded.   Family History  Problem Relation Age of Onset  . Hypertension Father   . Heart disease Father 73    AMI age 15; second  AMI age 57 cause of death  . Diabetes Sister   . Dementia Mother   . Hypertension Mother    Allergies  Allergen Reactions  . Codeine Other (See Comments)    Pt states he hallucinates   Prior to Admission medications   Medication Sig Start Date End Date Taking? Authorizing Provider  Adalimumab (HUMIRA) 40 MG/0.8ML PSKT Inject into the skin.   Yes Historical Provider, MD  Aloe Vera 500 MG CAPS Take 1 capsule by mouth daily.   Yes Historical Provider, MD  balsalazide (COLAZAL) 750 MG capsule Take 4,500 mg by mouth 2 (two) times daily.   Yes Historical Provider, MD  Bioflavonoid Products (BIOFLEX PO) Take 1 tablet by mouth daily.   Yes Historical  Provider, MD  Ferrous Gluconate (IRON) 240 (27 FE) MG TABS Take 1 tablet by mouth daily.   Yes Historical Provider, MD  Garlic 532 MG TABS Take 1 tablet by mouth daily.   Yes Historical Provider, MD  mesalamine (CANASA) 1000 MG suppository Place 1,000 mg rectally at bedtime.   Yes Historical Provider, MD  Multiple Vitamins-Minerals (MULTIVITAMIN WITH MINERALS) tablet Take 1 tablet by mouth daily.   Yes Historical Provider, MD  omeprazole (PRILOSEC) 20 MG capsule Take 1 capsule (20 mg total) by mouth daily. 12/28/13  Yes [name], MD  predniSONE (DELTASONE) 20 MG tablet Take 20 mg by mouth 2 (two) times daily.   Yes Historical Provider, MD  Specialty Vitamins Products (ONE-A-DAY CHOLESTEROL) TABS Take 1 tablet by mouth daily.   Yes Historical Provider, MD  hydrochlorothiazide (HYDRODIURIL) 25 MG tablet Take 1 tablet (25 mg total) by mouth daily. 12/25/13   Roselee Culver, MD  HYDROcodone-acetaminophen (NORCO/VICODIN) 5-325 MG per tablet Take 1-2 tablets by mouth every 6  (six) hours as needed for moderate pain. 01/03/14   [name], MD  meloxicam (MOBIC) 7.5 MG tablet Take 1-2 tablets (7.5-15 mg total) by mouth daily. 01/04/14   [name], MD     ROS: The patient denies fevers, chills, night sweats, unintentional weight loss, chest pain, palpitations, wheezing, dyspnea on exertion, nausea, vomiting, abdominal pain, dysuria, hematuria, melena, numbness, weakness, or tingling.   All other systems have been reviewed and were otherwise negative with the exception of those mentioned in the HPI and as above.    PHYSICAL EXAM: Filed Vitals:   02/12/14 1217  BP: 122/74  Pulse: 91  Temp: 98.1 F (36.7 C)  Resp: 17   Filed Vitals:   02/12/14 1217  Height: 5' 5.5" (1.664 m)  Weight: 174 lb (78.926 kg)   Body mass index is 28.5 kg/(m^2).  General: Alert, no acute distress HEENT:  Normocephalic, atraumatic, oropharynx patent. EOMI, PERRLA Cardiovascular:  Regular rate and rhythm, no rubs murmurs or gallops.  No Carotid bruits, radial pulse intact. No pedal edema.  Respiratory: Clear to auscultation bilaterally.  No wheezes, rales, or rhonchi.  No cyanosis, no use of accessory musculature GI: No organomegaly, abdomen is soft and non-tender, positive bowel sounds.  No masses. Skin: No rashes. Neurologic: Facial musculature symmetric. Psychiatric: Patient is appropriate throughout our interaction. Lymphatic: No cervical lymphadenopathy Musculoskeletal: Gait intact.   LABS: Results for orders placed or performed in visit on 01/04/14  COMPLETE METABOLIC PANEL WITH GFR  Result Value Ref Range   Sodium 135 135 - 145 mEq/L   Potassium 4.2 3.5 - 5.3 mEq/L   Chloride 93 (L) 96 - 112 mEq/L   CO2 30 19 - 32 mEq/L   Glucose, Bld 88 70 - 99 mg/dL   BUN 15 6 - 23 mg/dL   Creat 1.03 0.50 - 1.35 mg/dL   Total Bilirubin 0.4 0.2 - 1.2 mg/dL   Alkaline Phosphatase 95 39 - 117 U/L   AST 48 (H) 0 - 37 U/L   ALT 28 0 - 53 U/L   Total Protein 6.6 6.0 - 8.3  g/dL   Albumin 3.3 (L) 3.5 - 5.2 g/dL   Calcium 8.7 8.4 - 10.5 mg/dL   GFR, Est African American >89 mL/min   GFR, Est Non African American 80 mL/min  POCT CBC  Result Value Ref Range   WBC 8.4 4.6 - 10.2 K/uL   Lymph, poc 1.7 0.6 - 3.4  POC LYMPH PERCENT 20.3 10 - 50 %L   MID (cbc) 0.5 0 - 0.9   POC MID % 5.6 0 - 12 %M   POC Granulocyte 6.2 2 - 6.9   Granulocyte percent 74.1 37 - 80 %G   RBC 3.59 (A) 4.69 - 6.13 M/uL   Hemoglobin 9.4 (A) 14.1 - 18.1 g/dL   HCT, POC 32.6 (A) 43.5 - 53.7 %   MCV 90.6 80 - 97 fL   MCH, POC 26.2 (A) 27 - 31.2 pg   MCHC 28.9 (A) 31.8 - 35.4 g/dL   RDW, POC 18.3 %   Platelet Count, POC 653 (A) 142 - 424 K/uL   MPV 6.8 0 - 99.8 fL     EKG/XRAY:   Primary read interpreted by [facility]. Marin [facility] at [facility].   ASSESSMENT/PLAN: Encounter Diagnoses  Name Primary?  . Crohn's colitis, other complication Yes  . Pain in joint, ankle and foot, unspecified laterality    Forms filled out, faxed and a copy was put in scan pile for [place] F/u prn   Gross sideeffects, risk and benefits, and alternatives of medications d/w patient. Patient is aware that all medications have potential sideeffects and we are unable to predict every sideeffect or drug-drug interaction that may occur.  , [place], DO 02/12/2014 1:04 PM

## 2014-07-11 ENCOUNTER — Ambulatory Visit (INDEPENDENT_AMBULATORY_CARE_PROVIDER_SITE_OTHER): Payer: BLUE CROSS/BLUE SHIELD | Admitting: Family Medicine

## 2014-07-11 ENCOUNTER — Ambulatory Visit (INDEPENDENT_AMBULATORY_CARE_PROVIDER_SITE_OTHER): Payer: BLUE CROSS/BLUE SHIELD

## 2014-07-11 VITALS — BP 124/90 | HR 73 | Temp 97.6°F | Ht 66.0 in | Wt 182.0 lb

## 2014-07-11 DIAGNOSIS — M542 Cervicalgia: Secondary | ICD-10-CM | POA: Diagnosis not present

## 2014-07-11 DIAGNOSIS — R208 Other disturbances of skin sensation: Secondary | ICD-10-CM

## 2014-07-11 DIAGNOSIS — M5030 Other cervical disc degeneration, unspecified cervical region: Secondary | ICD-10-CM | POA: Diagnosis not present

## 2014-07-11 MED ORDER — CYCLOBENZAPRINE HCL 5 MG PO TABS: ORAL_TABLET | ORAL | Status: DC

## 2014-07-11 NOTE — Patient Instructions (Signed)
You likely have a pinched nerve from arthritis in neck. Massage to muscles of neck as needed.  Flexeril at night if needed (up to every 8 hours, but this causes sedation).  Heat or ice to area as needed and the other treatments and exercises in the neck care manual as tolerated. If not improving in next 4-6 weeks - return for recheck. Return to the clinic or go to the nearest emergency room if any of your symptoms worsen or new symptoms occur.

## 2014-07-11 NOTE — Progress Notes (Signed)
Subjective:    Patient ID: Phillip Glass, male    DOB: [birth year], 59 y.o.   MRN: [id number]  HPI Chief Complaint  Patient presents with  . Neck Pain    x 2 months (off & on)   This chart was scribed for Phillip Ray, MD by Thea Alken, ED Scribe. This patient was seen in room 3 and the patient's care was started at 6:22 PM.  HPI Comments: Phillip Glass is a 59 y.o. male who presents to the [facility] complaining of sudden onset of intermittent left neck pain that began 4 months ago. He has a hx of Chron's colitis and polyarthralgia followed by Dr. Amil Amen with rheumatology. He takes humira canasa, methotrexate for these conditions. Pt reports he was seen by gastroenterologist and was told he had a pinched nerve that was causing back pain. He reports with certain position he has neck pain with tingling that radiates down to left shoulder and stop at upper left chest wall on the skin. Pt does physical therapy once a month and reports he was given different position to sleep in with improvement to neck pain. He denies weakness in extremities. Pt denies taking medications, muscle relaxers or using ice or heat area. 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 Polyarthralgia 01/18/2014  . Colitis 04/08/2012  . Routine general medical examination at a health care facility 03/03/2012  . Multiple nevi 03/03/2012   Past Medical History  Diagnosis Date  . Crohn's colitis    Past Surgical History  Procedure Laterality Date  . Colonoscopy  01/12/2011  . Tonsillectomy     Allergies  Allergen Reactions  . Codeine Other (See Comments)    Pt states he hallucinates   Prior to Admission medications   Medication Sig Start Date End Date Taking? Authorizing Provider  Adalimumab (HUMIRA) 40 MG/0.8ML PSKT Inject into the skin.    Historical Provider, MD  Aloe Vera 500 MG CAPS Take 1  capsule by mouth daily.    Historical Provider, MD  balsalazide (COLAZAL) 750 MG capsule Take 4,500 mg by mouth 2 (two) times daily.    Historical Provider, MD  Bioflavonoid Products (BIOFLEX PO) Take 1 tablet by mouth daily.    Historical Provider, MD  Ferrous Gluconate (IRON) 240 (27 FE) MG TABS Take 1 tablet by mouth daily.    Historical Provider, MD  Garlic 544 MG TABS Take 1 tablet by mouth daily.    Historical Provider, MD  hydrochlorothiazide (HYDRODIURIL) 25 MG tablet Take 1 tablet (25 mg total) by mouth daily. 12/25/13   Roselee Culver, MD  HYDROcodone-acetaminophen (NORCO/VICODIN) 5-325 MG per tablet Take 1-2 tablets by mouth every 6 (six) hours as needed for moderate pain. 01/03/14   [name], MD  meloxicam (MOBIC) 7.5 MG tablet Take 1-2 tablets (7.5-15 mg total) by mouth daily. 01/04/14   [name], MD  mesalamine (CANASA) 1000 MG suppository Place 1,000 mg rectally at bedtime.    Historical Provider, MD  Multiple Vitamins-Minerals (MULTIVITAMIN WITH MINERALS) tablet Take 1 tablet by mouth daily.    Historical Provider, MD  omeprazole (PRILOSEC) 20 MG capsule Take 1 capsule (20 mg total) by mouth daily. 12/28/13   [name], MD  predniSONE (DELTASONE) 20 MG tablet Take 20 mg by mouth 2 (two) times daily.    Historical Provider, MD  Specialty Vitamins Products (ONE-A-DAY CHOLESTEROL) TABS Take 1 tablet by mouth daily.    Historical Provider, MD   History   Social History  . Marital Status: Legally Separated    Spouse Name: N/A  . Number of Children: N/A  . Years of Education: N/A   Occupational History  . Not on file.   Social History Main Topics  . Smoking status: Never Smoker   . Smokeless tobacco: Never Used  . Alcohol Use: No  . Drug Use: No  . Sexual Activity: No   Other Topics Concern  . Not on file   Social History Narrative   Marital status: divorced in 2013.  Not dating.  Married x 20 years.      Children:  1 child (22); no grandchild.       Lives: with son.      Employment: Scientist, clinical (histocompatibility and immunogenetics) x 35 years; happy.      Tobacco; none      Alcohol: none      Drugs; none      Exercise:  6 days per week for 45 minutes.  Cardio twice weekly; weightlifting 5 days per week.      Seatbelt: 100%      Guns:  Locked and loaded.   Review of Systems  Musculoskeletal: Positive for neck pain.  Neurological: Negative for weakness.   Objective:   Physical Exam  Constitutional: He is oriented to person, place, and time. He appears well-developed and well-nourished. No distress.  HENT:  Head: Normocephalic and atraumatic.  Eyes: Conjunctivae and EOM are normal.  Neck: Neck supple.  Cardiovascular: Normal rate and regular rhythm.  Exam reveals no gallop and no friction rub.   No murmur heard. Pulmonary/Chest: Effort normal and breath sounds normal.  Musculoskeletal: Normal range of motion.  cspine- lacks approximately 20-30 degrees of extension. Lacks approximately  15 degrees right rotation. Lacks approximately 15 degrees left rotation . Lack approximately 10 degrees left lateral flexion. Right lateral flexion intact. No bony tenderness. No appreciable muscle spasms Grip strength equal bilaterally. Equal strength in shoulder bilaterally.   Neurological: He is alert and oriented to person, place, and time.  Reflex Scores:      Tricep reflexes are 2+ on the right side and 1+ on the left side.      Bicep reflexes are 2+ on the right side and 2+ on the left side.      Brachioradialis reflexes are 2+ on the right side and 2+ on the left side. Skin: Skin is warm and dry.  Psychiatric: He has a normal mood and affect. His behavior is normal.  Nursing note and vitals reviewed.  Filed Vitals:   07/11/14 1749  BP: 124/90  Pulse: 73  Temp: 97.6 F (36.4 C)  TempSrc: Oral  Height: 5' 6"  (1.676 m)  Weight: 182 lb (82.555 kg)  SpO2: 98%   [facility] reading (PRIMARY) by Dr. Carlota Raspberry.   Assessment & Plan:   Phillip Glass is a 59 y.o.  male Neck pain on left side - Plan: DG Cervical Spine Complete, cyclobenzaprine (FLEXERIL) 5 MG tablet  Dysesthesia - Plan: DG Cervical Spine Complete, cyclobenzaprine (FLEXERIL) 5 MG tablet  DDD (degenerative disc disease), cervical - Plan: cyclobenzaprine (FLEXERIL) 5 MG tablet   -suspected DDD cervical spine with secondary neuronal impingement - C5 by distribution of dysesthesias.   -HEP, stretches by neck care manual, flexeril as needed - SED. Deferred nsaid or prednisone for now with other chronic meds for colitis. If  persistent, consider further imaging or ortho/spine eval. Sooner if worse.   Meds ordered this encounter  Medications  . FOLIC ACID PO    Sig: Take by mouth daily.  . methotrexate (RHEUMATREX) 10 MG tablet    Sig: Take 100 mg by mouth once a week. Caution: Chemotherapy. Protect from light.  . cyclobenzaprine (FLEXERIL) 5 MG tablet    Sig: 1 pill by mouth up to every 8 hours as needed. Start with one pill by mouth each bedtime as needed due to sedation    Dispense:  15 tablet    Refill:  0   Patient Instructions  You likely have a pinched nerve from arthritis in neck. Massage to muscles of neck as needed.  Flexeril at night if needed (up to every 8 hours, but this causes sedation).  Heat or ice to area as needed and the other treatments and exercises in the neck care manual as tolerated. If not improving in next 4-6 weeks - return for recheck. Return to the clinic or go to the nearest emergency room if any of your symptoms worsen or new symptoms occur.

## 2015-01-18 ENCOUNTER — Ambulatory Visit (INDEPENDENT_AMBULATORY_CARE_PROVIDER_SITE_OTHER): Payer: BLUE CROSS/BLUE SHIELD

## 2015-01-18 DIAGNOSIS — Z23 Encounter for immunization: Secondary | ICD-10-CM | POA: Diagnosis not present

## 2015-04-17 ENCOUNTER — Telehealth: Payer: Self-pay

## 2015-04-17 DIAGNOSIS — M2550 Pain in unspecified joint: Secondary | ICD-10-CM

## 2015-04-17 DIAGNOSIS — K50919 Crohn's disease, unspecified, with unspecified complications: Secondary | ICD-10-CM

## 2015-04-17 NOTE — Telephone Encounter (Signed)
Dr Carlota Raspberry has already spoken to [facility]. Please refer to Dr Amil Amen or Trudie Reed or Charlestine Night. I have put in the referral

## 2015-04-17 NOTE — Telephone Encounter (Signed)
Plan for follow up with new gastroenterologist next week, but discussed need for MTX monitoring by rheum as not interested in going to tertiary care center at this time. Plans on discussing further with GI next week.

## 2015-04-17 NOTE — Telephone Encounter (Signed)
Spoke to [employer]. The provider at [facility] who is taking over pt's care is not comfortable with continuing to prescribe pt's methotrexate. The provider who used to prescribe it retired. Phillip Glass is inquiring who in [place] may continue to prescribe pt's methotrexate. Pt refuses to go to [facility] or [place].  Phillip Glass has suggested [facility] if pt is on methotrexate for his polyarthraliga. Any other suggestions? It looks like Dr. Amil Amen at [facility] has been following pt.  Need to call [place] back at [phone]

## 2015-04-17 NOTE — Telephone Encounter (Signed)
Crystal H from [facility] would like to talk with someone about this patient [phone]

## 2015-07-15 ENCOUNTER — Ambulatory Visit (INDEPENDENT_AMBULATORY_CARE_PROVIDER_SITE_OTHER): Payer: BLUE CROSS/BLUE SHIELD | Admitting: Family Medicine

## 2015-07-15 ENCOUNTER — Ambulatory Visit (INDEPENDENT_AMBULATORY_CARE_PROVIDER_SITE_OTHER): Payer: BLUE CROSS/BLUE SHIELD

## 2015-07-15 VITALS — HR 85 | Temp 99.1°F | Resp 17 | Ht 67.0 in | Wt 188.0 lb

## 2015-07-15 DIAGNOSIS — R059 Cough, unspecified: Secondary | ICD-10-CM

## 2015-07-15 DIAGNOSIS — J3489 Other specified disorders of nose and nasal sinuses: Secondary | ICD-10-CM

## 2015-07-15 DIAGNOSIS — J22 Unspecified acute lower respiratory infection: Secondary | ICD-10-CM

## 2015-07-15 DIAGNOSIS — R05 Cough: Secondary | ICD-10-CM

## 2015-07-15 DIAGNOSIS — J988 Other specified respiratory disorders: Secondary | ICD-10-CM | POA: Diagnosis not present

## 2015-07-15 DIAGNOSIS — J309 Allergic rhinitis, unspecified: Secondary | ICD-10-CM

## 2015-07-15 MED ORDER — AZITHROMYCIN 250 MG PO TABS: ORAL_TABLET | ORAL | Status: DC

## 2015-07-15 MED ORDER — FLUTICASONE PROPIONATE 50 MCG/ACT NA SUSP: 2.0000 | Freq: Every day | NASAL | Status: DC

## 2015-07-15 NOTE — Patient Instructions (Addendum)
     IF you received an x-ray today, you will receive an invoice from [facility]. Please contact [facility] at [phone] with questions or concerns regarding your invoice.   IF you received labwork today, you will receive an invoice from [employer]. Please contact [facility] at [phone] with questions or concerns regarding your invoice.   Our billing staff will not be able to assist you with questions regarding bills from these companies.  You will be contacted with the lab results as soon as they are available. The fastest way to get your results is to activate your My Chart account. Instructions are located on the last page of this paperwork. If you have not heard from [place] regarding the results in 2 weeks, please contact this office.     For allergies, start Flonase nasal spray, 2 sprays per nostril once per day. Allegra or Zyrtec 1 pill per day. Both of these are over-the-counter. You can also use saline or salt water nasal spray 3-4 times per day when not using Flonase to help with the congestion.  Start azithromycin for possible early lower respiratory tract infection or less likely pneumonia. The radiologist did not see a pneumonia today. Recheck in the next 3-4 days if any fevers, or not improving.  Return to the clinic or go to the nearest emergency room if any of your symptoms worsen or new symptoms occur.

## 2015-07-15 NOTE — Progress Notes (Signed)
Subjective:  By signing my name below, I, Moises Blood, attest that this documentation has been prepared under the direction and in the presence of [name], MD. Electronically Signed: Moises Blood, [place]. 07/15/2015 , 11:52 AM .  Patient was seen in Room 6 .   Patient ID: Phillip Glass, male    DOB: February 09, 1956, 60 y.o.   MRN: [id number] Chief Complaint  Patient presents with  . Sinusitis  . Sore Throat   HPI Phillip Glass is a 60 y.o. male Patient here for sinus pressure and sore throat that started 3 days ago. He's also noticed watery eyes and blowing out yellow mucus in the morning. He hasn't had sinus infections for 3-4 years. He states symptoms have worsened today with shortness of breath, wheezing, hot&cold spells, headache and head pressure. He's taken sudafed without relief. He denies any fever.   Patient Active Problem List   Diagnosis Date Noted  . Polyarthralgia 01/18/2014  . Colitis 04/08/2012  . Routine general medical examination at a health care facility 03/03/2012  . Multiple nevi 03/03/2012   Past Medical History  Diagnosis Date  . Crohn's colitis [facility])    Past Surgical History  Procedure Laterality Date  . Colonoscopy  01/12/2011  . Tonsillectomy     Allergies  Allergen Reactions  . Codeine Other (See Comments)    Pt states he hallucinates   Prior to Admission medications   Medication Sig Start Date End Date Taking? Authorizing Provider  Adalimumab (HUMIRA PEN) 40 MG/0.8ML PNKT Inject 40 mg into the skin. 05/22/15  Yes Historical Provider, MD  Aloe Vera 500 MG CAPS Take 1 capsule by mouth daily.   Yes Historical Provider, MD  balsalazide (COLAZAL) 750 MG capsule Take 4,500 mg by mouth 2 (two) times daily.   Yes Historical Provider, MD  Bioflavonoid Products (BIOFLEX PO) Take 1 tablet by mouth daily.   Yes Historical Provider, MD  cyclobenzaprine (FLEXERIL) 5 MG tablet 1 pill by mouth up to every 8 hours as needed. Start with one pill by  mouth each bedtime as needed due to sedation 07/11/14  Yes [name], MD  Ferrous Gluconate (IRON) 240 (27 FE) MG TABS Take 1 tablet by mouth daily.   Yes Historical Provider, MD  FOLIC ACID PO Take by mouth daily.   Yes Historical Provider, MD  Garlic 242 MG TABS Take 1 tablet by mouth daily.   Yes Historical Provider, MD  mesalamine (CANASA) 1000 MG suppository Place 1,000 mg rectally at bedtime.   Yes Historical Provider, MD  methotrexate (RHEUMATREX) 10 MG tablet Take 100 mg by mouth once a week. Caution: Chemotherapy. Protect from light.   Yes Historical Provider, MD  Multiple Vitamins-Minerals (MULTIVITAMIN WITH MINERALS) tablet Take 1 tablet by mouth daily.   Yes Historical Provider, MD  Specialty Vitamins Products (ONE-A-DAY CHOLESTEROL) TABS Take 1 tablet by mouth daily.   Yes Historical Provider, MD   Social History   Social History  . Marital Status: Legally Separated    Spouse Name: N/A  . Number of Children: N/A  . Years of Education: N/A   Occupational History  . Not on file.   Social History Main Topics  . Smoking status: Never Smoker   . Smokeless tobacco: Never Used  . Alcohol Use: No  . Drug Use: No  . Sexual Activity: No   Other Topics Concern  . Not on file   Social History Narrative   Marital status: divorced in 2013.  Not  dating.  Married x 20 years.      Children:  1 child (22); no grandchild.      Lives: with son.      Employment: Scientist, clinical (histocompatibility and immunogenetics) x 35 years; happy.      Tobacco; none      Alcohol: none      Drugs; none      Exercise:  6 days per week for 45 minutes.  Cardio twice weekly; weightlifting 5 days per week.      Seatbelt: 100%      Guns:  Locked and loaded.   Review of Systems  Constitutional: Positive for chills. Negative for fever and fatigue.  HENT: Positive for congestion, sinus pressure and sore throat.   Eyes: Positive for itching.  Respiratory: Positive for shortness of breath and wheezing. Negative for cough.     Gastrointestinal: Negative for nausea and vomiting.  Allergic/Immunologic: Positive for environmental allergies.  Neurological: Positive for headaches.       Objective:   Physical Exam  Constitutional: He is oriented to person, place, and time. He appears well-developed and well-nourished.  HENT:  Head: Normocephalic and atraumatic.  Right Ear: Tympanic membrane, external ear and ear canal normal.  Left Ear: Tympanic membrane, external ear and ear canal normal.  Nose: No rhinorrhea. Right sinus exhibits no maxillary sinus tenderness and no frontal sinus tenderness. Left sinus exhibits no maxillary sinus tenderness and no frontal sinus tenderness.  Mouth/Throat: Oropharynx is clear and moist and mucous membranes are normal. No oropharyngeal exudate or posterior oropharyngeal erythema.  Eyes: Lids are normal. Pupils are equal, round, and reactive to light. Right eye exhibits no exudate. Left eye exhibits no exudate. Right conjunctiva is injected. Left conjunctiva is injected.  Minimal sclera injection, canthi clear  Neck: Neck supple.  Cardiovascular: Normal rate, regular rhythm, normal heart sounds and intact distal pulses.   No murmur heard. Pulmonary/Chest: Effort normal. He has no wheezes. He has rhonchi (scattered) in the right lower field. He has no rales.  Abdominal: Soft. There is no tenderness.  Lymphadenopathy:    He has no cervical adenopathy.  Neurological: He is alert and oriented to person, place, and time.  Skin: Skin is warm and dry. No rash noted.  Psychiatric: He has a normal mood and affect. His behavior is normal.  Vitals reviewed.   Filed Vitals:   07/15/15 1034  Pulse: 85  Temp: 99.1 F (37.3 C)  TempSrc: Oral  Resp: 17  Height: 5' 7"  (1.702 m)  Weight: 188 lb (85.276 kg)  SpO2: 98%   Dg Chest 2 View  07/15/2015  CLINICAL DATA:  Cough, right lower lobe bronchi EXAM: CHEST  2 VIEW COMPARISON:  None. FINDINGS: The heart size and mediastinal contours are  within normal limits. Both lungs are clear. The visualized skeletal structures are unremarkable. IMPRESSION: No active cardiopulmonary disease. Electronically Signed   By: Kathreen Devoid   On: 07/15/2015 12:10      Assessment & Plan:   Phillip Glass is a 60 y.o. male Allergic rhinitis, unspecified allergic rhinitis type - Plan: fluticasone (FLONASE) 50 MCG/ACT nasal spray  Cough - Plan: DG Chest 2 View, azithromycin (ZITHROMAX) 250 MG tablet  Sinus pressure  LRTI (lower respiratory tract infection) - Plan: azithromycin (ZITHROMAX) 250 MG tablet  Suspect allergic rhinitis with secondary sinus pressure. Recent subjective chills/low-grade temp. Few rhonchi right lower lobe. Will treat for possible early LRTI/CAP as he is on immunosuppressives with his other medical problems.   -Azithromycin, Z-Pak #  1  -Tablet-Flonase nasal spray 2 sprays per nostril daily, Zyrtec or Allegra over-the-counter daily.  -rtc precautions.     Meds ordered this encounter           . fluticasone (FLONASE) 50 MCG/ACT nasal spray    Sig: Place 2 sprays into both nostrils daily.    Dispense:  16 g    Refill:  6  . azithromycin (ZITHROMAX) 250 MG tablet    Sig: Take 2 pills by mouth on day 1, then 1 pill by mouth per day on days 2 through 5.    Dispense:  6 tablet    Refill:  0   Patient Instructions       IF you received an x-ray today, you will receive an invoice from [facility]. Please contact [facility] at [phone] with questions or concerns regarding your invoice.   IF you received labwork today, you will receive an invoice from [employer]. Please contact [facility] at [phone] with questions or concerns regarding your invoice.   Our billing staff will not be able to assist you with questions regarding bills from these companies.  You will be contacted with the lab results as soon as they are available. The fastest way to get your results is to  activate your My Chart account. Instructions are located on the last page of this paperwork. If you have not heard from [place] regarding the results in 2 weeks, please contact this office.     For allergies, start Flonase nasal spray, 2 sprays per nostril once per day. Allegra or Zyrtec 1 pill per day. Both of these are over-the-counter. You can also use saline or salt water nasal spray 3-4 times per day when not using Flonase to help with the congestion.  Start azithromycin for possible early lower respiratory tract infection or less likely pneumonia. The radiologist did not see a pneumonia today. Recheck in the next 3-4 days if any fevers, or not improving.  Return to the clinic or go to the nearest emergency room if any of your symptoms worsen or new symptoms occur.

## 2015-07-18 ENCOUNTER — Telehealth: Payer: Self-pay | Admitting: Family Medicine

## 2015-07-18 ENCOUNTER — Ambulatory Visit: Payer: BLUE CROSS/BLUE SHIELD

## 2015-07-18 NOTE — Telephone Encounter (Signed)
Call back to check status. Has some low-grade fever earlier, but temp back to normal now. Still some nasal congestion, still some coughing, but no dyspnea. Has been tolerating the azithromycin. OOW past few days,  planning on returning to work tomorrow.  RTC precautions discussed if persistent fever into tomorrow or Saturday, or secondary sickening after initial improvement. Understanding expressed.

## 2015-07-18 NOTE — Patient Instructions (Signed)
     IF you received an x-ray today, you will receive an invoice from [facility]. Please contact [facility] at [phone] with questions or concerns regarding your invoice.   IF you received labwork today, you will receive an invoice from [facility]. Please contact [facility] at [phone] with questions or concerns regarding your invoice.   Our billing staff will not be able to assist you with questions regarding bills from these companies.  You will be contacted with the lab results as soon as they are available. The fastest way to get your results is to activate your My Chart account. Instructions are located on the last page of this paperwork. If you have not heard from us regarding the results in 2 weeks, please contact this office.      

## 2015-07-18 NOTE — Telephone Encounter (Signed)
Patient had to leave prior to being seen today. I called him to check status. Left message.  Would like to reach him again to determine if change in medications needed or if he would need to be seen tomorrow, could possibly fast track to see me after 2 PM. Left message and I will try to reach him again tonight.

## 2015-07-24 DIAGNOSIS — K50818 Crohn's disease of both small and large intestine with other complication: Secondary | ICD-10-CM | POA: Diagnosis not present

## 2015-07-24 DIAGNOSIS — M1990 Unspecified osteoarthritis, unspecified site: Secondary | ICD-10-CM | POA: Diagnosis not present

## 2015-07-31 DIAGNOSIS — M1990 Unspecified osteoarthritis, unspecified site: Secondary | ICD-10-CM | POA: Diagnosis not present

## 2015-09-04 DIAGNOSIS — K5010 Crohn's disease of large intestine without complications: Secondary | ICD-10-CM | POA: Diagnosis not present

## 2015-09-05 DIAGNOSIS — K5010 Crohn's disease of large intestine without complications: Secondary | ICD-10-CM | POA: Diagnosis not present

## 2015-09-12 DIAGNOSIS — M1990 Unspecified osteoarthritis, unspecified site: Secondary | ICD-10-CM | POA: Diagnosis not present

## 2015-09-12 DIAGNOSIS — K5010 Crohn's disease of large intestine without complications: Secondary | ICD-10-CM | POA: Diagnosis not present

## 2015-09-12 DIAGNOSIS — K5090 Crohn's disease, unspecified, without complications: Secondary | ICD-10-CM | POA: Diagnosis not present

## 2015-09-12 DIAGNOSIS — M0760 Enteropathic arthropathies, unspecified site: Secondary | ICD-10-CM | POA: Diagnosis not present

## 2015-10-09 DIAGNOSIS — H5203 Hypermetropia, bilateral: Secondary | ICD-10-CM | POA: Diagnosis not present

## 2015-10-09 DIAGNOSIS — K5010 Crohn's disease of large intestine without complications: Secondary | ICD-10-CM | POA: Diagnosis not present

## 2015-10-17 DIAGNOSIS — M1990 Unspecified osteoarthritis, unspecified site: Secondary | ICD-10-CM | POA: Diagnosis not present

## 2015-11-14 DIAGNOSIS — M1990 Unspecified osteoarthritis, unspecified site: Secondary | ICD-10-CM | POA: Diagnosis not present

## 2015-12-11 ENCOUNTER — Ambulatory Visit (INDEPENDENT_AMBULATORY_CARE_PROVIDER_SITE_OTHER): Payer: BLUE CROSS/BLUE SHIELD | Admitting: Physician Assistant

## 2015-12-11 ENCOUNTER — Encounter: Payer: Self-pay | Admitting: Physician Assistant

## 2015-12-11 VITALS — BP 122/70 | HR 73 | Temp 98.3°F | Resp 18 | Ht 67.0 in | Wt 187.4 lb

## 2015-12-11 DIAGNOSIS — Z23 Encounter for immunization: Secondary | ICD-10-CM | POA: Diagnosis not present

## 2015-12-11 NOTE — Progress Notes (Signed)
[facility] [address], [place] [address] [phone]  By signing my name below I, Raven Small, attest that this documentation has been prepared under the direction and in the presence of Ivar Drape PA. Electonically Signed. Raven Small, Scribe 12/11/2015 at 4:53 PM  Date:  12/11/2015   Name:  Phillip Glass   DOB:  02/15/1956   MRN:  [id number]  PCP:  Reginia Forts, MD    History of Present Illness:  Phillip Glass is a 60 y.o. male patient who presents to [facility] for immunizations. Pt would like pneumonia vaccine, suggested by GI specialist in [place], [place]. Pt has hx of Chron's disease and on Humira. He is allergic to codeine; no other known allergies.  Patient is deeply concerned of having respiratory illnesses. He has a recent history of a respiratory illness that prevented him from getting his weekly injection.   Patient Active Problem List   Diagnosis Date Noted   Polyarthralgia 01/18/2014   Colitis 04/08/2012   Routine general medical examination at a health care facility 03/03/2012   Multiple nevi 03/03/2012    Past Medical History:  Diagnosis Date   Crohn's colitis [facility])     Past Surgical History:  Procedure Laterality Date   COLONOSCOPY  01/12/2011   TONSILLECTOMY      Social History  Substance Use Topics   Smoking status: Never Smoker   Smokeless tobacco: Never Used   Alcohol use No    Family History  Problem Relation Age of Onset   Hypertension Father    Heart disease Father 96    AMI age 77; second  AMI age 66 cause of death   Diabetes Sister    Dementia Mother    Hypertension Mother     Allergies  Allergen Reactions   Codeine Other (See Comments)    Pt states he hallucinates    Medication list has been reviewed and updated.  Current Outpatient Prescriptions on File Prior to Visit  Medication Sig Dispense Refill   Adalimumab (HUMIRA PEN) 40 MG/0.8ML PNKT Inject 40 mg into the skin.      Aloe Vera 500 MG CAPS Take 1 capsule by mouth daily.     balsalazide (COLAZAL) 750 MG capsule Take 4,500 mg by mouth 2 (two) times daily.     Bioflavonoid Products (BIOFLEX PO) Take 1 tablet by mouth daily.     cyclobenzaprine (FLEXERIL) 5 MG tablet 1 pill by mouth up to every 8 hours as needed. Start with one pill by mouth each bedtime as needed due to sedation 15 tablet 0   Ferrous Gluconate (IRON) 240 (27 FE) MG TABS Take 1 tablet by mouth daily.     fluticasone (FLONASE) 50 MCG/ACT nasal spray Place 2 sprays into both nostrils daily. 16 g 6   FOLIC ACID PO Take by mouth daily.     Garlic 850 MG TABS Take 1 tablet by mouth daily.     mesalamine (CANASA) 1000 MG suppository Place 1,000 mg rectally at bedtime.     methotrexate (RHEUMATREX) 10 MG tablet Take 100 mg by mouth once a week. Caution: Chemotherapy. Protect from light.     Multiple Vitamins-Minerals (MULTIVITAMIN WITH MINERALS) tablet Take 1 tablet by mouth daily.     Specialty Vitamins Products (ONE-A-DAY CHOLESTEROL) TABS Take 1 tablet by mouth daily.     No current facility-administered medications on file prior to visit.     ROS ROS unremarkable unless otherwise specified.  Physical Examination:  BP 122/70    Pulse 73    Temp 98.3 F (36.8 C) (Oral)    Resp 18    Ht 5' 7"  (1.702 m)    Wt 187 lb 6.4 oz (85 kg)    SpO2 97%    BMI 29.35 kg/m  Ideal Body Weight: @FLOWAMB ([id number])@  Physical Exam  Constitutional: He is oriented to person, place, and time. He appears well-developed and well-nourished. No distress.  HENT:  Head: Normocephalic and atraumatic.  Eyes: Conjunctivae and EOM are normal. Pupils are equal, round, and reactive to light.  Cardiovascular: Normal rate.   Pulmonary/Chest: Effort normal and breath sounds normal. No respiratory distress. He has no wheezes.  Neurological: He is alert and oriented to person, place, and time.  Skin: Skin is warm and dry. He is not diaphoretic.  Psychiatric: He has  a normal mood and affect. His behavior is normal.     Assessment and Plan: [name] is a 60 y.o. male who is here today for pneumonia shot. Patient given the Pneumovax today. Within more recent guidelines, a monoclonal antibody or immune no compromising medication does not warrant this pneumococcal vaccine, however this may be effective for him to continue taking his injections regularly without gaps due to respiratory illness. Need for prophylactic vaccination against Streptococcus pneumoniae (pneumococcus) - Plan: Pneumococcal polysaccharide vaccine 23-valent greater than or equal to 2yo subcutaneous/IM  Ivar Drape, PA-C [facility] 12/11/2015 4:53 PM

## 2015-12-11 NOTE — Patient Instructions (Addendum)
Pneumococcal Vaccine, Polyvalent solution for injection What is this medicine? PNEUMOCOCCAL VACCINE, POLYVALENT (NEU mo KOK al vak SEEN, pol ee VEY luhnt) is a vaccine to prevent pneumococcus bacteria infection. These bacteria are a major cause of ear infections, Strep throat infections, and serious pneumonia, meningitis, or blood infections worldwide. These vaccines help the body to produce antibodies (protective substances) that help your body defend against these bacteria. This vaccine is recommended for people 11 years of age and older with health problems. It is also recommended for all adults over 21 years old. This vaccine will not treat an infection. This medicine may be used for other purposes; ask your health care provider or pharmacist if you have questions. What should I tell my health care provider before I take this medicine? They need to know if you have any of these conditions: -bleeding problems -bone marrow or organ transplant -cancer, Hodgkin's disease -fever -infection -immune system problems -low platelet count in the blood -seizures -an unusual or allergic reaction to pneumococcal vaccine, diphtheria toxoid, other vaccines, latex, other medicines, foods, dyes, or preservatives -pregnant or trying to get pregnant -breast-feeding How should I use this medicine? This vaccine is for injection into a muscle or under the skin. It is given by a health care professional. A copy of Vaccine Information Statements will be given before each vaccination. Read this sheet carefully each time. The sheet may change frequently. Talk to your pediatrician regarding the use of this medicine in children. While this drug may be prescribed for children as young as 71 years of age for selected conditions, precautions do apply. Overdosage: If you think you have taken too much of this medicine contact a poison control center or emergency room at once. NOTE: This medicine is only for you. Do not  share this medicine with others. What if I miss a dose? It is important not to miss your dose. Call your doctor or health care professional if you are unable to keep an appointment. What may interact with this medicine? -medicines for cancer chemotherapy -medicines that suppress your immune function -medicines that treat or prevent blood clots like warfarin, enoxaparin, and dalteparin -steroid medicines like prednisone or cortisone This list may not describe all possible interactions. Give your health care provider a list of all the medicines, herbs, non-prescription drugs, or dietary supplements you use. Also tell them if you smoke, drink alcohol, or use illegal drugs. Some items may interact with your medicine. What should I watch for while using this medicine? Mild fever and pain should go away in 3 days or less. Report any unusual symptoms to your doctor or health care professional. What side effects may I notice from receiving this medicine? Side effects that you should report to your doctor or health care professional as soon as possible: -allergic reactions like skin rash, itching or hives, swelling of the face, lips, or tongue -breathing problems -confused -fever over 102 degrees F -pain, tingling, numbness in the hands or feet -seizures -unusual bleeding or bruising -unusual muscle weakness Side effects that usually do not require medical attention (report to your doctor or health care professional if they continue or are bothersome): -aches and pains -diarrhea -fever of 102 degrees F or less -headache -irritable -loss of appetite -pain, tender at site where injected -trouble sleeping This list may not describe all possible side effects. Call your doctor for medical advice about side effects. You may report side effects to FDA at 1-800-FDA-1088. Where should I keep my medicine? This  does not apply. This vaccine is given in a clinic, pharmacy, doctor's office, or other health  care setting and will not be stored at home. NOTE: This sheet is a summary. It may not cover all possible information. If you have questions about this medicine, talk to your doctor, pharmacist, or health care provider.    2016, Elsevier/Gold Standard. (2007-10-21 14:32:37)     IF you received an x-ray today, you will receive an invoice from [facility]. Please contact [facility] at [phone] with questions or concerns regarding your invoice.   IF you received labwork today, you will receive an invoice from [employer]. Please contact [facility] at [phone] with questions or concerns regarding your invoice.   Our billing staff will not be able to assist you with questions regarding bills from these companies.  You will be contacted with the lab results as soon as they are available. The fastest way to get your results is to activate your My Chart account. Instructions are located on the last page of this paperwork. If you have not heard from [place] regarding the results in 2 weeks, please contact this office.

## 2015-12-12 DIAGNOSIS — K5010 Crohn's disease of large intestine without complications: Secondary | ICD-10-CM | POA: Diagnosis not present

## 2015-12-17 DIAGNOSIS — M1990 Unspecified osteoarthritis, unspecified site: Secondary | ICD-10-CM | POA: Diagnosis not present

## 2015-12-17 DIAGNOSIS — K50818 Crohn's disease of both small and large intestine with other complication: Secondary | ICD-10-CM | POA: Diagnosis not present

## 2016-01-29 ENCOUNTER — Ambulatory Visit (INDEPENDENT_AMBULATORY_CARE_PROVIDER_SITE_OTHER): Payer: BLUE CROSS/BLUE SHIELD | Admitting: Family Medicine

## 2016-01-29 VITALS — BP 170/74 | HR 81 | Temp 98.0°F | Resp 16 | Ht 66.5 in | Wt 188.2 lb

## 2016-01-29 DIAGNOSIS — Z23 Encounter for immunization: Secondary | ICD-10-CM | POA: Diagnosis not present

## 2016-01-29 DIAGNOSIS — R21 Rash and other nonspecific skin eruption: Secondary | ICD-10-CM | POA: Diagnosis not present

## 2016-01-29 DIAGNOSIS — B029 Zoster without complications: Secondary | ICD-10-CM | POA: Diagnosis not present

## 2016-01-29 MED ORDER — VALACYCLOVIR HCL 1 G PO TABS: 1000.0000 mg | ORAL_TABLET | Freq: Three times a day (TID) | ORAL | 0 refills | Status: DC

## 2016-01-29 NOTE — Patient Instructions (Signed)
Your rash does appear to be possible shingles. Start the Valtrex today. 1 pill 3 times per day for 1 week. Tylenol over-the-counter if needed, call if stronger medicine needed. For rash, keep clean and covered.   Return to the clinic or go to the nearest emergency room if any of your symptoms worsen or new symptoms occur.   Shingles Shingles, which is also known as herpes zoster, is an infection that causes a painful skin rash and fluid-filled blisters. Shingles is not related to genital herpes, which is a sexually transmitted infection.   Shingles only develops in people who:  Have had chickenpox.  Have received the chickenpox vaccine. (This is rare.) CAUSES Shingles is caused by varicella-zoster virus (VZV). This is the same virus that causes chickenpox. After exposure to VZV, the virus stays in the body in an inactive (dormant) state. Shingles develops if the virus reactivates. This can happen many years after the initial exposure to VZV. It is not known what causes this virus to reactivate. RISK FACTORS People who have had chickenpox or received the chickenpox vaccine are at risk for shingles. Infection is more common in people who:  Are older than age 50.  Have a weakened defense (immune) system, such as those with HIV, AIDS, or cancer.  Are taking medicines that weaken the immune system, such as transplant medicines.  Are under great stress. SYMPTOMS Early symptoms of this condition include itching, tingling, and pain in an area on your skin. Pain may be described as burning, stabbing, or throbbing. A few days or weeks after symptoms start, a painful red rash appears, usually on one side of the body in a bandlike or beltlike pattern. The rash eventually turns into fluid-filled blisters that break open, scab over, and dry up in about 2-3 weeks. At any time during the infection, you may also develop:  A fever.  Chills.  A headache.  An upset stomach. DIAGNOSIS This condition  is diagnosed with a skin exam. Sometimes, skin or fluid samples are taken from the blisters before a diagnosis is made. These samples are examined under a microscope or sent to a lab for testing. TREATMENT There is no specific cure for this condition. Your health care provider will probably prescribe medicines to help you manage pain, recover more quickly, and avoid long-term problems. Medicines may include:  Antiviral drugs.  Anti-inflammatory drugs.  Pain medicines. If the area involved is on your face, you may be referred to a specialist, such as an eye doctor (ophthalmologist) or an ear, nose, and throat (ENT) doctor to help you avoid eye problems, chronic pain, or disability. HOME CARE INSTRUCTIONS Medicines  Take medicines only as directed by your health care provider.  Apply an anti-itch or numbing cream to the affected area as directed by your health care provider. Blister and Rash Care  Take a cool bath or apply cool compresses to the area of the rash or blisters as directed by your health care provider. This may help with pain and itching.  Keep your rash covered with a loose bandage (dressing). Wear loose-fitting clothing to help ease the pain of material rubbing against the rash.  Keep your rash and blisters clean with mild soap and cool water or as directed by your health care provider.  Check your rash every day for signs of infection. These include redness, swelling, and pain that lasts or increases.  Do not pick your blisters.  Do not scratch your rash. General Instructions  Rest as directed  by your health care provider.  Keep all follow-up visits as directed by your health care provider. This is important.  Until your blisters scab over, your infection can cause chickenpox in people who have never had it or been vaccinated against it. To prevent this from happening, avoid contact with other people, especially:  Babies.  Pregnant women.  Children who have  eczema.  Elderly people who have transplants.  People who have chronic illnesses, such as leukemia or AIDS. SEEK MEDICAL CARE IF:  Your pain is not relieved with prescribed medicines.  Your pain does not get better after the rash heals.  Your rash looks infected. Signs of infection include redness, swelling, and pain that lasts or increases. SEEK IMMEDIATE MEDICAL CARE IF:  The rash is on your face or nose.  You have facial pain, pain around your eye area, or loss of feeling on one side of your face.  You have ear pain or you have ringing in your ear.  You have loss of taste.  Your condition gets worse.   This information is not intended to replace advice given to you by your health care provider. Make sure you discuss any questions you have with your health care provider.   Document Released: 03/16/2005 Document Revised: 04/06/2014 Document Reviewed: 01/25/2014 Elsevier Interactive Patient Education 2016 [employer].     IF you received an x-ray today, you will receive an invoice from [facility]. Please contact [facility] at [phone] with questions or concerns regarding your invoice.   IF you received labwork today, you will receive an invoice from [facility]. Please contact [facility] at [phone] with questions or concerns regarding your invoice.   Our billing staff will not be able to assist you with questions regarding bills from these companies.  You will be contacted with the lab results as soon as they are available. The fastest way to get your results is to activate your My Chart account. Instructions are located on the last page of this paperwork. If you have not heard from [place] regarding the results in 2 weeks, please contact this office.

## 2016-01-29 NOTE — Progress Notes (Signed)
     IF you received an x-ray today, you will receive an invoice from [facility]. Please contact [facility] at [phone] with questions or concerns regarding your invoice.   IF you received labwork today, you will receive an invoice from [facility]. Please contact [facility] at [phone] with questions or concerns regarding your invoice.   Our billing staff will not be able to assist you with questions regarding bills from these companies.  You will be contacted with the lab results as soon as they are available. The fastest way to get your results is to activate your My Chart account. Instructions are located on the last page of this paperwork. If you have not heard from us regarding the results in 2 weeks, please contact this office.      

## 2016-01-29 NOTE — Progress Notes (Signed)
By signing my name below, I, Mesha Guinyard, attest that this documentation has been prepared under the direction and in the presence of [name], MD.  Electronically Signed: Verlee Monte, Medical Scribe. 01/29/16. 3:08 PM.  Subjective:    Patient ID: Phillip Glass, male    DOB: Aug 31, 1955, 60 y.o.   MRN: [id number]  HPI Chief Complaint  Patient presents with  . Rash    back (2 days)    HPI Comments: Phillip Glass is a 60 y.o. male who presents to the [facility] complaining of rash on back with intermittent itchiness for the past 2 days. States he's never had symptoms similar to this before. Tried an ointment without relief of symptoms. Has not had a shingles vaccine. Pt works 14 hours shifts and states he's worn out from work, working at [employer] at times. . Denies burning from concerned site, soreness from site, rash on any other part of his body, and fever.  Immunizations: Had his flu and PNA shot. Has not received his shingles vaccine. Immunization History  Administered Date(s) Administered  . Influenza,inj,Quad PF,36+ Mos 01/22/2014, 01/18/2015  . Pneumococcal Polysaccharide-23 12/11/2015    Patient Active Problem List   Diagnosis Date Noted  . Polyarthralgia 01/18/2014  . Colitis 04/08/2012  . Routine general medical examination at a health care facility 03/03/2012  . Multiple nevi 03/03/2012   Past Medical History:  Diagnosis Date  . Crohn's colitis [facility])    Past Surgical History:  Procedure Laterality Date  . COLONOSCOPY  01/12/2011  . TONSILLECTOMY     Allergies  Allergen Reactions  . Codeine Other (See Comments)    Pt states he hallucinates   Prior to Admission medications   Medication Sig Start Date End Date Taking? Authorizing Provider  Adalimumab (HUMIRA PEN) 40 MG/0.8ML PNKT Inject 40 mg into the skin. 05/22/15  Yes Historical Provider, MD  Aloe Vera 500 MG CAPS Take 1 capsule by mouth daily.   Yes Historical Provider, MD    balsalazide (COLAZAL) 750 MG capsule Take 4,500 mg by mouth 2 (two) times daily.   Yes Historical Provider, MD  Bioflavonoid Products (BIOFLEX PO) Take 1 tablet by mouth daily.   Yes Historical Provider, MD  cyclobenzaprine (FLEXERIL) 5 MG tablet 1 pill by mouth up to every 8 hours as needed. Start with one pill by mouth each bedtime as needed due to sedation 07/11/14  Yes [name], MD  Ferrous Gluconate (IRON) 240 (27 FE) MG TABS Take 1 tablet by mouth daily.   Yes Historical Provider, MD  fluticasone (FLONASE) 50 MCG/ACT nasal spray Place 2 sprays into both nostrils daily. 07/15/15  Yes [name], MD  FOLIC ACID PO Take by mouth daily.   Yes Historical Provider, MD  Garlic 035 MG TABS Take 1 tablet by mouth daily.   Yes Historical Provider, MD  mesalamine (CANASA) 1000 MG suppository Place 1,000 mg rectally at bedtime.   Yes Historical Provider, MD  methotrexate (RHEUMATREX) 10 MG tablet Take 100 mg by mouth once a week. Caution: Chemotherapy. Protect from light.   Yes Historical Provider, MD  Multiple Vitamins-Minerals (MULTIVITAMIN WITH MINERALS) tablet Take 1 tablet by mouth daily.   Yes Historical Provider, MD  Specialty Vitamins Products (ONE-A-DAY CHOLESTEROL) TABS Take 1 tablet by mouth daily.   Yes Historical Provider, MD   Social History   Social History  . Marital status: Legally Separated    Spouse name: N/A  . Number of children:  N/A  . Years of education: N/A   Occupational History  . Not on file.   Social History Main Topics  . Smoking status: Never Smoker  . Smokeless tobacco: Never Used  . Alcohol use No  . Drug use: No  . Sexual activity: No   Other Topics Concern  . Not on file   Social History Narrative   Marital status: divorced in 2013.  Not dating.  Married x 20 years.      Children:  1 child (22); no grandchild.      Lives: with son.      Employment: Scientist, clinical (histocompatibility and immunogenetics) x 35 years; happy.      Tobacco; none      Alcohol: none      Drugs;  none      Exercise:  6 days per week for 45 minutes.  Cardio twice weekly; weightlifting 5 days per week.      Seatbelt: 100%      Guns:  Locked and loaded.   Review of Systems  Constitutional: Negative for fever.  Skin: Positive for color change and rash.       Pos itchiness Neg burning sensation   Objective:  Physical Exam  Constitutional: He appears well-developed and well-nourished. No distress.  HENT:  Head: Normocephalic and atraumatic.  Eyes: Conjunctivae are normal.  Neck: Neck supple.  Cardiovascular: Normal rate.   Pulmonary/Chest: Effort normal.  Neurological: He is alert.  Skin: Skin is warm and dry. Rash noted. There is erythema.  Patch of erythema on right flank with small superimposed vesicles; remainder of the back appears to be nl No left sided rash No abdominal rash  Psychiatric: He has a normal mood and affect. His behavior is normal.  Nursing note and vitals reviewed.  BP (!) 170/74 (BP Location: Right Arm, Patient Position: Sitting, Cuff Size: Normal)   Pulse 81   Temp 98 F (36.7 C) (Oral)   Resp 16   Ht 5' 6.5" (1.689 m)   Wt 188 lb 3.2 oz (85.4 kg)   SpO2 97%   BMI 29.92 kg/m  Assessment & Plan:  Viral culture obtained, but minimal sample available.  Phillip Glass is a 60 y.o. male Immunization due - Plan: Flu Vaccine QUAD 36+ mos IM   Rash of back - Plan: Herpes simplex virus culture, valACYclovir (VALTREX) 1000 MG tablet Herpes zoster without complication - Plan: Herpes simplex virus culture, valACYclovir (VALTREX) 1000 MG tablet  - Small patch, but concerning for shingles. Increased work hours and stress may be a contributor as well as Humira use.  -Sample obtained, but may not be beneficial as no apparent fluid from patch.  -Start Valtrex 1 g 3 times a day, Tylenol for pain, call if stronger medication needed. rtc precautions.   Meds ordered this encounter  Medications  . valACYclovir (VALTREX) 1000 MG tablet    Sig: Take 1 tablet  (1,000 mg total) by mouth 3 (three) times daily.    Dispense:  21 tablet    Refill:  0   Patient Instructions   Your rash does appear to be possible shingles. Start the Valtrex today. 1 pill 3 times per day for 1 week. Tylenol over-the-counter if needed, call if stronger medicine needed. For rash, keep clean and covered.   Return to the clinic or go to the nearest emergency room if any of your symptoms worsen or new symptoms occur.   Shingles Shingles, which is also known as herpes zoster, is an infection  that causes a painful skin rash and fluid-filled blisters. Shingles is not related to genital herpes, which is a sexually transmitted infection.   Shingles only develops in people who:  Have had chickenpox.  Have received the chickenpox vaccine. (This is rare.) CAUSES Shingles is caused by varicella-zoster virus (VZV). This is the same virus that causes chickenpox. After exposure to VZV, the virus stays in the body in an inactive (dormant) state. Shingles develops if the virus reactivates. This can happen many years after the initial exposure to VZV. It is not known what causes this virus to reactivate. RISK FACTORS People who have had chickenpox or received the chickenpox vaccine are at risk for shingles. Infection is more common in people who:  Are older than age 58.  Have a weakened defense (immune) system, such as those with HIV, AIDS, or cancer.  Are taking medicines that weaken the immune system, such as transplant medicines.  Are under great stress. SYMPTOMS Early symptoms of this condition include itching, tingling, and pain in an area on your skin. Pain may be described as burning, stabbing, or throbbing. A few days or weeks after symptoms start, a painful red rash appears, usually on one side of the body in a bandlike or beltlike pattern. The rash eventually turns into fluid-filled blisters that break open, scab over, and dry up in about 2-3 weeks. At any time during the  infection, you may also develop:  A fever.  Chills.  A headache.  An upset stomach. DIAGNOSIS This condition is diagnosed with a skin exam. Sometimes, skin or fluid samples are taken from the blisters before a diagnosis is made. These samples are examined under a microscope or sent to a lab for testing. TREATMENT There is no specific cure for this condition. Your health care provider will probably prescribe medicines to help you manage pain, recover more quickly, and avoid long-term problems. Medicines may include:  Antiviral drugs.  Anti-inflammatory drugs.  Pain medicines. If the area involved is on your face, you may be referred to a specialist, such as an eye doctor (ophthalmologist) or an ear, nose, and throat (ENT) doctor to help you avoid eye problems, chronic pain, or disability. HOME CARE INSTRUCTIONS Medicines  Take medicines only as directed by your health care provider.  Apply an anti-itch or numbing cream to the affected area as directed by your health care provider. Blister and Rash Care  Take a cool bath or apply cool compresses to the area of the rash or blisters as directed by your health care provider. This may help with pain and itching.  Keep your rash covered with a loose bandage (dressing). Wear loose-fitting clothing to help ease the pain of material rubbing against the rash.  Keep your rash and blisters clean with mild soap and cool water or as directed by your health care provider.  Check your rash every day for signs of infection. These include redness, swelling, and pain that lasts or increases.  Do not pick your blisters.  Do not scratch your rash. General Instructions  Rest as directed by your health care provider.  Keep all follow-up visits as directed by your health care provider. This is important.  Until your blisters scab over, your infection can cause chickenpox in people who have never had it or been vaccinated against it. To prevent  this from happening, avoid contact with other people, especially:  Babies.  Pregnant women.  Children who have eczema.  Elderly people who have transplants.  People  who have chronic illnesses, such as leukemia or AIDS. SEEK MEDICAL CARE IF:  Your pain is not relieved with prescribed medicines.  Your pain does not get better after the rash heals.  Your rash looks infected. Signs of infection include redness, swelling, and pain that lasts or increases. SEEK IMMEDIATE MEDICAL CARE IF:  The rash is on your face or nose.  You have facial pain, pain around your eye area, or loss of feeling on one side of your face.  You have ear pain or you have ringing in your ear.  You have loss of taste.  Your condition gets worse.   This information is not intended to replace advice given to you by your health care provider. Make sure you discuss any questions you have with your health care provider.   Document Released: 03/16/2005 Document Revised: 04/06/2014 Document Reviewed: 01/25/2014 Elsevier Interactive Patient Education 2016 [employer].     IF you received an x-ray today, you will receive an invoice from [facility]. Please contact [facility] at [phone] with questions or concerns regarding your invoice.   IF you received labwork today, you will receive an invoice from [facility]. Please contact [facility] at [phone] with questions or concerns regarding your invoice.   Our billing staff will not be able to assist you with questions regarding bills from these companies.  You will be contacted with the lab results as soon as they are available. The fastest way to get your results is to activate your My Chart account. Instructions are located on the last page of this paperwork. If you have not heard from [place] regarding the results in 2 weeks, please contact this office.        I personally performed the services described in  this documentation, which was scribed in my presence. The recorded information has been reviewed and considered, and addended by me as needed.   Signed,   [name], MD [facility].  01/29/16 3:33 PM

## 2016-02-03 LAB — HERPES CULTURE, RAPID

## 2016-02-07 ENCOUNTER — Telehealth: Payer: Self-pay | Admitting: Emergency Medicine

## 2016-02-07 NOTE — Telephone Encounter (Signed)
-----   Message from [name], MD sent at 02/07/2016 12:46 PM EST ----- Call patient. Check status. Tests for shingles came back negative, but this is not 100% reliable as may not have obtained a significant swab. If rash is improving, no further workup needed. Let me know if there are any questions.

## 2016-03-04 DIAGNOSIS — K5010 Crohn's disease of large intestine without complications: Secondary | ICD-10-CM | POA: Diagnosis not present

## 2016-03-18 DIAGNOSIS — M1990 Unspecified osteoarthritis, unspecified site: Secondary | ICD-10-CM | POA: Diagnosis not present

## 2016-03-18 DIAGNOSIS — K50818 Crohn's disease of both small and large intestine with other complication: Secondary | ICD-10-CM | POA: Diagnosis not present

## 2016-04-01 ENCOUNTER — Telehealth: Payer: Self-pay

## 2016-04-01 ENCOUNTER — Encounter: Payer: Self-pay | Admitting: Family Medicine

## 2016-04-01 ENCOUNTER — Ambulatory Visit (INDEPENDENT_AMBULATORY_CARE_PROVIDER_SITE_OTHER): Payer: BLUE CROSS/BLUE SHIELD | Admitting: Family Medicine

## 2016-04-01 VITALS — BP 130/82 | HR 74 | Temp 98.9°F | Resp 18 | Ht 66.5 in | Wt 187.0 lb

## 2016-04-01 DIAGNOSIS — J069 Acute upper respiratory infection, unspecified: Secondary | ICD-10-CM

## 2016-04-01 MED ORDER — PSEUDOEPH-BROMPHEN-DM 30-2-10 MG/5ML PO SYRP: 5.0000 mL | ORAL_SOLUTION | Freq: Four times a day (QID) | ORAL | 0 refills | Status: DC | PRN

## 2016-04-01 MED ORDER — AMOXICILLIN 875 MG PO TABS: 875.0000 mg | ORAL_TABLET | Freq: Two times a day (BID) | ORAL | 0 refills | Status: DC

## 2016-04-01 MED ORDER — AZITHROMYCIN 1 G PO PACK: 1.0000 g | PACK | Freq: Once | ORAL | 0 refills | Status: DC

## 2016-04-01 NOTE — Patient Instructions (Addendum)
Start Azithromycin Take 2 tabs x 1 dose, then 1 tab every day for x 4 days  Brompheniramine-pseudoephedrine-DM 30-2-10 MG/5ML take 5 ml every 4 hours as needed for cough.   IF you received an x-ray today, you will receive an invoice from [facility]. Please contact [facility] at [phone] with questions or concerns regarding your invoice.   IF you received labwork today, you will receive an invoice from [facility]. Please contact [facility] at [phone] with questions or concerns regarding your invoice.   Our billing staff will not be able to assist you with questions regarding bills from these companies.  You will be contacted with the lab results as soon as they are available. The fastest way to get your results is to activate your My Chart account. Instructions are located on the last page of this paperwork. If you have not heard from [place] regarding the results in 2 weeks, please contact this office.     Upper Respiratory Infection, Adult Most upper respiratory infections (URIs) are caused by a virus. A URI affects the nose, throat, and upper air passages. The most common type of URI is often called "the common cold." Follow these instructions at home:  Take medicines only as told by your doctor.  Gargle warm saltwater or take cough drops to comfort your throat as told by your doctor.  Use a warm mist humidifier or inhale steam from a shower to increase air moisture. This may make it easier to breathe.  Drink enough fluid to keep your pee (urine) clear or pale yellow.  Eat soups and other clear broths.  Have a healthy diet.  Rest as needed.  Go back to work when your fever is gone or your doctor says it is okay.  You may need to stay home longer to avoid giving your URI to others.  You can also wear a face mask and wash your hands often to prevent spread of the virus.  Use your inhaler more if you have asthma.  Do not use any tobacco products, including  cigarettes, chewing tobacco, or electronic cigarettes. If you need help quitting, ask your doctor. Contact a doctor if:  You are getting worse, not better.  Your symptoms are not helped by medicine.  You have chills.  You are getting more short of breath.  You have brown or red mucus.  You have yellow or brown discharge from your nose.  You have pain in your face, especially when you bend forward.  You have a fever.  You have puffy (swollen) neck glands.  You have pain while swallowing.  You have white areas in the back of your throat. Get help right away if:  You have very bad or constant:  Headache.  Ear pain.  Pain in your forehead, behind your eyes, and over your cheekbones (sinus pain).  Chest pain.  You have long-lasting (chronic) lung disease and any of the following:  Wheezing.  Long-lasting cough.  Coughing up blood.  A change in your usual mucus.  You have a stiff neck.  You have changes in your:  Vision.  Hearing.  Thinking.  Mood. This information is not intended to replace advice given to you by your health care provider. Make sure you discuss any questions you have with your health care provider. Document Released: 09/02/2007 Document Revised: 11/17/2015 Document Reviewed: 06/21/2013 Elsevier Interactive Patient Education  2017 [employer].

## 2016-04-01 NOTE — Progress Notes (Signed)
Patient ID: Phillip Glass, male    DOB: 1955-06-30, 61 y.o.   MRN: [id number]  PCP: Reginia Forts, MD  Chief Complaint  Patient presents with  . Sore Throat  . chest cold    Subjective:  HPI  61 year old male presents for evaluation of sore throat and chest cold x 3 days. Pt is currently taking 2 immunosuppressants for Crohn's disease. Reports that he works long hours at [employer]. Last Humira injection was two weeks ago and next injection is Saturday. Cough with sometime productive and  soreness of throat. Really bad coughing spell last nights. Used Vicks body ointment for body aches he experienced yesterday.  Social History   Social History  . Marital status: Legally Separated    Spouse name: N/A  . Number of children: N/A  . Years of education: N/A   Occupational History  . Not on file.   Social History Main Topics  . Smoking status: Never Smoker  . Smokeless tobacco: Never Used  . Alcohol use No  . Drug use: No  . Sexual activity: No   Other Topics Concern  . Not on file   Social History Narrative   Marital status: divorced in 2013.  Not dating.  Married x 20 years.      Children:  1 child (22); no grandchild.      Lives: with son.      Employment: Scientist, clinical (histocompatibility and immunogenetics) x 35 years; happy.      Tobacco; none      Alcohol: none      Drugs; none      Exercise:  6 days per week for 45 minutes.  Cardio twice weekly; weightlifting 5 days per week.      Seatbelt: 100%      Guns:  Locked and loaded.    Family History  Problem Relation Age of Onset  . Hypertension Father   . Heart disease Father 36    AMI age 32; second  AMI age 87 cause of death  . Diabetes Sister   . Dementia Mother   . Hypertension Mother    Review of Systems HPI Patient Active Problem List   Diagnosis Date Noted  . Polyarthralgia 01/18/2014  . Colitis 04/08/2012  . Routine general medical examination at a health care facility 03/03/2012  . Multiple nevi 03/03/2012    Allergies    Allergen Reactions  . Codeine Other (See Comments)    Pt states he hallucinates    Prior to Admission medications   Medication Sig Start Date End Date Taking? Authorizing Provider  Adalimumab (HUMIRA PEN) 40 MG/0.8ML PNKT Inject 40 mg into the skin. 05/22/15  Yes Historical Provider, MD  Aloe Vera 500 MG CAPS Take 1 capsule by mouth daily.   Yes Historical Provider, MD  balsalazide (COLAZAL) 750 MG capsule Take 4,500 mg by mouth 2 (two) times daily.   Yes Historical Provider, MD  Bioflavonoid Products (BIOFLEX PO) Take 1 tablet by mouth daily.   Yes Historical Provider, MD  cyclobenzaprine (FLEXERIL) 5 MG tablet 1 pill by mouth up to every 8 hours as needed. Start with one pill by mouth each bedtime as needed due to sedation 07/11/14  Yes [name], MD  Ferrous Gluconate (IRON) 240 (27 FE) MG TABS Take 1 tablet by mouth daily.   Yes Historical Provider, MD  fluticasone (FLONASE) 50 MCG/ACT nasal spray Place 2 sprays into both nostrils daily. 07/15/15  Yes [name], MD  FOLIC ACID PO Take  by mouth daily.   Yes Historical Provider, MD  Garlic 426 MG TABS Take 1 tablet by mouth daily.   Yes Historical Provider, MD  mesalamine (CANASA) 1000 MG suppository Place 1,000 mg rectally at bedtime.   Yes Historical Provider, MD  methotrexate (RHEUMATREX) 10 MG tablet Take 100 mg by mouth once a week. Caution: Chemotherapy. Protect from light.   Yes Historical Provider, MD  Multiple Vitamins-Minerals (MULTIVITAMIN WITH MINERALS) tablet Take 1 tablet by mouth daily.   Yes Historical Provider, MD  Specialty Vitamins Products (ONE-A-DAY CHOLESTEROL) TABS Take 1 tablet by mouth daily.   Yes Historical Provider, MD  valACYclovir (VALTREX) 1000 MG tablet Take 1 tablet (1,000 mg total) by mouth 3 (three) times daily. 01/29/16  Yes [name], MD    Past Medical, Surgical Family and Social History reviewed and updated.    Objective:   Today's Vitals   04/01/16 1139  BP: 130/82  Pulse: 74   Resp: 18  Temp: 98.9 F (37.2 C)  TempSrc: Oral  SpO2: 97%  Weight: 187 lb (84.8 kg)  Height: 5' 6.5" (1.689 m)    Wt Readings from Last 3 Encounters:  04/01/16 187 lb (84.8 kg)  01/29/16 188 lb 3.2 oz (85.4 kg)  12/11/15 187 lb 6.4 oz (85 kg)    Physical Exam  Constitutional: He is oriented to person, place, and time. He appears well-developed and well-nourished.  HENT:  Head: Normocephalic and atraumatic.  Nose: Mucosal edema and rhinorrhea present.  Mouth/Throat: Uvula is midline and oropharynx is clear and moist.  Neck: Normal range of motion.  Cardiovascular: Normal rate, regular rhythm, normal heart sounds and intact distal pulses.   Pulmonary/Chest: Effort normal and breath sounds normal.  Lymphadenopathy:    He has no cervical adenopathy.  Neurological: He is alert and oriented to person, place, and time.  Skin: Skin is warm.  Psychiatric: He has a normal mood and affect. His behavior is normal. Judgment and thought content normal.      Assessment & Plan:  1. Acute upper respiratory infection Start Azithromycin Take 2 tabs x 1 dose, then 1 tab every day for x 4 days  Brompheniramine-pseudoephedrine-DM 30-2-10 MG/5ML take 5 ml every 4 hours as needed for cough.  Return for follow-up if symptoms do not improve.  Carroll Sage. Kenton Kingfisher, MSN, FNP-C [facility]

## 2016-04-01 NOTE — Telephone Encounter (Signed)
Sent over Augmentin instead of Azithromycin as pharmacy was out of medication.

## 2016-06-03 DIAGNOSIS — K5010 Crohn's disease of large intestine without complications: Secondary | ICD-10-CM | POA: Diagnosis not present

## 2016-06-17 DIAGNOSIS — M1990 Unspecified osteoarthritis, unspecified site: Secondary | ICD-10-CM | POA: Diagnosis not present

## 2016-06-17 DIAGNOSIS — K50818 Crohn's disease of both small and large intestine with other complication: Secondary | ICD-10-CM | POA: Diagnosis not present

## 2016-08-12 DIAGNOSIS — H5203 Hypermetropia, bilateral: Secondary | ICD-10-CM | POA: Diagnosis not present

## 2016-09-09 DIAGNOSIS — K5010 Crohn's disease of large intestine without complications: Secondary | ICD-10-CM | POA: Diagnosis not present

## 2016-09-16 DIAGNOSIS — M1990 Unspecified osteoarthritis, unspecified site: Secondary | ICD-10-CM | POA: Diagnosis not present

## 2016-09-16 DIAGNOSIS — K50818 Crohn's disease of both small and large intestine with other complication: Secondary | ICD-10-CM | POA: Diagnosis not present

## 2016-12-09 DIAGNOSIS — K5010 Crohn's disease of large intestine without complications: Secondary | ICD-10-CM | POA: Diagnosis not present

## 2016-12-29 DIAGNOSIS — H5213 Myopia, bilateral: Secondary | ICD-10-CM | POA: Diagnosis not present

## 2017-01-06 DIAGNOSIS — D643 Other sideroblastic anemias: Secondary | ICD-10-CM | POA: Diagnosis not present

## 2017-01-06 DIAGNOSIS — K529 Noninfective gastroenteritis and colitis, unspecified: Secondary | ICD-10-CM | POA: Diagnosis not present

## 2017-01-08 ENCOUNTER — Ambulatory Visit (INDEPENDENT_AMBULATORY_CARE_PROVIDER_SITE_OTHER): Payer: BLUE CROSS/BLUE SHIELD | Admitting: Family Medicine

## 2017-01-08 DIAGNOSIS — Z23 Encounter for immunization: Secondary | ICD-10-CM | POA: Diagnosis not present

## 2017-02-03 ENCOUNTER — Encounter: Payer: Self-pay | Admitting: Physician Assistant

## 2017-02-03 ENCOUNTER — Ambulatory Visit: Payer: BLUE CROSS/BLUE SHIELD | Admitting: Physician Assistant

## 2017-02-03 VITALS — BP 160/88 | HR 90 | Temp 98.3°F | Resp 18 | Ht 66.5 in | Wt 198.0 lb

## 2017-02-03 DIAGNOSIS — S39012A Strain of muscle, fascia and tendon of lower back, initial encounter: Secondary | ICD-10-CM

## 2017-02-03 MED ORDER — CYCLOBENZAPRINE HCL 10 MG PO TABS: 5.0000 mg | ORAL_TABLET | Freq: Three times a day (TID) | ORAL | 0 refills | Status: DC | PRN

## 2017-02-03 MED ORDER — TRAMADOL HCL 50 MG PO TABS: 50.0000 mg | ORAL_TABLET | Freq: Three times a day (TID) | ORAL | 0 refills | Status: DC | PRN

## 2017-02-03 NOTE — Patient Instructions (Addendum)
Please ice the back three times per day for 15 minutes Please perform stretches.  Pick three pictures and do them three times per day.  You may heat prior to stretches, but ice directly after. Please note, flexeril can cuase sedation.  Avoid operating heavy machinery.  Back Exercises If you have pain in your back, do these exercises 2-3 times each day or as told by your doctor. When the pain goes away, do the exercises once each day, but repeat the steps more times for each exercise (do more repetitions). If you do not have pain in your back, do these exercises once each day or as told by your doctor. Exercises Single Knee to Chest  Do these steps 3-5 times in a row for each leg: 1. Lie on your back on a firm bed or the floor with your legs stretched out. 2. Bring one knee to your chest. 3. Hold your knee to your chest by grabbing your knee or thigh. 4. Pull on your knee until you feel a gentle stretch in your lower back. 5. Keep doing the stretch for 10-30 seconds. 6. Slowly let go of your leg and straighten it.  Pelvic Tilt  Do these steps 5-10 times in a row: 1. Lie on your back on a firm bed or the floor with your legs stretched out. 2. Bend your knees so they point up to the ceiling. Your feet should be flat on the floor. 3. Tighten your lower belly (abdomen) muscles to press your lower back against the floor. This will make your tailbone point up to the ceiling instead of pointing down to your feet or the floor. 4. Stay in this position for 5-10 seconds while you gently tighten your muscles and breathe evenly.  Cat-Cow  Do these steps until your lower back bends more easily: 1. Get on your hands and knees on a firm surface. Keep your hands under your shoulders, and keep your knees under your hips. You may put padding under your knees. 2. Let your head hang down, and make your tailbone point down to the floor so your lower back is round like the back of a cat. 3. Stay in this  position for 5 seconds. 4. Slowly lift your head and make your tailbone point up to the ceiling so your back hangs low (sags) like the back of a cow. 5. Stay in this position for 5 seconds.  Press-Ups  Do these steps 5-10 times in a row: 1. Lie on your belly (face-down) on the floor. 2. Place your hands near your head, about shoulder-width apart. 3. While you keep your back relaxed and keep your hips on the floor, slowly straighten your arms to raise the top half of your body and lift your shoulders. Do not use your back muscles. To make yourself more comfortable, you may change where you place your hands. 4. Stay in this position for 5 seconds. 5. Slowly return to lying flat on the floor.  Bridges  Do these steps 10 times in a row: 1. Lie on your back on a firm surface. 2. Bend your knees so they point up to the ceiling. Your feet should be flat on the floor. 3. Tighten your butt muscles and lift your butt off of the floor until your waist is almost as high as your knees. If you do not feel the muscles working in your butt and the back of your thighs, slide your feet 1-2 inches farther away from your butt.  4. Stay in this position for 3-5 seconds. 5. Slowly lower your butt to the floor, and let your butt muscles relax.  If this exercise is too easy, try doing it with your arms crossed over your chest. Belly Crunches  Do these steps 5-10 times in a row: 1. Lie on your back on a firm bed or the floor with your legs stretched out. 2. Bend your knees so they point up to the ceiling. Your feet should be flat on the floor. 3. Cross your arms over your chest. 4. Tip your chin a little bit toward your chest but do not bend your neck. 5. Tighten your belly muscles and slowly raise your chest just enough to lift your shoulder blades a tiny bit off of the floor. 6. Slowly lower your chest and your head to the floor.  Back Lifts Do these steps 5-10 times in a row: 1. Lie on your belly  (face-down) with your arms at your sides, and rest your forehead on the floor. 2. Tighten the muscles in your legs and your butt. 3. Slowly lift your chest off of the floor while you keep your hips on the floor. Keep the back of your head in line with the curve in your back. Look at the floor while you do this. 4. Stay in this position for 3-5 seconds. 5. Slowly lower your chest and your face to the floor.  Contact a doctor if:  Your back pain gets a lot worse when you do an exercise.  Your back pain does not lessen 2 hours after you exercise. If you have any of these problems, stop doing the exercises. Do not do them again unless your doctor says it is okay. Get help right away if:  You have sudden, very bad back pain. If this happens, stop doing the exercises. Do not do them again unless your doctor says it is okay. This information is not intended to replace advice given to you by your health care provider. Make sure you discuss any questions you have with your health care provider. Document Released: 04/18/2010 Document Revised: 08/22/2015 Document Reviewed: 05/10/2014 Elsevier Interactive Patient Education  2018 [employer].      IF you received an x-ray today, you will receive an invoice from [facility]. Please contact [facility] at [phone] with questions or concerns regarding your invoice.   IF you received labwork today, you will receive an invoice from [facility]. Please contact [facility] at [phone] with questions or concerns regarding your invoice.   Our billing staff will not be able to assist you with questions regarding bills from these companies.  You will be contacted with the lab results as soon as they are available. The fastest way to get your results is to activate your My Chart account. Instructions are located on the last page of this paperwork. If you have not heard from [place] regarding the results in 2 weeks, please contact this office.

## 2017-02-03 NOTE — Progress Notes (Signed)
PR[facility] AT [facility] [address], [place] [address] [phone]  Date:  02/03/2017   Name:  Phillip Glass   DOB:  03-Jul-1955   MRN:  [id number]  PCP:  Wardell Honour, MD    History of Present Illness:  Phillip Glass is a 61 y.o. male patient who presents to PCP with  Chief Complaint  Patient presents with  . Back Pain    low back pain, pt states the pain started this morning. Pt states he was lifting box drinks yesterday and thinks he may have turned or lifted wrong.,    Pulling 40lb boxes, it was falling over, and he tried to carry the weight on a tilt.  Pain quickly insued at his lower right back.  This occurred yesterday.  He used a heating pad that night as this pain is not new for him.  The next day pain seemed to have improved but quickly resurfaced when he bent forward to pick up material.  Pain is at the right lower back.  No paresthesia.  He took aleve, which helped some, but the pain started again.  paim 4-6/10.  No numbness or tingling.   Patient Active Problem List   Diagnosis Date Noted  . Polyarthralgia 01/18/2014  . Colitis 04/08/2012  . Routine general medical examination at a health care facility 03/03/2012  . Multiple nevi 03/03/2012    Past Medical History:  Diagnosis Date  . Crohn's colitis [facility])     Past Surgical History:  Procedure Laterality Date  . COLONOSCOPY  01/12/2011  . TONSILLECTOMY      Social History   Tobacco Use  . Smoking status: Never Smoker  . Smokeless tobacco: Never Used  Substance Use Topics  . Alcohol use: No    Alcohol/week: 0.0 oz  . Drug use: No    Family History  Problem Relation Age of Onset  . Hypertension Father   . Heart disease Father 55       AMI age 55; second  AMI age 53 cause of death  . Diabetes Sister   . Dementia Mother   . Hypertension Mother     Allergies  Allergen Reactions  . Codeine Other (See Comments)    Pt states he hallucinates    Medication list has been reviewed and  updated.  Current Outpatient Medications on File Prior to Visit  Medication Sig Dispense Refill  . Adalimumab (HUMIRA PEN) 40 MG/0.8ML PNKT Inject 40 mg into the skin.    . Aloe Vera 500 MG CAPS Take 1 capsule by mouth daily.    . balsalazide (COLAZAL) 750 MG capsule Take 4,500 mg by mouth 2 (two) times daily.    Marland Kitchen FOLIC ACID PO Take by mouth daily.    . Garlic 956 MG TABS Take 1 tablet by mouth daily.    . mesalamine (CANASA) 1000 MG suppository Place 1,000 mg rectally at bedtime.    . methotrexate (RHEUMATREX) 10 MG tablet Take 100 mg by mouth once a week. Caution: Chemotherapy. Protect from light.    . Multiple Vitamins-Minerals (MULTIVITAMIN WITH MINERALS) tablet Take 1 tablet by mouth daily.    Marland Kitchen Specialty Vitamins Products (ONE-A-DAY CHOLESTEROL) TABS Take 1 tablet by mouth daily.    . valACYclovir (VALTREX) 1000 MG tablet Take 1 tablet (1,000 mg total) by mouth 3 (three) times daily. 21 tablet 0  . amoxicillin (AMOXIL) 875 MG tablet Take 1 tablet (875 mg total) by mouth 2 (two) times daily. (Patient not  taking: Reported on 02/03/2017) 20 tablet 0  . Bioflavonoid Products (BIOFLEX PO) Take 1 tablet by mouth daily.    . brompheniramine-pseudoephedrine-DM 30-2-10 MG/5ML syrup Take 5 mLs by mouth 4 (four) times daily as needed. (Patient not taking: Reported on 02/03/2017) 120 mL 0  . cyclobenzaprine (FLEXERIL) 5 MG tablet 1 pill by mouth up to every 8 hours as needed. Start with one pill by mouth each bedtime as needed due to sedation (Patient not taking: Reported on 02/03/2017) 15 tablet 0  . Ferrous Gluconate (IRON) 240 (27 FE) MG TABS Take 1 tablet by mouth daily.    . fluticasone (FLONASE) 50 MCG/ACT nasal spray Place 2 sprays into both nostrils daily. (Patient not taking: Reported on 02/03/2017) 16 g 6   No current facility-administered medications on file prior to visit.     ROS ROS otherwise unremarkable unless listed above.  Physical Examination: BP (!) 160/88 (BP Location: Right  Arm, Patient Position: Sitting, Cuff Size: Normal)   Pulse 90   Temp 98.3 F (36.8 C) (Oral)   Resp 18   Ht 5' 6.5" (1.689 m)   Wt 198 lb (89.8 kg)   SpO2 97%   BMI 31.48 kg/m  Ideal Body Weight: Weight in (lb) to have BMI = 25: 156.9  Physical Exam  Constitutional: He is oriented to person, place, and time. He appears well-developed and well-nourished. No distress.  HENT:  Head: Normocephalic and atraumatic.  Eyes: Conjunctivae and EOM are normal. Pupils are equal, round, and reactive to light.  Cardiovascular: Normal rate.  Pulmonary/Chest: Effort normal. No respiratory distress.  Musculoskeletal:       Lumbar back: He exhibits tenderness. He exhibits normal range of motion, no bony tenderness and normal pulse.  Tender at the mid lumbar region at adjacent musculatrure without spinous tenderness.  No muscle spasm detected at this time.   Neurological: He is alert and oriented to person, place, and time.  Skin: Skin is warm and dry. He is not diaphoretic.  Psychiatric: He has a normal mood and affect. His behavior is normal.     Assessment and Plan: Phillip Glass is a 61 y.o. male who is here today for cc of  Chief Complaint  Patient presents with  . Back Pain    low back pain, pt states the pain started this morning. Pt states he was lifting box drinks yesterday and thinks he may have turned or lifted wrong.,   Advised msk relaxant with precautions advised.  Icing regimen discussed as well.  He will take tramadol for the pain at this time.  He will see a therapist tomorrow.  He may need referral which is fine to place within the next 3 weeks.   No diagnosis found.  Ivar Drape, PA-C [facility] 11/7/20186:49 PM

## 2017-03-11 DIAGNOSIS — R739 Hyperglycemia, unspecified: Secondary | ICD-10-CM | POA: Diagnosis not present

## 2017-03-11 DIAGNOSIS — K5010 Crohn's disease of large intestine without complications: Secondary | ICD-10-CM | POA: Diagnosis not present

## 2017-03-24 DIAGNOSIS — K50818 Crohn's disease of both small and large intestine with other complication: Secondary | ICD-10-CM | POA: Diagnosis not present

## 2017-03-24 DIAGNOSIS — M1990 Unspecified osteoarthritis, unspecified site: Secondary | ICD-10-CM | POA: Diagnosis not present

## 2017-04-15 DIAGNOSIS — Z79899 Other long term (current) drug therapy: Secondary | ICD-10-CM | POA: Diagnosis not present

## 2017-04-15 DIAGNOSIS — K5010 Crohn's disease of large intestine without complications: Secondary | ICD-10-CM | POA: Diagnosis not present

## 2017-06-16 DIAGNOSIS — K5010 Crohn's disease of large intestine without complications: Secondary | ICD-10-CM | POA: Diagnosis not present

## 2017-06-19 ENCOUNTER — Other Ambulatory Visit: Payer: Self-pay

## 2017-06-19 ENCOUNTER — Ambulatory Visit: Payer: BLUE CROSS/BLUE SHIELD | Admitting: Physician Assistant

## 2017-06-19 ENCOUNTER — Encounter: Payer: Self-pay | Admitting: Physician Assistant

## 2017-06-19 VITALS — BP 155/96 | HR 75 | Temp 98.4°F | Ht 66.0 in | Wt 199.2 lb

## 2017-06-19 DIAGNOSIS — S46912A Strain of unspecified muscle, fascia and tendon at shoulder and upper arm level, left arm, initial encounter: Secondary | ICD-10-CM | POA: Diagnosis not present

## 2017-06-19 MED ORDER — MELOXICAM 15 MG PO TABS: 15.0000 mg | ORAL_TABLET | Freq: Every day | ORAL | 0 refills | Status: DC

## 2017-06-19 MED ORDER — CYCLOBENZAPRINE HCL 10 MG PO TABS: 5.0000 mg | ORAL_TABLET | Freq: Three times a day (TID) | ORAL | 0 refills | Status: DC | PRN

## 2017-06-19 NOTE — Patient Instructions (Addendum)
Please ice directly after stretches.  I would like you to perform three pictures three times per day.   meloxicam--do not take this with ibuprofen or naproxen.  Try this for 2 weeks. Flexeril--this may cause sedation.  Do not take with operating heavy machinery.   Shoulder Exercises Ask your health care provider which exercises are safe for you. Do exercises exactly as told by your health care provider and adjust them as directed. It is normal to feel mild stretching, pulling, tightness, or discomfort as you do these exercises, but you should stop right away if you feel sudden pain or your pain gets worse.Do not begin these exercises until told by your health care provider. RANGE OF MOTION EXERCISES These exercises warm up your muscles and joints and improve the movement and flexibility of your shoulder. These exercises also help to relieve pain, numbness, and tingling. These exercises involve stretching your injured shoulder directly. Exercise A: Pendulum  1. Stand near a wall or a surface that you can hold onto for balance. 2. Bend at the waist and let your left / right arm hang straight down. Use your other arm to support you. Keep your back straight and do not lock your knees. 3. Relax your left / right arm and shoulder muscles, and move your hips and your trunk so your left / right arm swings freely. Your arm should swing because of the motion of your body, not because you are using your arm or shoulder muscles. 4. Keep moving your body so your arm swings in the following directions, as told by your health care provider: ? Side to side. ? Forward and backward. ? In clockwise and counterclockwise circles. 5. Continue each motion for __________ seconds, or for as long as told by your health care provider. 6. Slowly return to the starting position. Repeat __________ times. Complete this exercise __________ times a day. Exercise B:Flexion, Standing  1. Stand and hold a broomstick, a cane, or  a similar object. Place your hands a little more than shoulder-width apart on the object. Your left / right hand should be palm-up, and your other hand should be palm-down. 2. Keep your elbow straight and keep your shoulder muscles relaxed. Push the stick down with your healthy arm to raise your left / right arm in front of your body, and then over your head until you feel a stretch in your shoulder. ? Avoid shrugging your shoulder while you raise your arm. Keep your shoulder blade tucked down toward the middle of your back. 3. Hold for __________ seconds. 4. Slowly return to the starting position. Repeat __________ times. Complete this exercise __________ times a day. Exercise C: Abduction, Standing 1. Stand and hold a broomstick, a cane, or a similar object. Place your hands a little more than shoulder-width apart on the object. Your left / right hand should be palm-up, and your other hand should be palm-down. 2. While keeping your elbow straight and your shoulder muscles relaxed, push the stick across your body toward your left / right side. Raise your left / right arm to the side of your body and then over your head until you feel a stretch in your shoulder. ? Do not raise your arm above shoulder height, unless your health care provider tells you to do that. ? Avoid shrugging your shoulder while you raise your arm. Keep your shoulder blade tucked down toward the middle of your back. 3. Hold for __________ seconds. 4. Slowly return to the starting position.  Repeat __________ times. Complete this exercise __________ times a day. Exercise D:Internal Rotation  1. Place your left / right hand behind your back, palm-up. 2. Use your other hand to dangle an exercise band, a towel, or a similar object over your shoulder. Grasp the band with your left / right hand so you are holding onto both ends. 3. Gently pull up on the band until you feel a stretch in the front of your left / right  shoulder. ? Avoid shrugging your shoulder while you raise your arm. Keep your shoulder blade tucked down toward the middle of your back. 4. Hold for __________ seconds. 5. Release the stretch by letting go of the band and lowering your hands. Repeat __________ times. Complete this exercise __________ times a day. STRETCHING EXERCISES These exercises warm up your muscles and joints and improve the movement and flexibility of your shoulder. These exercises also help to relieve pain, numbness, and tingling. These exercises are done using your healthy shoulder to help stretch the muscles of your injured shoulder. Exercise E: Warehouse manager (External Rotation and Abduction)  1. Stand in a doorway with one of your feet slightly in front of the other. This is called a staggered stance. If you cannot reach your forearms to the door frame, stand facing a corner of a room. 2. Choose one of the following positions as told by your health care provider: ? Place your hands and forearms on the door frame above your head. ? Place your hands and forearms on the door frame at the height of your head. ? Place your hands on the door frame at the height of your elbows. 3. Slowly move your weight onto your front foot until you feel a stretch across your chest and in the front of your shoulders. Keep your head and chest upright and keep your abdominal muscles tight. 4. Hold for __________ seconds. 5. To release the stretch, shift your weight to your back foot. Repeat __________ times. Complete this stretch __________ times a day. Exercise F:Extension, Standing 1. Stand and hold a broomstick, a cane, or a similar object behind your back. ? Your hands should be a little wider than shoulder-width apart. ? Your palms should face away from your back. 2. Keeping your elbows straight and keeping your shoulder muscles relaxed, move the stick away from your body until you feel a stretch in your shoulder. ? Avoid shrugging  your shoulders while you move the stick. Keep your shoulder blade tucked down toward the middle of your back. 3. Hold for __________ seconds. 4. Slowly return to the starting position. Repeat __________ times. Complete this exercise __________ times a day. STRENGTHENING EXERCISES These exercises build strength and endurance in your shoulder. Endurance is the ability to use your muscles for a long time, even after they get tired. Exercise G:External Rotation  1. Sit in a stable chair without armrests. 2. Secure an exercise band at elbow height on your left / right side. 3. Place a soft object, such as a folded towel or a small pillow, between your left / right upper arm and your body to move your elbow a few inches away (about 10 cm) from your side. 4. Hold the end of the band so it is tight and there is no slack. 5. Keeping your elbow pressed against the soft object, move your left / right forearm out, away from your abdomen. Keep your body steady so only your forearm moves. 6. Hold for __________ seconds. 7. Slowly  return to the starting position. Repeat __________ times. Complete this exercise __________ times a day. Exercise H:Shoulder Abduction  1. Sit in a stable chair without armrests, or stand. 2. Hold a __________ weight in your left / right hand, or hold an exercise band with both hands. 3. Start with your arms straight down and your left / right palm facing in, toward your body. 4. Slowly lift your left / right hand out to your side. Do not lift your hand above shoulder height unless your health care provider tells you that this is safe. ? Keep your arms straight. ? Avoid shrugging your shoulder while you do this movement. Keep your shoulder blade tucked down toward the middle of your back. 5. Hold for __________ seconds. 6. Slowly lower your arm, and return to the starting position. Repeat __________ times. Complete this exercise __________ times a day. Exercise I:Shoulder  Extension 1. Sit in a stable chair without armrests, or stand. 2. Secure an exercise band to a stable object in front of you where it is at shoulder height. 3. Hold one end of the exercise band in each hand. Your palms should face each other. 4. Straighten your elbows and lift your hands up to shoulder height. 5. Step back, away from the secured end of the exercise band, until the band is tight and there is no slack. 6. Squeeze your shoulder blades together as you pull your hands down to the sides of your thighs. Stop when your hands are straight down by your sides. Do not let your hands go behind your body. 7. Hold for __________ seconds. 8. Slowly return to the starting position. Repeat __________ times. Complete this exercise __________ times a day. Exercise J:Standing Shoulder Row 1. Sit in a stable chair without armrests, or stand. 2. Secure an exercise band to a stable object in front of you so it is at waist height. 3. Hold one end of the exercise band in each hand. Your palms should be in a thumbs-up position. 4. Bend each of your elbows to an "L" shape (about 90 degrees) and keep your upper arms at your sides. 5. Step back until the band is tight and there is no slack. 6. Slowly pull your elbows back behind you. 7. Hold for __________ seconds. 8. Slowly return to the starting position. Repeat __________ times. Complete this exercise __________ times a day. Exercise K:Shoulder Press-Ups  1. Sit in a stable chair that has armrests. Sit upright, with your feet flat on the floor. 2. Put your hands on the armrests so your elbows are bent and your fingers are pointing forward. Your hands should be about even with the sides of your body. 3. Push down on the armrests and use your arms to lift yourself off of the chair. Straighten your elbows and lift yourself up as much as you comfortably can. ? Move your shoulder blades down, and avoid letting your shoulders move up toward your  ears. ? Keep your feet on the ground. As you get stronger, your feet should support less of your body weight as you lift yourself up. 4. Hold for __________ seconds. 5. Slowly lower yourself back into the chair. Repeat __________ times. Complete this exercise __________ times a day. Exercise L: Wall Push-Ups  1. Stand so you are facing a stable wall. Your feet should be about one arm-length away from the wall. 2. Lean forward and place your palms on the wall at shoulder height. 3. Keep your feet flat on  the floor as you bend your elbows and lean forward toward the wall. 4. Hold for __________ seconds. 5. Straighten your elbows to push yourself back to the starting position. Repeat __________ times. Complete this exercise __________ times a day. This information is not intended to replace advice given to you by your health care provider. Make sure you discuss any questions you have with your health care provider. Document Released: 01/28/2005 Document Revised: 12/09/2015 Document Reviewed: 11/25/2014 Elsevier Interactive Patient Education  2018 [employer].    IF you received an x-ray today, you will receive an invoice from [facility]. Please contact [facility] at [phone] with questions or concerns regarding your invoice.   IF you received labwork today, you will receive an invoice from [facility]. Please contact [facility] at [phone] with questions or concerns regarding your invoice.   Our billing staff will not be able to assist you with questions regarding bills from these companies.  You will be contacted with the lab results as soon as they are available. The fastest way to get your results is to activate your My Chart account. Instructions are located on the last page of this paperwork. If you have not heard from [place] regarding the results in 2 weeks, please contact this office.

## 2017-06-19 NOTE — Progress Notes (Signed)
PRIMARY CARE AT [facility] [address], [place] [address] [phone]  Date:  06/19/2017   Name:  Phillip Glass   DOB:  08-Mar-1956   MRN:  [id number]  PCP:  Wardell Honour, MD    History of Present Illness:  Phillip Glass is a 62 y.o. male patient who presents to PCP with  Chief Complaint  Patient presents with  . Shoulder Pain    Having left shoulder pain. Does not know the onset on the pain. Does workout with trainer. Does not think that is the reason for his shoulder pain. Denies falling     Left shoulder pain that has worsened, where he can not put his jacket on.  He has no swelling of the area.  He worked out for [employer] 2 days.  No neck pain.  There is pain at the anterior area of the joint.  He can go back in his shoulder and feel a sharp pain.  He has taken nothing for this pain.  Hurts to extend back his arm.     Patient Active Problem List   Diagnosis Date Noted  . Polyarthralgia 01/18/2014  . Colitis 04/08/2012  . Routine general medical examination at a health care facility 03/03/2012  . Multiple nevi 03/03/2012    Past Medical History:  Diagnosis Date  . Crohn's colitis [facility])     Past Surgical History:  Procedure Laterality Date  . COLONOSCOPY  01/12/2011  . TONSILLECTOMY      Social History   Tobacco Use  . Smoking status: Never Smoker  . Smokeless tobacco: Never Used  Substance Use Topics  . Alcohol use: No    Alcohol/week: 0.0 oz  . Drug use: No    Family History  Problem Relation Age of Onset  . Hypertension Father   . Heart disease Father 34       AMI age 61; second  AMI age 78 cause of death  . Diabetes Sister   . Dementia Mother   . Hypertension Mother     Allergies  Allergen Reactions  . Codeine Other (See Comments)    Pt states he hallucinates    Medication list has been reviewed and updated.  Current Outpatient Medications on File Prior to Visit  Medication Sig Dispense Refill  . Adalimumab (HUMIRA PEN) 40 MG/0.8ML  PNKT Inject 40 mg into the skin.    . Aloe Vera 500 MG CAPS Take 1 capsule by mouth daily.    . balsalazide (COLAZAL) 750 MG capsule Take 4,500 mg by mouth 2 (two) times daily.    Marland Kitchen Bioflavonoid Products (BIOFLEX PO) Take 1 tablet by mouth daily.    . brompheniramine-pseudoephedrine-DM 30-2-10 MG/5ML syrup Take 5 mLs by mouth 4 (four) times daily as needed. 149 mL 0  . FOLIC ACID PO Take by mouth daily.    . Garlic 702 MG TABS Take 1 tablet by mouth daily.    . mesalamine (CANASA) 1000 MG suppository Place 1,000 mg rectally at bedtime.    . methotrexate (RHEUMATREX) 10 MG tablet Take 100 mg by mouth once a week. Caution: Chemotherapy. Protect from light.    . Multiple Vitamins-Minerals (MULTIVITAMIN WITH MINERALS) tablet Take 1 tablet by mouth daily.    Marland Kitchen Specialty Vitamins Products (ONE-A-DAY CHOLESTEROL) TABS Take 1 tablet by mouth daily.    Marland Kitchen amoxicillin (AMOXIL) 875 MG tablet Take 1 tablet (875 mg total) by mouth 2 (two) times daily. (Patient not taking: Reported on 02/03/2017) 20 tablet  0  . cyclobenzaprine (FLEXERIL) 10 MG tablet Take 0.5-1 tablets (5-10 mg total) 3 (three) times daily as needed by mouth. (Patient not taking: Reported on 06/19/2017) 30 tablet 0  . cyclobenzaprine (FLEXERIL) 5 MG tablet 1 pill by mouth up to every 8 hours as needed. Start with one pill by mouth each bedtime as needed due to sedation (Patient not taking: Reported on 02/03/2017) 15 tablet 0  . Ferrous Gluconate (IRON) 240 (27 FE) MG TABS Take 1 tablet by mouth daily.    . fluticasone (FLONASE) 50 MCG/ACT nasal spray Place 2 sprays into both nostrils daily. (Patient not taking: Reported on 02/03/2017) 16 g 6  . traMADol (ULTRAM) 50 MG tablet Take 1 tablet (50 mg total) every 8 (eight) hours as needed by mouth. (Patient not taking: Reported on 06/19/2017) 21 tablet 0  . valACYclovir (VALTREX) 1000 MG tablet Take 1 tablet (1,000 mg total) by mouth 3 (three) times daily. (Patient not taking: Reported on 06/19/2017) 21  tablet 0   No current facility-administered medications on file prior to visit.     ROS ROS otherwise unremarkable unless listed above.  Physical Examination: BP (!) 155/96   Pulse 75   Temp 98.4 F (36.9 C) (Oral)   Ht 5' 6"  (1.676 m)   Wt 199 lb 3.2 oz (90.4 kg)   SpO2 96%   BMI 32.15 kg/m  Ideal Body Weight: Weight in (lb) to have BMI = 25: 154.6  Physical Exam  Constitutional: He is oriented to person, place, and time. He appears well-developed and well-nourished. No distress.  HENT:  Head: Normocephalic and atraumatic.  Eyes: Pupils are equal, round, and reactive to light. Conjunctivae and EOM are normal.  Cardiovascular: Normal rate.  Pulmonary/Chest: Effort normal. No respiratory distress.  Musculoskeletal:       Left shoulder: He exhibits normal range of motion, no tenderness, no swelling and no spasm.  Pain with internal rotation.   Neurological: He is alert and oriented to person, place, and time.  Skin: Skin is warm and dry. He is not diaphoretic.  Psychiatric: He has a normal mood and affect. His behavior is normal.   Assessment and Plan: Phillip Glass is a 62 y.o. male who is here today for cc of  Chief Complaint  Patient presents with  . Shoulder Pain    Having left shoulder pain. Does not know the onset on the pain. Does workout with trainer. Does not think that is the reason for his shoulder pain. Denies falling  Precaution of the anti-inflammatory was advised.  Also discussed muscle relaxants.  Warned of sedating effects.  He will return in 2 weeks if his symptoms do not improve. Stretches and icing regimen instructed. Shoulder strain, left, initial encounter - Plan: DISCONTINUED: meloxicam (MOBIC) 15 MG tablet, DISCONTINUED: cyclobenzaprine (FLEXERIL) 10 MG tablet  Ivar Drape, PA-C [facility] 3/29/201912:19 PM

## 2017-06-20 ENCOUNTER — Ambulatory Visit (HOSPITAL_COMMUNITY)
Admission: EM | Admit: 2017-06-20 | Discharge: 2017-06-20 | Disposition: A | Discharge status: Home / Self Care | Payer: BLUE CROSS/BLUE SHIELD | Attending: Physician Assistant | Admitting: Physician Assistant

## 2017-06-20 ENCOUNTER — Encounter (HOSPITAL_COMMUNITY): Payer: Self-pay | Admitting: Emergency Medicine

## 2017-06-20 DIAGNOSIS — I10 Essential (primary) hypertension: Secondary | ICD-10-CM

## 2017-06-20 MED ORDER — AMLODIPINE BESYLATE 5 MG PO TABS: ORAL_TABLET | ORAL | 0 refills | Status: DC

## 2017-06-20 NOTE — ED Notes (Addendum)
Started feeling numbness in right arm and right leg while driving here.  Pressure feeling in left side of scalp.  "little pressure".    Denies chest pain.  Left arm soreness with movement-was evaluated at [place] yesterday.  Reports bp high yesterday and was not given anything for it. Patient has been checking bp several times at home prior to coming to [facility]

## 2017-06-20 NOTE — ED Provider Notes (Signed)
06/20/2017 7:18 PM   DOB: 12/26/1955 / MRN: [id number]  SUBJECTIVE:  Phillip Glass is a 62 y.o. male presenting for numbness about the right hand.  Patient tells me that this is coming and going.  Was told that his new GI office about 3 months ago that his blood pressure was elevated and states it is been creeping up since then.  He is gained 30 pounds in the last 3 months secondary to a back injury.  He denies a history of smoking, is not a drinker, has never had hypertension.  He denies a history of diabetes.   He is allergic to codeine.   He  has a past medical history of Crohn's colitis ([place]).    He  reports that he has never smoked. He has never used smokeless tobacco. He reports that he does not drink alcohol or use drugs. He  reports that he does not engage in sexual activity. The patient  has a past surgical history that includes Colonoscopy (01/12/2011) and Tonsillectomy.  His family history includes Dementia in his mother; Diabetes in his sister; Heart disease (age of onset: 33) in his father; Hypertension in his father and mother.  Review of Systems  Constitutional: Negative for chills, diaphoresis and fever.  Respiratory: Negative for cough, hemoptysis, sputum production, shortness of breath and wheezing.   Cardiovascular: Negative for chest pain and leg swelling.  Gastrointestinal: Negative for nausea.  Skin: Negative for rash.  Neurological: Positive for tingling. Negative for dizziness, tremors, sensory change, speech change, focal weakness, seizures, loss of consciousness and weakness.    OBJECTIVE:  BP (!) 186/92 (BP Location: Left Arm)   Pulse 88   Temp 98 F (36.7 C) (Oral)   SpO2 99%   BP Readings from Last 3 Encounters:  06/20/17 (!) 186/92  06/19/17 (!) 155/96  02/03/17 (!) 160/88   Lab Results  Component Value Date   CREATININE 1.03 01/04/2014   Lab Results  Component Value Date   HGBA1C 5.7 03/03/2012     Physical Exam  Constitutional: He is  oriented to person, place, and time. He appears well-developed. He is active and cooperative.  Non-toxic appearance.  Eyes: Pupils are equal, round, and reactive to light. EOM are normal.  Cardiovascular: Normal rate, regular rhythm, S1 normal, S2 normal, normal heart sounds, intact distal pulses and normal pulses. Exam reveals no gallop and no friction rub.  No murmur heard. Pulmonary/Chest: Effort normal. No stridor. No tachypnea. No respiratory distress. He has no wheezes. He has no rales.  Abdominal: He exhibits no distension.  Musculoskeletal: He exhibits no edema.  Neurological: He is alert and oriented to person, place, and time. He has normal strength and normal reflexes. He is not disoriented. No cranial nerve deficit or sensory deficit. He exhibits normal muscle tone. Coordination and gait normal.  Skin: Skin is warm and dry. He is not diaphoretic. No pallor.  Psychiatric: His behavior is normal.  Vitals reviewed.   No results found for this or any previous visit (from the past 72 hour(s)).  No results found.  ASSESSMENT AND PLAN:   Uncontrolled hypertension - Excellent neurological exam today.  He needs blood pressure medicine.  Starting him on Norvasc 5 this his pressure was not quite as high yesterday as it was today.  I think there is certainly an anxiety component to his blood pressure today.he has follow-up planned with his primary in a few days.  I will start him on Norvasc until that time.  The patient is advised to call or return to clinic if he does not see an improvement in symptoms, or to seek the care of the closest emergency department if he worsens with the above plan.   Philis Fendt, MHS, PA-C 06/20/2017 7:18 PM    Tereasa Coop, PA-C 06/20/17 1920

## 2017-06-20 NOTE — Discharge Instructions (Signed)
Go ahead and start the Norvasc as prescribed.  Your blood pressure should be better tomorrow.  If after 7 days the pressure remains elevated then take 2 tabs.  See the signature on the medication for specific instructions here.

## 2017-06-24 ENCOUNTER — Encounter: Payer: Self-pay | Admitting: Urgent Care

## 2017-06-24 ENCOUNTER — Ambulatory Visit (INDEPENDENT_AMBULATORY_CARE_PROVIDER_SITE_OTHER): Payer: BLUE CROSS/BLUE SHIELD | Admitting: Urgent Care

## 2017-06-24 VITALS — BP 144/78 | HR 66 | Temp 98.3°F | Ht 67.0 in | Wt 198.6 lb

## 2017-06-24 DIAGNOSIS — Z114 Encounter for screening for human immunodeficiency virus [HIV]: Secondary | ICD-10-CM

## 2017-06-24 DIAGNOSIS — Z23 Encounter for immunization: Secondary | ICD-10-CM

## 2017-06-24 DIAGNOSIS — Z130 Encounter for screening for diseases of the blood and blood-forming organs and certain disorders involving the immune mechanism: Secondary | ICD-10-CM | POA: Diagnosis not present

## 2017-06-24 DIAGNOSIS — Z125 Encounter for screening for malignant neoplasm of prostate: Secondary | ICD-10-CM

## 2017-06-24 DIAGNOSIS — Z0000 Encounter for general adult medical examination without abnormal findings: Secondary | ICD-10-CM

## 2017-06-24 DIAGNOSIS — K50919 Crohn's disease, unspecified, with unspecified complications: Secondary | ICD-10-CM | POA: Diagnosis not present

## 2017-06-24 DIAGNOSIS — Z13220 Encounter for screening for lipoid disorders: Secondary | ICD-10-CM

## 2017-06-24 DIAGNOSIS — I10 Essential (primary) hypertension: Secondary | ICD-10-CM

## 2017-06-24 DIAGNOSIS — Z1159 Encounter for screening for other viral diseases: Secondary | ICD-10-CM

## 2017-06-24 DIAGNOSIS — Z1329 Encounter for screening for other suspected endocrine disorder: Secondary | ICD-10-CM

## 2017-06-24 NOTE — Progress Notes (Signed)
MRN: [id number]  Subjective:   [name] is a 62 y.o. male presenting for annual physical exam. Works at 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 He is divorced. Has good relationships at home, has a good support network. Denies smoking cigarettes or drinking alcohol.   Medical care team includes: PCP: Jaynee Eagles, PA-C Vision: Wears glasses, has eye exam scheduled next month. Dental: Gets regular dental care. Specialists: Dr. Debarah Crape and Dr. Carlota Raspberry manage his Crohn's disease.   HTN - Was recently started on amlodipine 59m for his HTN. He exercises vigorously, is trying to eat healthily. He is not checking his blood pressure at home.   Health Maintenance: Needs tdap updated. Colonoscopy was completed 2016.   [place] medication list which includes the following prescription(s): adalimumab, aloe vera, amlodipine, balsalazide, brompheniramine-pseudoephedrine-dm, folic acid, garlic, mesalamine, methotrexate, multivitamin with minerals, and one-a-day cholesterol. He is allergic to codeine.  Phillip Glass has a past medical history of Cro[place]'s colitis [place]. Also  has a past surgical history that includes Colonoscopy (01/12/2011) and Tonsillectomy.   His family history includes Dementia in his mother; Diabetes in his sister; Heart disease (age of onset: 639 in his father; Hypertension in his father and mother.  Review of Systems  Constitutional: Negative for chills, diaphoresis, fever, malaise/fatigue and weight loss.  HENT: Negative for congestion, ear discharge, ear pain, hearing loss, nosebleeds, sore throat and tinnitus.   Eyes: Negative for blurred vision, double vision, photophobia, pain, discharge and redness.  Respiratory: Negative for cough, shortness of breath and wheezing.   Cardiovascular: Negative for chest pain, palpitations and leg swelling.  Gastrointestinal: Negative for abdominal pain, blood in stool, constipation,  diarrhea, nausea and vomiting.  Genitourinary: Negative for dysuria, flank pain, frequency, hematuria and urgency.  Musculoskeletal: Negative for back pain, joint pain and myalgias.  Skin: Negative for itching and rash.  Neurological: Negative for dizziness, tingling, seizures, loss of consciousness, weakness and headaches.  Endo/Heme/Allergies: Negative for polydipsia.  Psychiatric/Behavioral: Negative for depression, hallucinations, memory loss, substance abuse and suicidal ideas. The patient is not nervous/anxious and does not have insomnia.    Objective:   Vitals: BP (!) 144/78 (BP Location: Left Arm, Patient Position: Sitting, Cuff Size: Normal)   Pulse 66   Temp 98.3 F (36.8 C) (Oral)   Ht 5' 7"  (1.702 m)   Wt 198 lb 9.6 oz (90.1 kg)   SpO2 97%   BMI 31.11 kg/m   BP Readings from Last 3 Encounters:  06/24/17 (!) 144/78  06/20/17 (!) 186/92  06/19/17 (!) 155/96    Visual Acuity Screening   Right eye Left eye Both eyes  Without correction:     With correction: 20/20 20/25 20/20     Physical Exam  Constitutional: He is oriented to person, place, and time. He appears well-developed and well-nourished.  HENT:  TM's intact bilaterally, no effusions or erythema. Nasal turbinates pink and moist, nasal passages patent. No sinus tenderness. Oropharynx clear, mucous membranes moist, dentition in good repair.  Eyes: Pupils are equal, round, and reactive to light. Conjunctivae and EOM are normal. Right eye exhibits no discharge. Left eye exhibits no discharge. No scleral icterus.  Neck: Normal range of motion. Neck supple. No thyromegaly present.  Cardiovascular: Normal rate, regular rhythm and intact distal pulses. Exam reveals no gallop and no friction rub.  No murmur heard. Pulmonary/Chest: No stridor. No respiratory distress. He has no wheezes. He has no rales.  Abdominal: Soft. Bowel sounds are normal. He exhibits no distension and no mass. There is no tenderness.    Musculoskeletal: Normal range of motion. He exhibits no edema or tenderness.  Lymphadenopathy:    He has no cervical adenopathy.  Neurological: He is alert and oriented to person, place, and time. He has normal reflexes.  Skin: Skin is warm and dry. No rash noted. No erythema. No pallor.  Psychiatric: He has a normal mood and affect.   Assessment and Plan :   Annual physical exam  Screening for HIV (human immunodeficiency virus) - Plan: HIV antibody  Screening cholesterol level - Plan: Lipid panel  Encounter for hepatitis C screening test for low risk patient - Plan: Hepatitis C antibody  Screening for thyroid disorder - Plan: TSH  Screening for deficiency anemia - Plan: CBC  Screening for prostate cancer - Plan: PSA  Essential hypertension - Plan: Comprehensive metabolic panel  Crohn's disease with complication, unspecified gastrointestinal tract location [facility])  Medically stable, labs pending. Discussed healthy lifestyle, diet, exercise, preventative care, vaccinations, and addressed patient's concerns. Patient will work on weight loss, does not want changes to blood pressure medication or to add a cholesterol medication. Will follow up in 4 weeks.  Jaynee Eagles, PA-C [facility] [phone] 06/24/2017  8:50 AM

## 2017-06-24 NOTE — Patient Instructions (Addendum)
Health Maintenance, Male A healthy lifestyle and preventive care is important for your health and wellness. Ask your health care provider about what schedule of regular examinations is right for you. What should I know about weight and diet? Eat a Healthy Diet  Eat plenty of vegetables, fruits, whole grains, low-fat dairy products, and lean protein.  Do not eat a lot of foods high in solid fats, added sugars, or salt.  Maintain a Healthy Weight Regular exercise can help you achieve or maintain a healthy weight. You should:  Do at least 150 minutes of exercise each week. The exercise should increase your heart rate and make you sweat (moderate-intensity exercise).  Do strength-training exercises at least twice a week.  Watch Your Levels of Cholesterol and Blood Lipids  Have your blood tested for lipids and cholesterol every 5 years starting at 62 years of age. If you are at high risk for heart disease, you should start having your blood tested when you are 62 years old. You may need to have your cholesterol levels checked more often if: ? Your lipid or cholesterol levels are high. ? You are older than 62 years of age. ? You are at high risk for heart disease.  What should I know about cancer screening? Many types of cancers can be detected early and may often be prevented. Lung Cancer  You should be screened every year for lung cancer if: ? You are a current smoker who has smoked for at least 30 years. ? You are a former smoker who has quit within the past 15 years.  Talk to your health care provider about your screening options, when you should start screening, and how often you should be screened.  Colorectal Cancer  Routine colorectal cancer screening usually begins at 62 years of age and should be repeated every 5-10 years until you are 62 years old. You may need to be screened more often if early forms of precancerous polyps or small growths are found. Your health care provider  may recommend screening at an earlier age if you have risk factors for colon cancer.  Your health care provider may recommend using home test kits to check for hidden blood in the stool.  A small camera at the end of a tube can be used to examine your colon (sigmoidoscopy or colonoscopy). This checks for the earliest forms of colorectal cancer.  Prostate and Testicular Cancer  Depending on your age and overall health, your health care provider may do certain tests to screen for prostate and testicular cancer.  Talk to your health care provider about any symptoms or concerns you have about testicular or prostate cancer.  Skin Cancer  Check your skin from head to toe regularly.  Tell your health care provider about any new moles or changes in moles, especially if: ? There is a change in a mole's size, shape, or color. ? You have a mole that is larger than a pencil eraser.  Always use sunscreen. Apply sunscreen liberally and repeat throughout the day.  Protect yourself by wearing long sleeves, pants, a wide-brimmed hat, and sunglasses when outside.  What should I know about heart disease, diabetes, and high blood pressure?  If you are 75-11 years of age, have your blood pressure checked every 3-5 years. If you are 56 years of age or older, have your blood pressure checked every year. You should have your blood pressure measured twice-once when you are at a hospital or clinic, and once when  you are not at a hospital or clinic. Record the average of the two measurements. To check your blood pressure when you are not at a hospital or clinic, you can use: ? An automated blood pressure machine at a pharmacy. ? A home blood pressure monitor.  Talk to your health care provider about your target blood pressure.  If you are between 37-76 years old, ask your health care provider if you should take aspirin to prevent heart disease.  Have regular diabetes screenings by checking your fasting blood  sugar level. ? If you are at a normal weight and have a low risk for diabetes, have this test once every three years after the age of 83. ? If you are overweight and have a high risk for diabetes, consider being tested at a younger age or more often.  A one-time screening for abdominal aortic aneurysm (AAA) by ultrasound is recommended for men aged 48-75 years who are current or former smokers. What should I know about preventing infection? Hepatitis B If you have a higher risk for hepatitis B, you should be screened for this virus. Talk with your health care provider to find out if you are at risk for hepatitis B infection. Hepatitis C Blood testing is recommended for:  Everyone born from [birth year] through 1965.  Anyone with known risk factors for hepatitis C.  Sexually Transmitted Diseases (STDs)  You should be screened each year for STDs including gonorrhea and chlamydia if: ? You are sexually active and are younger than 62 years of age. ? You are older than 62 years of age and your health care provider tells you that you are at risk for this type of infection. ? Your sexual activity has changed since you were last screened and you are at an increased risk for chlamydia or gonorrhea. Ask your health care provider if you are at risk.  Talk with your health care provider about whether you are at high risk of being infected with HIV. Your health care provider may recommend a prescription medicine to help prevent HIV infection.  What else can I do?  Schedule regular health, dental, and eye exams.  Stay current with your vaccines (immunizations).  Do not use any tobacco products, such as cigarettes, chewing tobacco, and e-cigarettes. If you need help quitting, ask your health care provider.  Limit alcohol intake to no more than 2 drinks per day. One drink equals 12 ounces of beer, 5 ounces of wine, or 1 ounces of hard liquor.  Do not use street drugs.  Do not share needles.  Ask your  health care provider for help if you need support or information about quitting drugs.  Tell your health care provider if you often feel depressed.  Tell your health care provider if you have ever been abused or do not feel safe at home. This information is not intended to replace advice given to you by your health care provider. Make sure you discuss any questions you have with your health care provider. Document Released: 09/12/2007 Document Revised: 11/13/2015 Document Reviewed: 12/18/2014 Elsevier Interactive Patient Education  2018 [employer].     Hypertension Hypertension, commonly called high blood pressure, is when the force of blood pumping through the arteries is too strong. The arteries are the blood vessels that carry blood from the heart throughout the body. Hypertension forces the heart to work harder to pump blood and may cause arteries to become narrow or stiff. Having untreated or uncontrolled hypertension can cause  heart attacks, strokes, kidney disease, and other problems. A blood pressure reading consists of a higher number over a lower number. Ideally, your blood pressure should be below 120/80. The first ("top") number is called the systolic pressure. It is a measure of the pressure in your arteries as your heart beats. The second ("bottom") number is called the diastolic pressure. It is a measure of the pressure in your arteries as the heart relaxes. What are the causes? The cause of this condition is not known. What increases the risk? Some risk factors for high blood pressure are under your control. Others are not. Factors you can change  Smoking.  Having type 2 diabetes mellitus, high cholesterol, or both.  Not getting enough exercise or physical activity.  Being overweight.  Having too much fat, sugar, calories, or salt (sodium) in your diet.  Drinking too much alcohol. Factors that are difficult or impossible to change  Having chronic kidney  disease.  Having a family history of high blood pressure.  Age. Risk increases with age.  Race. You may be at higher risk if you are African-American.  Gender. Men are at higher risk than women before age 68. After age 41, women are at higher risk than men.  Having obstructive sleep apnea.  Stress. What are the signs or symptoms? Extremely high blood pressure (hypertensive crisis) may cause:  Headache.  Anxiety.  Shortness of breath.  Nosebleed.  Nausea and vomiting.  Severe chest pain.  Jerky movements you cannot control (seizures).  How is this diagnosed? This condition is diagnosed by measuring your blood pressure while you are seated, with your arm resting on a surface. The cuff of the blood pressure monitor will be placed directly against the skin of your upper arm at the level of your heart. It should be measured at least twice using the same arm. Certain conditions can cause a difference in blood pressure between your right and left arms. Certain factors can cause blood pressure readings to be lower or higher than normal (elevated) for a short period of time:  When your blood pressure is higher when you are in a health care provider's office than when you are at home, this is called white coat hypertension. Most people with this condition do not need medicines.  When your blood pressure is higher at home than when you are in a health care provider's office, this is called masked hypertension. Most people with this condition may need medicines to control blood pressure.  If you have a high blood pressure reading during one visit or you have normal blood pressure with other risk factors:  You may be asked to return on a different day to have your blood pressure checked again.  You may be asked to monitor your blood pressure at home for 1 week or longer.  If you are diagnosed with hypertension, you may have other blood or imaging tests to help your health care provider  understand your overall risk for other conditions. How is this treated? This condition is treated by making healthy lifestyle changes, such as eating healthy foods, exercising more, and reducing your alcohol intake. Your health care provider may prescribe medicine if lifestyle changes are not enough to get your blood pressure under control, and if:  Your systolic blood pressure is above 130.  Your diastolic blood pressure is above 80.  Your personal target blood pressure may vary depending on your medical conditions, your age, and other factors. Follow these instructions at home:  Eating and drinking  Eat a diet that is high in fiber and potassium, and low in sodium, added sugar, and fat. An example eating plan is called the DASH (Dietary Approaches to Stop Hypertension) diet. To eat this way: ? Eat plenty of fresh fruits and vegetables. Try to fill half of your plate at each meal with fruits and vegetables. ? Eat whole grains, such as whole wheat pasta, brown rice, or whole grain bread. Fill about one quarter of your plate with whole grains. ? Eat or drink low-fat dairy products, such as skim milk or low-fat yogurt. ? Avoid fatty cuts of meat, processed or cured meats, and poultry with skin. Fill about one quarter of your plate with lean proteins, such as fish, chicken without skin, beans, eggs, and tofu. ? Avoid premade and processed foods. These tend to be higher in sodium, added sugar, and fat.  Reduce your daily sodium intake. Most people with hypertension should eat less than 1,500 mg of sodium a day.  Limit alcohol intake to no more than 1 drink a day for nonpregnant women and 2 drinks a day for men. One drink equals 12 oz of beer, 5 oz of wine, or 1 oz of hard liquor. Lifestyle  Work with your health care provider to maintain a healthy body weight or to lose weight. Ask what an ideal weight is for you.  Get at least 30 minutes of exercise that causes your heart to beat faster  (aerobic exercise) most days of the week. Activities may include walking, swimming, or biking.  Include exercise to strengthen your muscles (resistance exercise), such as pilates or lifting weights, as part of your weekly exercise routine. Try to do these types of exercises for 30 minutes at least 3 days a week.  Do not use any products that contain nicotine or tobacco, such as cigarettes and e-cigarettes. If you need help quitting, ask your health care provider.  Monitor your blood pressure at home as told by your health care provider.  Keep all follow-up visits as told by your health care provider. This is important. Medicines  Take over-the-counter and prescription medicines only as told by your health care provider. Follow directions carefully. Blood pressure medicines must be taken as prescribed.  Do not skip doses of blood pressure medicine. Doing this puts you at risk for problems and can make the medicine less effective.  Ask your health care provider about side effects or reactions to medicines that you should watch for. Contact a health care provider if:  You think you are having a reaction to a medicine you are taking.  You have headaches that keep coming back (recurring).  You feel dizzy.  You have swelling in your ankles.  You have trouble with your vision. Get help right away if:  You develop a severe headache or confusion.  You have unusual weakness or numbness.  You feel faint.  You have severe pain in your chest or abdomen.  You vomit repeatedly.  You have trouble breathing. Summary  Hypertension is when the force of blood pumping through your arteries is too strong. If this condition is not controlled, it may put you at risk for serious complications.  Your personal target blood pressure may vary depending on your medical conditions, your age, and other factors. For most people, a normal blood pressure is less than 120/80.  Hypertension is treated with  lifestyle changes, medicines, or a combination of both. Lifestyle changes include weight loss, eating a healthy,  low-sodium diet, exercising more, and limiting alcohol. This information is not intended to replace advice given to you by your health care provider. Make sure you discuss any questions you have with your health care provider. Document Released: 03/16/2005 Document Revised: 02/12/2016 Document Reviewed: 02/12/2016 Elsevier Interactive Patient Education  2018 [employer].     IF you received an x-ray today, you will receive an invoice from [facility]. Please contact [facility] at [phone] with questions or concerns regarding your invoice.   IF you received labwork today, you will receive an invoice from [facility]. Please contact [facility] at [phone] with questions or concerns regarding your invoice.   Our billing staff will not be able to assist you with questions regarding bills from these companies.  You will be contacted with the lab results as soon as they are available. The fastest way to get your results is to activate your My Chart account. Instructions are located on the last page of this paperwork. If you have not heard from [place] regarding the results in 2 weeks, please contact this office.

## 2017-06-25 LAB — LIPID PANEL
CHOLESTEROL TOTAL: 254 mg/dL — AB (ref 100–199)
Chol/HDL Ratio: 4.5 ratio (ref 0.0–5.0)
HDL: 57 mg/dL (ref >39)
LDL Calculated: 171 mg/dL — ABNORMAL HIGH (ref 0–99)
TRIGLYCERIDES: 129 mg/dL (ref 0–149)
VLDL CHOLESTEROL CAL: 26 mg/dL (ref 5–40)

## 2017-06-25 LAB — CBC
HEMATOCRIT: 45.5 % (ref 37.5–51.0)
HEMOGLOBIN: 15.1 g/dL (ref 13.0–17.7)
MCH: 33.7 pg — ABNORMAL HIGH (ref 26.6–33.0)
MCHC: 33.2 g/dL (ref 31.5–35.7)
MCV: 102 fL — ABNORMAL HIGH (ref 79–97)
Platelets: 274 10*3/uL (ref 150–379)
RBC: 4.48 x10E6/uL (ref 4.14–5.80)
RDW: 14.1 % (ref 12.3–15.4)
WBC: 5.4 10*3/uL (ref 3.4–10.8)

## 2017-06-25 LAB — COMPREHENSIVE METABOLIC PANEL
ALBUMIN: 4.7 g/dL (ref 3.6–4.8)
ALT: 20 IU/L (ref 0–44)
AST: 19 IU/L (ref 0–40)
Albumin/Globulin Ratio: 2.1 (ref 1.2–2.2)
Alkaline Phosphatase: 66 IU/L (ref 39–117)
BUN/Creatinine Ratio: 13 (ref 10–24)
BUN: 14 mg/dL (ref 8–27)
Bilirubin Total: 0.5 mg/dL (ref 0.0–1.2)
CALCIUM: 9.4 mg/dL (ref 8.6–10.2)
CO2: 25 mmol/L (ref 20–29)
Chloride: 99 mmol/L (ref 96–106)
Creatinine, Ser: 1.08 mg/dL (ref 0.76–1.27)
GFR calc Af Amer: 85 mL/min/{1.73_m2} (ref >59)
GFR calc non Af Amer: 74 mL/min/{1.73_m2} (ref >59)
GLOBULIN, TOTAL: 2.2 g/dL (ref 1.5–4.5)
Glucose: 96 mg/dL (ref 65–99)
Potassium: 4 mmol/L (ref 3.5–5.2)
SODIUM: 140 mmol/L (ref 134–144)
Total Protein: 6.9 g/dL (ref 6.0–8.5)

## 2017-06-25 LAB — HIV ANTIBODY (ROUTINE TESTING W REFLEX): HIV Screen 4th Generation wRfx: NONREACTIVE

## 2017-06-25 LAB — TSH: TSH: 2.42 u[IU]/mL (ref 0.450–4.500)

## 2017-06-25 LAB — PSA: PROSTATE SPECIFIC AG, SERUM: 0.9 ng/mL (ref 0.0–4.0)

## 2017-06-25 LAB — HEPATITIS C ANTIBODY: Hep C Virus Ab: <0.1 s/co ratio (ref 0.0–0.9)

## 2017-06-28 ENCOUNTER — Encounter: Payer: Self-pay | Admitting: Physician Assistant

## 2017-07-29 ENCOUNTER — Other Ambulatory Visit: Payer: Self-pay

## 2017-07-29 ENCOUNTER — Encounter: Payer: Self-pay | Admitting: Urgent Care

## 2017-07-29 ENCOUNTER — Ambulatory Visit: Payer: BLUE CROSS/BLUE SHIELD | Admitting: Urgent Care

## 2017-07-29 VITALS — BP 118/72 | HR 72 | Temp 98.2°F | Ht 67.0 in | Wt 191.6 lb

## 2017-07-29 DIAGNOSIS — I10 Essential (primary) hypertension: Secondary | ICD-10-CM

## 2017-07-29 NOTE — Patient Instructions (Addendum)
Please monitor your blood pressure at home. As you continue to lose weight through healthy diet and exercise, your blood pressure may continue to improve. With blood pressure medications, it may drop too low. So if you see blood pressure readings consistently in the low 100's or lower and you experiencing fatigue, dizziness then we may need to stop your blood pressure medications. Just set up an office visit so we can figure out what we will do.    Hypertension Hypertension, commonly called high blood pressure, is when the force of blood pumping through the arteries is too strong. The arteries are the blood vessels that carry blood from the heart throughout the body. Hypertension forces the heart to work harder to pump blood and may cause arteries to become narrow or stiff. Having untreated or uncontrolled hypertension can cause heart attacks, strokes, kidney disease, and other problems. A blood pressure reading consists of a higher number over a lower number. Ideally, your blood pressure should be below 120/80. The first ("top") number is called the systolic pressure. It is a measure of the pressure in your arteries as your heart beats. The second ("bottom") number is called the diastolic pressure. It is a measure of the pressure in your arteries as the heart relaxes. What are the causes? The cause of this condition is not known. What increases the risk? Some risk factors for high blood pressure are under your control. Others are not. Factors you can change  Smoking.  Having type 2 diabetes mellitus, high cholesterol, or both.  Not getting enough exercise or physical activity.  Being overweight.  Having too much fat, sugar, calories, or salt (sodium) in your diet.  Drinking too much alcohol. Factors that are difficult or impossible to change  Having chronic kidney disease.  Having a family history of high blood pressure.  Age. Risk increases with age.  Race. You may be at higher risk  if you are African-American.  Gender. Men are at higher risk than women before age 48. After age 16, women are at higher risk than men.  Having obstructive sleep apnea.  Stress. What are the signs or symptoms? Extremely high blood pressure (hypertensive crisis) may cause:  Headache.  Anxiety.  Shortness of breath.  Nosebleed.  Nausea and vomiting.  Severe chest pain.  Jerky movements you cannot control (seizures).  How is this diagnosed? This condition is diagnosed by measuring your blood pressure while you are seated, with your arm resting on a surface. The cuff of the blood pressure monitor will be placed directly against the skin of your upper arm at the level of your heart. It should be measured at least twice using the same arm. Certain conditions can cause a difference in blood pressure between your right and left arms. Certain factors can cause blood pressure readings to be lower or higher than normal (elevated) for a short period of time:  When your blood pressure is higher when you are in a health care provider's office than when you are at home, this is called white coat hypertension. Most people with this condition do not need medicines.  When your blood pressure is higher at home than when you are in a health care provider's office, this is called masked hypertension. Most people with this condition may need medicines to control blood pressure.  If you have a high blood pressure reading during one visit or you have normal blood pressure with other risk factors:  You may be asked to return on  a different day to have your blood pressure checked again.  You may be asked to monitor your blood pressure at home for 1 week or longer.  If you are diagnosed with hypertension, you may have other blood or imaging tests to help your health care provider understand your overall risk for other conditions. How is this treated? This condition is treated by making healthy lifestyle  changes, such as eating healthy foods, exercising more, and reducing your alcohol intake. Your health care provider may prescribe medicine if lifestyle changes are not enough to get your blood pressure under control, and if:  Your systolic blood pressure is above 130.  Your diastolic blood pressure is above 80.  Your personal target blood pressure may vary depending on your medical conditions, your age, and other factors. Follow these instructions at home: Eating and drinking  Eat a diet that is high in fiber and potassium, and low in sodium, added sugar, and fat. An example eating plan is called the DASH (Dietary Approaches to Stop Hypertension) diet. To eat this way: ? Eat plenty of fresh fruits and vegetables. Try to fill half of your plate at each meal with fruits and vegetables. ? Eat whole grains, such as whole wheat pasta, brown rice, or whole grain bread. Fill about one quarter of your plate with whole grains. ? Eat or drink low-fat dairy products, such as skim milk or low-fat yogurt. ? Avoid fatty cuts of meat, processed or cured meats, and poultry with skin. Fill about one quarter of your plate with lean proteins, such as fish, chicken without skin, beans, eggs, and tofu. ? Avoid premade and processed foods. These tend to be higher in sodium, added sugar, and fat.  Reduce your daily sodium intake. Most people with hypertension should eat less than 1,500 mg of sodium a day.  Limit alcohol intake to no more than 1 drink a day for nonpregnant women and 2 drinks a day for men. One drink equals 12 oz of beer, 5 oz of wine, or 1 oz of hard liquor. Lifestyle  Work with your health care provider to maintain a healthy body weight or to lose weight. Ask what an ideal weight is for you.  Get at least 30 minutes of exercise that causes your heart to beat faster (aerobic exercise) most days of the week. Activities may include walking, swimming, or biking.  Include exercise to strengthen your  muscles (resistance exercise), such as pilates or lifting weights, as part of your weekly exercise routine. Try to do these types of exercises for 30 minutes at least 3 days a week.  Do not use any products that contain nicotine or tobacco, such as cigarettes and e-cigarettes. If you need help quitting, ask your health care provider.  Monitor your blood pressure at home as told by your health care provider.  Keep all follow-up visits as told by your health care provider. This is important. Medicines  Take over-the-counter and prescription medicines only as told by your health care provider. Follow directions carefully. Blood pressure medicines must be taken as prescribed.  Do not skip doses of blood pressure medicine. Doing this puts you at risk for problems and can make the medicine less effective.  Ask your health care provider about side effects or reactions to medicines that you should watch for. Contact a health care provider if:  You think you are having a reaction to a medicine you are taking.  You have headaches that keep coming back (recurring).  You feel dizzy.  You have swelling in your ankles.  You have trouble with your vision. Get help right away if:  You develop a severe headache or confusion.  You have unusual weakness or numbness.  You feel faint.  You have severe pain in your chest or abdomen.  You vomit repeatedly.  You have trouble breathing. Summary  Hypertension is when the force of blood pumping through your arteries is too strong. If this condition is not controlled, it may put you at risk for serious complications.  Your personal target blood pressure may vary depending on your medical conditions, your age, and other factors. For most people, a normal blood pressure is less than 120/80.  Hypertension is treated with lifestyle changes, medicines, or a combination of both. Lifestyle changes include weight loss, eating a healthy, low-sodium diet,  exercising more, and limiting alcohol. This information is not intended to replace advice given to you by your health care provider. Make sure you discuss any questions you have with your health care provider. Document Released: 03/16/2005 Document Revised: 02/12/2016 Document Reviewed: 02/12/2016 Elsevier Interactive Patient Education  [employer].

## 2017-07-29 NOTE — Progress Notes (Signed)
    MRN: [id number] DOB: [birth year]  Subjective:   Phillip Glass is a 62 y.o. male presenting for follow up on Hypertension. Currently managed with amlodipine. Patient is checking blood pressure at home, generally 060R systolic. Avoids salt in diet, is exercising and making significant lifestyle changes. Denies dizziness, chronic headache, blurred vision, chest pain, shortness of breath, heart racing, palpitations, nausea, vomiting, abdominal pain, hematuria, lower leg swelling. Denies smoking cigarettes.   Phillip Glass has a current medication list which includes the following prescription(s): adalimumab, aloe vera, amlodipine, balsalazide, folic acid, garlic, mesalamine, methotrexate, multivitamin with minerals, and one-a-day cholesterol. Also is allergic to codeine.  Phillip Glass  has a past medical history of Crohn's colitis ([place]). Also  has a past surgical history that includes Colonoscopy (01/12/2011) and Tonsillectomy.  Objective:   Vitals: BP 118/72 (BP Location: Left Arm, Patient Position: Sitting, Cuff Size: Normal)   Pulse 72   Temp 98.2 F (36.8 C) (Oral)   Ht 5' 7"  (1.702 m)   Wt 191 lb 9.6 oz (86.9 kg)   SpO2 97%   BMI 30.01 kg/m   BP Readings from Last 3 Encounters:  07/29/17 118/72  06/24/17 (!) 144/78  06/20/17 (!) 186/92    Wt Readings from Last 3 Encounters:  07/29/17 191 lb 9.6 oz (86.9 kg)  06/24/17 198 lb 9.6 oz (90.1 kg)  06/19/17 199 lb 3.2 oz (90.4 kg)    Physical Exam  Constitutional: He is oriented to person, place, and time. He appears well-developed and well-nourished.  Cardiovascular: Normal rate.  Pulmonary/Chest: Effort normal.  Neurological: He is alert and oriented to person, place, and time.  Psychiatric: He has a normal mood and affect.   Assessment and Plan :   Essential hypertension  Patient has made dramatic lifestyle changes and is doing much better.  For now we will maintain his blood pressure medications.  Counseled on signs and symptoms of  hypotension and patient will return to clinic if he needs to make changes to his blood pressure medications due to hypotension.  Patient can follow-up in 6 months to 1 year or sooner if necessary.  Okay to provide medication refills up until then.  Jaynee Eagles, PA-C [facility] [phone] 07/29/2017  8:56 AM

## 2017-08-18 ENCOUNTER — Other Ambulatory Visit: Payer: Self-pay | Admitting: Physician Assistant

## 2017-08-18 ENCOUNTER — Other Ambulatory Visit: Payer: Self-pay

## 2017-09-15 DIAGNOSIS — K5010 Crohn's disease of large intestine without complications: Secondary | ICD-10-CM | POA: Diagnosis not present

## 2017-09-16 DIAGNOSIS — H524 Presbyopia: Secondary | ICD-10-CM | POA: Diagnosis not present

## 2017-09-16 DIAGNOSIS — H5203 Hypermetropia, bilateral: Secondary | ICD-10-CM | POA: Diagnosis not present

## 2017-09-16 DIAGNOSIS — H52223 Regular astigmatism, bilateral: Secondary | ICD-10-CM | POA: Diagnosis not present

## 2017-09-22 DIAGNOSIS — M1990 Unspecified osteoarthritis, unspecified site: Secondary | ICD-10-CM | POA: Diagnosis not present

## 2017-09-22 DIAGNOSIS — K50818 Crohn's disease of both small and large intestine with other complication: Secondary | ICD-10-CM | POA: Diagnosis not present

## 2017-09-22 DIAGNOSIS — M0609 Rheumatoid arthritis without rheumatoid factor, multiple sites: Secondary | ICD-10-CM | POA: Diagnosis not present

## 2017-12-17 DIAGNOSIS — K5010 Crohn's disease of large intestine without complications: Secondary | ICD-10-CM | POA: Diagnosis not present

## 2018-01-07 DIAGNOSIS — K5010 Crohn's disease of large intestine without complications: Secondary | ICD-10-CM | POA: Diagnosis not present

## 2018-01-15 ENCOUNTER — Ambulatory Visit (INDEPENDENT_AMBULATORY_CARE_PROVIDER_SITE_OTHER): Payer: BLUE CROSS/BLUE SHIELD | Admitting: Family Medicine

## 2018-01-15 DIAGNOSIS — Z23 Encounter for immunization: Secondary | ICD-10-CM

## 2018-02-12 ENCOUNTER — Other Ambulatory Visit: Payer: Self-pay | Admitting: Urgent Care

## 2018-03-16 DIAGNOSIS — M0609 Rheumatoid arthritis without rheumatoid factor, multiple sites: Secondary | ICD-10-CM | POA: Diagnosis not present

## 2018-03-16 DIAGNOSIS — K5010 Crohn's disease of large intestine without complications: Secondary | ICD-10-CM | POA: Diagnosis not present

## 2018-03-16 DIAGNOSIS — K50818 Crohn's disease of both small and large intestine with other complication: Secondary | ICD-10-CM | POA: Diagnosis not present

## 2018-03-16 DIAGNOSIS — M1990 Unspecified osteoarthritis, unspecified site: Secondary | ICD-10-CM | POA: Diagnosis not present

## 2018-05-11 DIAGNOSIS — K5010 Crohn's disease of large intestine without complications: Secondary | ICD-10-CM | POA: Diagnosis not present

## 2018-05-21 ENCOUNTER — Other Ambulatory Visit: Payer: Self-pay

## 2018-05-21 MED ORDER — AMLODIPINE BESYLATE 5 MG PO TABS: 5.0000 mg | ORAL_TABLET | Freq: Every day | ORAL | 0 refills | Status: DC

## 2018-05-21 NOTE — Progress Notes (Signed)
Pt is making ov for physical. Gave 30 rx for amlodipine.

## 2018-06-22 ENCOUNTER — Other Ambulatory Visit: Payer: Self-pay | Admitting: Family Medicine

## 2018-06-23 NOTE — Telephone Encounter (Signed)
Courtesy refill until appointment 07/05/18.

## 2018-06-27 DIAGNOSIS — K5010 Crohn's disease of large intestine without complications: Secondary | ICD-10-CM | POA: Diagnosis not present

## 2018-06-29 ENCOUNTER — Other Ambulatory Visit: Payer: Self-pay

## 2018-06-29 DIAGNOSIS — Z0000 Encounter for general adult medical examination without abnormal findings: Secondary | ICD-10-CM

## 2018-06-29 DIAGNOSIS — I10 Essential (primary) hypertension: Secondary | ICD-10-CM

## 2018-06-29 DIAGNOSIS — Z125 Encounter for screening for malignant neoplasm of prostate: Secondary | ICD-10-CM

## 2018-07-04 ENCOUNTER — Telehealth: Payer: Self-pay | Admitting: Emergency Medicine

## 2018-07-04 NOTE — Telephone Encounter (Signed)
lvm adv patient that we are doing telemed / webx visits to call the office

## 2018-07-05 ENCOUNTER — Encounter: Payer: Self-pay | Admitting: Emergency Medicine

## 2018-07-05 ENCOUNTER — Ambulatory Visit (INDEPENDENT_AMBULATORY_CARE_PROVIDER_SITE_OTHER): Payer: BLUE CROSS/BLUE SHIELD | Admitting: Emergency Medicine

## 2018-07-05 ENCOUNTER — Other Ambulatory Visit: Payer: Self-pay

## 2018-07-05 DIAGNOSIS — K50919 Crohn's disease, unspecified, with unspecified complications: Secondary | ICD-10-CM | POA: Diagnosis not present

## 2018-07-05 DIAGNOSIS — I10 Essential (primary) hypertension: Secondary | ICD-10-CM | POA: Diagnosis not present

## 2018-07-05 MED ORDER — AMLODIPINE BESYLATE 5 MG PO TABS: 5.0000 mg | ORAL_TABLET | Freq: Every day | ORAL | 3 refills | Status: DC

## 2018-07-05 NOTE — Progress Notes (Signed)
Pt c/o is CPE. 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 No travel.

## 2018-07-05 NOTE — Progress Notes (Signed)
Virtual Visit via Video Note  I connected with Phillip Glass on 07/05/18 at  8:20 AM EDT by a video enabled telemedicine application and verified that I am speaking with the correct person using two identifiers.   I discussed the limitations of evaluation and management by telemedicine and the availability of in person appointments. The patient expressed understanding and agreed to proceed.  History of Present Illness: 63 year old male with history of Crohn's disease, first visit with me.  Stable.  No complaints or medical concerns.  Working at [employer] about 70 hours a week, very busy, increased anxiety at work.  Up-to-date with blood work.  Sees a GI doctor periodically.  No complications.  Requesting refill of amlodipine 5 mg for his blood pressure.  Blood pressure at home 118/70 with a pulse of 70 and regular, self-reported.   Observations/Objective: Blood pressure 118/70 with heart rate of 70, self-reported. Alert and oriented x3 in no apparent respiratory distress. Assessment and Plan: Well-controlled chronic medical problems.  No medical concerns identified during this visit.  Up-to-date with blood work. Follow-up in 3 to 6 months. Diagnoses and all orders for this visit:  Essential hypertension -     amLODipine (NORVASC) 5 MG tablet; Take 1 tablet (5 mg total) by mouth daily.  Crohn's disease with complication, unspecified gastrointestinal tract location [facility])     Follow Up Instructions:    I discussed the assessment and treatment plan with the patient. The patient was provided an opportunity to ask questions and all were answered. The patient agreed with the plan and demonstrated an understanding of the instructions.   The patient was advised to call back or seek an in-person evaluation if the symptoms worsen or if the condition fails to improve as anticipated.  I provided 15 minutes of non-face-to-face time during this encounter.   Horald Pollen, [place]

## 2018-09-15 DIAGNOSIS — M0609 Rheumatoid arthritis without rheumatoid factor, multiple sites: Secondary | ICD-10-CM | POA: Diagnosis not present

## 2018-09-15 DIAGNOSIS — K50818 Crohn's disease of both small and large intestine with other complication: Secondary | ICD-10-CM | POA: Diagnosis not present

## 2018-09-15 DIAGNOSIS — K5010 Crohn's disease of large intestine without complications: Secondary | ICD-10-CM | POA: Diagnosis not present

## 2018-09-15 DIAGNOSIS — M1990 Unspecified osteoarthritis, unspecified site: Secondary | ICD-10-CM | POA: Diagnosis not present

## 2018-11-03 DIAGNOSIS — K5010 Crohn's disease of large intestine without complications: Secondary | ICD-10-CM | POA: Diagnosis not present

## 2018-11-17 DIAGNOSIS — H524 Presbyopia: Secondary | ICD-10-CM | POA: Diagnosis not present

## 2018-11-17 DIAGNOSIS — H5203 Hypermetropia, bilateral: Secondary | ICD-10-CM | POA: Diagnosis not present

## 2018-11-17 DIAGNOSIS — H259 Unspecified age-related cataract: Secondary | ICD-10-CM | POA: Diagnosis not present

## 2018-11-17 DIAGNOSIS — H52223 Regular astigmatism, bilateral: Secondary | ICD-10-CM | POA: Diagnosis not present

## 2018-11-19 DIAGNOSIS — H5213 Myopia, bilateral: Secondary | ICD-10-CM | POA: Diagnosis not present

## 2018-12-01 ENCOUNTER — Other Ambulatory Visit: Payer: Self-pay

## 2018-12-01 ENCOUNTER — Ambulatory Visit: Payer: BC Managed Care – PPO | Admitting: Family Medicine

## 2018-12-01 ENCOUNTER — Encounter: Payer: Self-pay | Admitting: Family Medicine

## 2018-12-01 VITALS — BP 132/83 | HR 76 | Temp 98.6°F | Resp 17 | Ht 67.0 in | Wt 199.2 lb

## 2018-12-01 DIAGNOSIS — Z23 Encounter for immunization: Secondary | ICD-10-CM | POA: Diagnosis not present

## 2018-12-01 DIAGNOSIS — I10 Essential (primary) hypertension: Secondary | ICD-10-CM | POA: Diagnosis not present

## 2018-12-01 DIAGNOSIS — L237 Allergic contact dermatitis due to plants, except food: Secondary | ICD-10-CM

## 2018-12-01 MED ORDER — TRIAMCINOLONE ACETONIDE 0.1 % EX CREA: 1.0000 "application " | TOPICAL_CREAM | Freq: Two times a day (BID) | CUTANEOUS | 0 refills | Status: DC

## 2018-12-01 MED ORDER — AMLODIPINE BESYLATE 5 MG PO TABS: 5.0000 mg | ORAL_TABLET | Freq: Every day | ORAL | 3 refills | Status: DC

## 2018-12-01 MED ORDER — PREDNISONE 20 MG PO TABS: ORAL_TABLET | ORAL | 0 refills | Status: DC

## 2018-12-01 NOTE — Patient Instructions (Addendum)
  Take prednisone 20 mg 3 pills daily for 3 days after eating.  Use triamcinolone cream 2 or 3 times a day on the rash  Continue current blood pressure medication.  Amlodipine was refilled.  Return if problems.  If you have lab work done today you will be contacted with your lab results within the next 2 weeks.  If you have not heard from [place] then please contact us. The fastest way to get your results is to register for My Chart.   IF you received an x-ray today, you will receive an invoice from [facility]. Please contact [facility] at [phone] with questions or concerns regarding your invoice.   IF you received labwork today, you will receive an invoice from [place]. Please contact [facility] at [phone] with questions or concerns regarding your invoice.   Our billing staff will not be able to assist you with questions regarding bills from these companies.  You will be contacted with the lab results as soon as they are available. The fastest way to get your results is to activate your My Chart account. Instructions are located on the last page of this paperwork. If you have not heard from [place] regarding the results in 2 weeks, please contact this office.

## 2018-12-01 NOTE — Progress Notes (Signed)
Patient ID: EL PILE, male    DOB: 02/04/1956  Age: 63 y.o. MRN: [id number]  Chief Complaint  Patient presents with  . Poison Ivy    per pt doing yardwork on Saturday and came in contact with it but didn't show up until Monday.  Last night very restless due to the itching.  Irritated w/ redness and swelling.  Location: arms and legs.  Using cortisone 10 for itchiness, benadryl last night and cfleans with cold water frequently  . Medication Refill    amlodipine    Subjective:   63 year old man who was doing yard work Saturday.  This is Thursday.  On Monday he developed a rash on his wrists and ankles.  He had tried to protect himself and washed well afterwards.  It is been a long time since he has had poison ivy.  He needs a refill on his amlodipine for his blood pressure.  No major problems with that.  No chest pains or shortness of breath.  Current allergies, medications, problem list, past/family and social histories reviewed.  Objective:  BP 132/83 (BP Location: Left Arm, Patient Position: Sitting, Cuff Size: Large)   Pulse 76   Temp 98.6 F (37 C) (Oral)   Resp 17   Ht 5' 7"  (1.702 m)   Wt 199 lb 3.2 oz (90.4 kg)   SpO2 97%   BMI 31.20 kg/m   No major acute distress.  He has scattered punctate areas of contact dermatitis appearance on his wrists and ankles.  Some of it has linear excoriation consistent with contact dermatitis.  Assessment & Plan:   Assessment: 1. Contact dermatitis due to poison ivy   2. Need for prophylactic vaccination and inoculation against influenza   3. Essential hypertension       Plan: See instructions  No orders of the defined types were placed in this encounter.   Meds ordered this encounter  Medications  . amLODipine (NORVASC) 5 MG tablet    Sig: Take 1 tablet (5 mg total) by mouth daily.    Dispense:  90 tablet    Refill:  3    MUST KEEP 07/05/18 APPOINTMENT FOR ADDITIONAL REFILLS.  Marland Kitchen triamcinolone cream (KENALOG) 0.1 %   Sig: Apply 1 application topically 2 (two) times daily.    Dispense:  30 g    Refill:  0  . predniSONE (DELTASONE) 20 MG tablet    Sig: Take 3 each morning for 3 days for poison ivy    Dispense:  9 tablet    Refill:  0         Patient Instructions    Take prednisone 20 mg 3 pills daily for 3 days after eating.  Use triamcinolone cream 2 or 3 times a day on the rash  Continue current blood pressure medication.  Amlodipine was refilled.  Return if problems.  If you have lab work done today you will be contacted with your lab results within the next 2 weeks.  If you have not heard from [place] then please contact us. The fastest way to get your results is to register for My Chart.   IF you received an x-ray today, you will receive an invoice from [facility]. Please contact [facility] at [phone] with questions or concerns regarding your invoice.   IF you received labwork today, you will receive an invoice from [place]. Please contact [facility] at [phone] with questions or concerns regarding your invoice.   Our billing staff will not be able  to assist you with questions regarding bills from these companies.  You will be contacted with the lab results as soon as they are available. The fastest way to get your results is to activate your My Chart account. Instructions are located on the last page of this paperwork. If you have not heard from [place] regarding the results in 2 weeks, please contact this office.        Return in about 6 months (around 05/31/2019) for hypertension.   Ruben Reason, MD 12/01/2018

## 2018-12-06 ENCOUNTER — Ambulatory Visit: Payer: BC Managed Care – PPO | Admitting: Family Medicine

## 2018-12-06 ENCOUNTER — Encounter: Payer: Self-pay | Admitting: Family Medicine

## 2018-12-06 ENCOUNTER — Ambulatory Visit (INDEPENDENT_AMBULATORY_CARE_PROVIDER_SITE_OTHER): Payer: BC Managed Care – PPO

## 2018-12-06 ENCOUNTER — Other Ambulatory Visit: Payer: Self-pay

## 2018-12-06 VITALS — BP 140/80 | HR 78 | Temp 98.4°F | Resp 14 | Wt 202.0 lb

## 2018-12-06 DIAGNOSIS — M25531 Pain in right wrist: Secondary | ICD-10-CM | POA: Diagnosis not present

## 2018-12-06 DIAGNOSIS — M778 Other enthesopathies, not elsewhere classified: Secondary | ICD-10-CM

## 2018-12-06 MED ORDER — DICLOFENAC SODIUM 75 MG PO TBEC: 75.0000 mg | DELAYED_RELEASE_TABLET | Freq: Two times a day (BID) | ORAL | 0 refills | Status: DC

## 2018-12-06 NOTE — Progress Notes (Signed)
Patient ID: Phillip Glass, male    DOB: [birth year]  Age: 63 y.o. MRN: [id number]  Chief Complaint  Patient presents with  . Wrist Pain    injury to right wrist. Picked up something wrong way    Subjective:   63 year old man who did some lifting of boxes at work and he thinks that maybe when he irritated his hand.  He did some other things at home also.  He has been having a lot of pain in the dorsum of the right wrist.  No single specific injury.  He is wearing a wrist splint today that he got at the pharmacy.  The poison ivy also on for couple weeks ago cleared up completely.  Current allergies, medications, problem list, past/family and social histories reviewed.  Objective:  BP 140/80 (BP Location: Right Arm, Patient Position: Sitting, Cuff Size: Normal)   Pulse 78   Temp 98.4 F (36.9 C) (Oral)   Resp 14   Wt 202 lb (91.6 kg)   SpO2 96%   BMI 31.64 kg/m   No major acute distress.  He is a long linear vein up the back of his hand from the second third finger space on of the wrist.  Actually is an exceptionally straight vein, and he felt like the pain was along it.  The pain is probably just deep to it along the wrist area.  Range of motion and strength are good.  Assessment & Plan:   Assessment: 1. Tendonitis of wrist, right   2. Pain, wrist, right       Plan: Wrist pain probably from overuse injury.  Will check an x-ray.  Treat with diclofenac.  Return if further problems.  He does not have any major stomach issue and last renal functions a year ago were fine.  The radiologist interprets the wrist x-ray is negative.  Orders Placed This Encounter  Procedures  . DG Wrist Complete Right    Standing Status:   Future    Number of Occurrences:   1    Standing Expiration Date:   12/06/2019    Order Specific Question:   Reason for Exam (SYMPTOM  OR DIAGNOSIS REQUIRED)    Answer:   wrist pain, s/p fall (follow-up)    Order Specific Question:   Preferred imaging  location?    Answer:   External    No orders of the defined types were placed in this encounter.        Patient Instructions     The wrist x-ray is normal.  Continue to wear your wrist splint until it is doing well for for 5 days.  The splint serves in large part to remind you not to do things that irritate or strain the wrist.  Take diclofenac 75 mg 1 twice daily for pain and inflammation.  (Do not take ibuprofen or Aleve while you are taking this)  If additional pain relief is needed, you can take Tylenol 500 mg 2 pills 3 times daily  Apply ice to the wrist several times a day may give some relief of the inflammation.  Avoid doing lifting and straining with that wrist.  Return as needed.  If you keep having a lot of trouble we might need to send you to a sports medicine doctor.   Tendinitis  Tendinitis is inflammation of a tendon. A tendon is a strong cord of tissue that connects muscle to bone. Tendinitis can affect any tendon, but it most commonly affects the:  Shoulder  tendon (rotator cuff).  Ankle tendon (Achilles tendon).  Elbow tendon (triceps tendon).  Tendons in the wrist. What are the causes? This condition may be caused by:  Overusing a tendon or muscle. This is common.  Age-related wear and tear.  Injury.  Inflammatory conditions, such as arthritis.  Certain medicines. What increases the risk? You are more likely to develop this condition if you do activities that involve the same movements over and over again (repetitive motions). What are the signs or symptoms? Symptoms of this condition may include:  Pain.  Tenderness.  Mild swelling.  Decreased range of motion. How is this diagnosed? This condition is diagnosed with a physical exam. You may also have tests, such as:  Ultrasound. This uses sound waves to make an image of the inside of your body in the affected area.  MRI. How is this treated? This condition may be treated by  resting, icing, applying pressure (compression), and raising (elevating) the affected area above the level of your heart. This is known as RICE therapy. Treatment may also include:  Medicines to help reduce inflammation or to help reduce pain.  Exercises or physical therapy to strengthen and stretch the tendon.  A brace or splint.  Surgery. This is rarely needed. Follow these instructions at home: If you have a splint or brace:  Wear the splint or brace as told by your health care provider. Remove it only as told by your health care provider.  Loosen the splint or brace if your fingers or toes tingle, become numb, or turn cold and blue.  Keep the splint or brace clean.  If the splint or brace is not waterproof: ? Do not let it get wet. ? Cover it with a watertight covering when you take a bath or shower. Managing pain, stiffness, and swelling  If directed, put ice on the affected area. ? If you have a removable splint or brace, remove it as told by your health care provider. ? Put ice in a plastic bag. ? Place a towel between your skin and the bag. ? Leave the ice on for 20 minutes, 2-3 times a day.  Move the fingers or toes of the affected limb often, if this applies. This can help to prevent stiffness and lessen swelling.  If directed, raise (elevate) the affected area above the level of your heart while you are sitting or lying down.  If directed, apply heat to the affected area before you exercise. Use the heat source that your health care provider recommends, such as a moist heat pack or a heating pad.     ? Place a towel between your skin and the heat source. ? Leave the heat on for 20-30 minutes. ? Remove the heat if your skin turns bright red. This is especially important if you are unable to feel pain, heat, or cold. You may have a greater risk of getting burned. Driving  Do not drive or use heavy machinery while taking prescription pain medicine.  Ask your health  care provider when it is safe to drive if you have a splint or brace on any part of your arm or leg. Activity  Rest the affected area as told by your health care provider.  Return to your normal activities as told by your health care provider. Ask your health care provider what activities are safe for you.  Avoid using the affected area while you are experiencing symptoms of tendinitis.  Do exercises as told by your health  care provider. General instructions  If you have a splint, do not put pressure on any part of the splint until it is fully hardened. This may take several hours.  Wear an elastic bandage or compression wrap only as told by your health care provider.  Take over-the-counter and prescription medicines only as told by your health care provider.  Keep all follow-up visits as told by your health care provider. This is important. Contact a health care provider if:  Your symptoms do not improve.  You develop new, unexplained problems, such as numbness in your hands. Summary  Tendinitis is inflammation of a tendon.  You are more likely to develop this condition if you do activities that involve the same movements over and over again.  This condition may be treated by resting, icing, applying pressure (compression), and elevating the area above the level of your heart. This is known as RICE therapy.  Avoid using the affected area while you are experiencing symptoms of tendinitis. This information is not intended to replace advice given to you by your health care provider. Make sure you discuss any questions you have with your health care provider. Document Released: 03/13/2000 Document Revised: 09/21/2017 Document Reviewed: 08/04/2017 Elsevier Patient Education  [employer].   If you have lab work done today you will be contacted with your lab results within the next 2 weeks.  If you have not heard from [place] then please contact us. The fastest way to get your  results is to register for My Chart.   IF you received an x-ray today, you will receive an invoice from [facility]. Please contact [facility] at [phone] with questions or concerns regarding your invoice.   IF you received labwork today, you will receive an invoice from [place]. Please contact [facility] at [phone] with questions or concerns regarding your invoice.   Our billing staff will not be able to assist you with questions regarding bills from these companies.  You will be contacted with the lab results as soon as they are available. The fastest way to get your results is to activate your My Chart account. Instructions are located on the last page of this paperwork. If you have not heard from [place] regarding the results in 2 weeks, please contact this office.         Return if symptoms worsen or fail to improve.   Ruben Reason, MD 12/06/2018

## 2018-12-06 NOTE — Patient Instructions (Addendum)
The wrist x-ray is normal.  Continue to wear your wrist splint until it is doing well for for 5 days.  The splint serves in large part to remind you not to do things that irritate or strain the wrist.  Take diclofenac 75 mg 1 twice daily for pain and inflammation.  (Do not take ibuprofen or Aleve while you are taking this)  If additional pain relief is needed, you can take Tylenol 500 mg 2 pills 3 times daily  Apply ice to the wrist several times a day may give some relief of the inflammation.  Avoid doing lifting and straining with that wrist.  Return as needed.  If you keep having a lot of trouble we might need to send you to a sports medicine doctor.   Tendinitis  Tendinitis is inflammation of a tendon. A tendon is a strong cord of tissue that connects muscle to bone. Tendinitis can affect any tendon, but it most commonly affects the:  Shoulder tendon (rotator cuff).  Ankle tendon (Achilles tendon).  Elbow tendon (triceps tendon).  Tendons in the wrist. What are the causes? This condition may be caused by:  Overusing a tendon or muscle. This is common.  Age-related wear and tear.  Injury.  Inflammatory conditions, such as arthritis.  Certain medicines. What increases the risk? You are more likely to develop this condition if you do activities that involve the same movements over and over again (repetitive motions). What are the signs or symptoms? Symptoms of this condition may include:  Pain.  Tenderness.  Mild swelling.  Decreased range of motion. How is this diagnosed? This condition is diagnosed with a physical exam. You may also have tests, such as:  Ultrasound. This uses sound waves to make an image of the inside of your body in the affected area.  MRI. How is this treated? This condition may be treated by resting, icing, applying pressure (compression), and raising (elevating) the affected area above the level of your heart. This is known as RICE  therapy. Treatment may also include:  Medicines to help reduce inflammation or to help reduce pain.  Exercises or physical therapy to strengthen and stretch the tendon.  A brace or splint.  Surgery. This is rarely needed. Follow these instructions at home: If you have a splint or brace:  Wear the splint or brace as told by your health care provider. Remove it only as told by your health care provider.  Loosen the splint or brace if your fingers or toes tingle, become numb, or turn cold and blue.  Keep the splint or brace clean.  If the splint or brace is not waterproof: ? Do not let it get wet. ? Cover it with a watertight covering when you take a bath or shower. Managing pain, stiffness, and swelling  If directed, put ice on the affected area. ? If you have a removable splint or brace, remove it as told by your health care provider. ? Put ice in a plastic bag. ? Place a towel between your skin and the bag. ? Leave the ice on for 20 minutes, 2-3 times a day.  Move the fingers or toes of the affected limb often, if this applies. This can help to prevent stiffness and lessen swelling.  If directed, raise (elevate) the affected area above the level of your heart while you are sitting or lying down.  If directed, apply heat to the affected area before you exercise. Use the heat source that your  health care provider recommends, such as a moist heat pack or a heating pad.     ? Place a towel between your skin and the heat source. ? Leave the heat on for 20-30 minutes. ? Remove the heat if your skin turns bright red. This is especially important if you are unable to feel pain, heat, or cold. You may have a greater risk of getting burned. Driving  Do not drive or use heavy machinery while taking prescription pain medicine.  Ask your health care provider when it is safe to drive if you have a splint or brace on any part of your arm or leg. Activity  Rest the affected area as  told by your health care provider.  Return to your normal activities as told by your health care provider. Ask your health care provider what activities are safe for you.  Avoid using the affected area while you are experiencing symptoms of tendinitis.  Do exercises as told by your health care provider. General instructions  If you have a splint, do not put pressure on any part of the splint until it is fully hardened. This may take several hours.  Wear an elastic bandage or compression wrap only as told by your health care provider.  Take over-the-counter and prescription medicines only as told by your health care provider.  Keep all follow-up visits as told by your health care provider. This is important. Contact a health care provider if:  Your symptoms do not improve.  You develop new, unexplained problems, such as numbness in your hands. Summary  Tendinitis is inflammation of a tendon.  You are more likely to develop this condition if you do activities that involve the same movements over and over again.  This condition may be treated by resting, icing, applying pressure (compression), and elevating the area above the level of your heart. This is known as RICE therapy.  Avoid using the affected area while you are experiencing symptoms of tendinitis. This information is not intended to replace advice given to you by your health care provider. Make sure you discuss any questions you have with your health care provider. Document Released: 03/13/2000 Document Revised: 09/21/2017 Document Reviewed: 08/04/2017 Elsevier Patient Education  [employer].   If you have lab work done today you will be contacted with your lab results within the next 2 weeks.  If you have not heard from [place] then please contact us. The fastest way to get your results is to register for My Chart.   IF you received an x-ray today, you will receive an invoice from [facility]. Please contact  [facility] Radiology at [phone] with questions or concerns regarding your invoice.   IF you received labwork today, you will receive an invoice from [place]. Please contact [facility] at [phone] with questions or concerns regarding your invoice.   Our billing staff will not be able to assist you with questions regarding bills from these companies.  You will be contacted with the lab results as soon as they are available. The fastest way to get your results is to activate your My Chart account. Instructions are located on the last page of this paperwork. If you have not heard from [place] regarding the results in 2 weeks, please contact this office.

## 2018-12-21 DIAGNOSIS — K5010 Crohn's disease of large intestine without complications: Secondary | ICD-10-CM | POA: Diagnosis not present

## 2018-12-22 ENCOUNTER — Encounter: Payer: Self-pay | Admitting: Family Medicine

## 2018-12-22 ENCOUNTER — Ambulatory Visit: Payer: BC Managed Care – PPO | Admitting: Family Medicine

## 2018-12-22 ENCOUNTER — Other Ambulatory Visit: Payer: Self-pay

## 2018-12-22 VITALS — BP 135/73 | HR 78 | Temp 98.9°F | Wt 204.2 lb

## 2018-12-22 DIAGNOSIS — E785 Hyperlipidemia, unspecified: Secondary | ICD-10-CM | POA: Diagnosis not present

## 2018-12-22 DIAGNOSIS — Z23 Encounter for immunization: Secondary | ICD-10-CM

## 2018-12-22 DIAGNOSIS — Z1329 Encounter for screening for other suspected endocrine disorder: Secondary | ICD-10-CM | POA: Diagnosis not present

## 2018-12-22 DIAGNOSIS — R635 Abnormal weight gain: Secondary | ICD-10-CM

## 2018-12-22 DIAGNOSIS — I10 Essential (primary) hypertension: Secondary | ICD-10-CM

## 2018-12-22 NOTE — Progress Notes (Signed)
Subjective:    Patient ID: Phillip Glass, male    DOB: May 10, 1955, 63 y.o.   MRN: [id number]  HPI AVONTE SENSABAUGH is a 63 y.o. male Presents today for: Chief Complaint  Patient presents with  . Establish Care    est care with DR Carlota Raspberry. Concern about his weight and would like to talk about losing weight . Have been working [employer] but have not been losing too much weight   Previous patient of mine, seen by other providers more recently, but I last saw him about 3 years ago.  History of Crohn's colitis, hypertension.  Concern regarding weight loss: Been trying exercise, difficulty with losing weight.  Gastro- Dr. Nyoka Cowden at [facility].  On Humira, mesalamine, methotrexate, no recent prednisone.  Trying to lose weight.  More weightlifting since gym has reopened. 3 times per week.  Treadmill 5 days per week since March.  No sweet tea/sodas. Rare beer - 2 per week.  Breakfast daily.  Prepares own food usually, only rare restaurant food.  Wt Readings from Last 3 Encounters:  12/22/18 204 lb 3.2 oz (92.6 kg)  12/06/18 202 lb (91.6 kg)  12/01/18 199 lb 3.2 oz (90.4 kg)   Hypertension: BP Readings from Last 3 Encounters:  12/22/18 135/73  12/06/18 140/80  12/01/18 132/83   Lab Results  Component Value Date   CREATININE 1.08 06/24/2017   Had bloodwork for GI done yesterday - creat 1.04. normal LFT's.  Amlodipine 84m qd.  Home reading 138/78.  No new side effects of meds.   Hyperlipidemia:  Lab Results  Component Value Date   CHOL 254 (H) 06/24/2017   HDL 57 06/24/2017   LDLCALC 171 (H) 06/24/2017   TRIG 129 06/24/2017   CHOLHDL 4.5 06/24/2017   Lab Results  Component Value Date   ALT 20 06/24/2017   AST 19 06/24/2017   ALKPHOS 66 06/24/2017   BILITOT 0.5 06/24/2017  not on statin. Does not want to be on statin - brother had joint pains, insomnia. Wants to try natural approach.    The 10-year ASCVD risk score (Mikey BussingDC JBrooke Bonito, et al., 2013) is: 14.3%   Values used to  calculate the score:     Age: [age]years     Sex: Male     Is Non-Hispanic African American: No     Diabetic: No     Tobacco smoker: No     Systolic Blood Pressure: 1387mmHg     Is BP treated: Yes     HDL Cholesterol: 57 mg/dL     Total Cholesterol: 254 mg/dL     Patient Active Problem List   Diagnosis Date Noted  . Polyarthralgia 01/18/2014  . Colitis 04/08/2012  . Multiple nevi 03/03/2012   Past Medical History:  Diagnosis Date  . Crohn's colitis ([facility]    Past Surgical History:  Procedure Laterality Date  . COLONOSCOPY  01/12/2011  . TONSILLECTOMY     Allergies  Allergen Reactions  . Codeine Other (See Comments)    Pt states he hallucinates   Prior to Admission medications   Medication Sig Start Date End Date Taking? Authorizing Provider  Adalimumab (HUMIRA PEN) 40 MG/0.8ML PNKT Inject 40 mg into the skin. 05/22/15  Yes [provider]  Aloe Vera 500 MG CAPS Take 1 capsule by mouth daily.   Yes [provider]  amLODipine (NORVASC) 5 MG tablet Take 1 tablet (5 mg total) by mouth daily. 12/01/18 03/01/19 Yes HPosey Boyer MD  diclofenac (VOLTAREN) 75 MG EC tablet Take 1 tablet (75 mg total) by mouth 2 (two) times daily. 12/06/18  Yes [name], MD  FOLIC ACID PO Take by mouth daily.   Yes [provider]  Garlic 428 MG TABS Take 1 tablet by mouth daily.   Yes [provider]  mesalamine (CANASA) 1000 MG suppository Place 1,000 mg rectally at bedtime.   Yes [provider]  methotrexate (RHEUMATREX) 10 MG tablet Take 50 mg by mouth once a week. Caution: Chemotherapy. Protect from light.    Yes [provider]  Multiple Vitamins-Minerals (MULTIVITAMIN WITH MINERALS) tablet Take 1 tablet by mouth daily.   Yes [provider]  Specialty Vitamins Products (ONE-A-DAY CHOLESTEROL) TABS Take 1 tablet by mouth daily.   Yes [provider]   Social History   Socioeconomic History  . Marital status:  Legally Separated    Spouse name: Not on file  . Number of children: Not on file  . Years of education: Not on file  . Highest education level: Not on file  Occupational History  . Not on file  Social Needs  . Financial resource strain: Not on file  . Food insecurity    Worry: Not on file    Inability: Not on file  . Transportation needs    Medical: Not on file    Non-medical: Not on file  Tobacco Use  . Smoking status: Never Smoker  . Smokeless tobacco: Never Used  Substance and Sexual Activity  . Alcohol use: No    Alcohol/week: 0.0 standard drinks  . Drug use: No  . Sexual activity: Never    Birth control/protection: Abstinence  Lifestyle  . Physical activity    Days per week: Not on file    Minutes per session: Not on file  . Stress: Not on file  Relationships  . Social Herbalist on phone: Not on file    Gets together: Not on file    Attends religious service: Not on file    Active member of club or organization: Not on file    Attends meetings of clubs or organizations: Not on file    Relationship status: Not on file  . Intimate partner violence    Fear of current or ex partner: Not on file    Emotionally abused: Not on file    Physically abused: Not on file    Forced sexual activity: Not on file  Other Topics Concern  . Not on file  Social History Narrative   Marital status: divorced in 2013.  Not dating.  Married x 20 years.      Children:  1 child (22); no grandchild.      Lives: with son.      Employment: Scientist, clinical (histocompatibility and immunogenetics) x 35 years; happy.      Tobacco; none      Alcohol: none      Drugs; none      Exercise:  6 days per week for 45 minutes.  Cardio twice weekly; weightlifting 5 days per week.      Seatbelt: 100%      Guns:  Locked and loaded.    Review of Systems  Constitutional: Negative for fatigue and unexpected weight change.  Eyes: Negative for visual disturbance.  Respiratory: Negative for cough, chest tightness and shortness of  breath.   Cardiovascular: Negative for chest pain, palpitations and leg swelling.  Gastrointestinal: Negative for abdominal pain and blood in stool.  Neurological: Negative for dizziness, light-headedness and headaches.        Objective:   Physical Exam Vitals signs reviewed.  Constitutional:      Appearance: He is well-developed.  HENT:     Head: Normocephalic and atraumatic.  Eyes:     Pupils: Pupils are equal, round, and reactive to light.  Neck:     Vascular: No carotid bruit or JVD.  Cardiovascular:     Rate and Rhythm: Normal rate and regular rhythm.     Heart sounds: Normal heart sounds. No murmur.  Pulmonary:     Effort: Pulmonary effort is normal.     Breath sounds: Normal breath sounds. No rales.  Skin:    General: Skin is warm and dry.  Neurological:     Mental Status: He is alert and oriented to person, place, and time.    Vitals:   12/22/18 1528  BP: 135/73  Pulse: 78  Temp: 98.9 F (37.2 C)  TempSrc: Oral  SpO2: 97%  Weight: 204 lb 3.2 oz (92.6 kg)       Assessment & Plan:  KINSTON MAGNAN is a 63 y.o. male Weight gain - Plan: TSH Screening for thyroid disorder - Plan: TSH  -Check TSH.  Some of the weight gain may be muscle mass with weight lifting.  Recheck 4 weeks.  Need for prophylactic vaccination and inoculation against influenza - Plan: Flu Vaccine QUAD 6+ mos PF IM (Fluarix Quad PF)  Hyperlipidemia, unspecified hyperlipidemia type - Plan: Lipid panel  -Repeat levels to determine statin recommendation but declines statin at this time given his brother's side effect/reaction.  Can discuss recommendations after updated labs.  Continue exercise, and diet approach.  Essential hypertension  -Stable on current regimen, no changes.  Recent lab work through [facility] reviewed including creatinine.   No orders of the defined types were placed in this encounter.  Patient Instructions       If you have lab work done today you will  be contacted with your lab results within the next 2 weeks.  If you have not heard from [place] then please contact us. The fastest way to get your results is to register for My Chart.   IF you received an x-ray today, you will receive an invoice from [facility] Radiology. Please contact [facility] Radiology at [phone] with questions or concerns regarding your invoice.   IF you received labwork today, you will receive an invoice from [place]. Please contact [facility] at [phone] with questions or concerns regarding your invoice.   Our billing staff will not be able to assist you with questions regarding bills from these companies.  You will be contacted with the lab results as soon as they are available. The fastest way to get your results is to activate your My Chart account. Instructions are located on the last page of this paperwork. If you have not heard from [place] regarding the results in 2 weeks, please contact this office.

## 2018-12-22 NOTE — Patient Instructions (Signed)
° ° ° °  If you have lab work done today you will be contacted with your lab results within the next 2 weeks.  If you have not heard from us then please contact us. The fastest way to get your results is to register for My Chart. ° ° °IF you received an x-ray today, you will receive an invoice from [facility]. Please contact [facility] at [phone] with questions or concerns regarding your invoice.  ° °IF you received labwork today, you will receive an invoice from [facility]. Please contact [facility] at [phone] with questions or concerns regarding your invoice.  ° °Our billing staff will not be able to assist you with questions regarding bills from these companies. ° °You will be contacted with the lab results as soon as they are available. The fastest way to get your results is to activate your My Chart account. Instructions are located on the last page of this paperwork. If you have not heard from us regarding the results in 2 weeks, please contact this office. °  ° ° ° °

## 2018-12-23 LAB — LIPID PANEL
Chol/HDL Ratio: 4.4 ratio (ref 0.0–5.0)
Cholesterol, Total: 258 mg/dL — ABNORMAL HIGH (ref 100–199)
HDL: 59 mg/dL (ref >39)
LDL Chol Calc (NIH): 158 mg/dL — ABNORMAL HIGH (ref 0–99)
Triglycerides: 226 mg/dL — ABNORMAL HIGH (ref 0–149)
VLDL Cholesterol Cal: 41 mg/dL — ABNORMAL HIGH (ref 5–40)

## 2018-12-23 LAB — TSH: TSH: 2.26 u[IU]/mL (ref 0.450–4.500)

## 2018-12-29 ENCOUNTER — Encounter: Payer: Self-pay | Admitting: Radiology

## 2019-01-26 ENCOUNTER — Ambulatory Visit: Payer: BC Managed Care – PPO | Admitting: Family Medicine

## 2019-01-26 ENCOUNTER — Other Ambulatory Visit: Payer: Self-pay

## 2019-01-26 ENCOUNTER — Encounter: Payer: Self-pay | Admitting: Family Medicine

## 2019-01-26 VITALS — BP 137/76 | HR 91 | Temp 98.4°F | Wt 202.6 lb

## 2019-01-26 DIAGNOSIS — E785 Hyperlipidemia, unspecified: Secondary | ICD-10-CM | POA: Diagnosis not present

## 2019-01-26 DIAGNOSIS — R635 Abnormal weight gain: Secondary | ICD-10-CM | POA: Diagnosis not present

## 2019-01-26 NOTE — Progress Notes (Signed)
Subjective:    Patient ID: Phillip Glass, male    DOB: Oct 24, 1955, 63 y.o.   MRN: [id number]  HPI Phillip Glass is a 63 y.o. male Presents today for: Chief Complaint  Patient presents with  . Weight Gain    Here for f/u weight gain/lab. still lossing 1-2 lb a week.   Difficulty with weight loss: See last visit.  History of Crohn's colitis, hypertension.  Has been trying exercise but difficulty with losing weight.  Had been 3 times per week with weight lifting since June 100 open, treadmill 5 days/week since March. Weight had increased from 199 September 3 20204 on September 24.  Did discuss potential weight gain with muscle mass from weightlifting. TSH was normal last visit.  Wt Readings from Last 3 Encounters:  01/26/19 202 lb 9.6 oz (91.9 kg)  12/22/18 204 lb 3.2 oz (92.6 kg)  12/06/18 202 lb (91.6 kg)   Weight has now started to decrease on his home readings. Has been usually exercising daily.  On [employer] workout program. Some weightlifting.  Jiu Jitsu.  Riding bike now. Rode 34m last week.  Feels better about weight heading the right direction.  No CP/DOE.   Hyperlipidemia was discussed last visit but declined statin given reaction from brothers use of statin.  Exercise and diet approach discussed. Plans on 3 month recheck.   Results for orders placed or performed in visit on 12/22/18  TSH  Result Value Ref Range   TSH 2.260 0.450 - 4.500 uIU/mL  Lipid panel  Result Value Ref Range   Cholesterol, Total 258 (H) 100 - 199 mg/dL   Triglycerides 226 (H) 0 - 149 mg/dL   HDL 59 >39 mg/dL   VLDL Cholesterol Cal 41 (H) 5 - 40 mg/dL   LDL Chol Calc (NIH) 158 (H) 0 - 99 mg/dL   Chol/HDL Ratio 4.4 0.0 - 5.0 ratio  The 10-year ASCVD risk score (Mikey BussingDC Jr., et al., 2013) is: 14.5%   Values used to calculate the score:     Age: [age]years     Sex: Male     Is Non-Hispanic African American: No     Diabetic: No     Tobacco smoker: No     Systolic Blood Pressure: 1824 mmHg     Is BP treated: Yes     HDL Cholesterol: 59 mg/dL     Total Cholesterol: 258 mg/dL    Patient Active Problem List   Diagnosis Date Noted  . Polyarthralgia 01/18/2014  . Colitis 04/08/2012  . Multiple nevi 03/03/2012   Past Medical History:  Diagnosis Date  . Crohn's colitis ([facility]    Past Surgical History:  Procedure Laterality Date  . COLONOSCOPY  01/12/2011  . TONSILLECTOMY     Allergies  Allergen Reactions  . Codeine Other (See Comments)    Pt states he hallucinates   Prior to Admission medications   Medication Sig Start Date End Date Taking? Authorizing Provider  Adalimumab (HUMIRA PEN) 40 MG/0.8ML PNKT Inject 40 mg into the skin. 05/22/15  Yes [provider]  Aloe Vera 500 MG CAPS Take 1 capsule by mouth daily.   Yes [provider]  amLODipine (NORVASC) 5 MG tablet Take 1 tablet (5 mg total) by mouth daily. 12/01/18 03/01/19 Yes HPosey Boyer MD  FOLIC ACID PO Take by mouth daily.   Yes [provider]  Garlic 1235MG TABS Take 1 tablet by mouth daily.   Yes [provider]  mesalamine (CANASA) 1000 MG suppository Place 1,000 mg rectally at bedtime.   Yes [provider]  methotrexate (RHEUMATREX) 10 MG tablet Take 50 mg by mouth once a week. Caution: Chemotherapy. Protect from light.    Yes [provider]  Multiple Vitamins-Minerals (MULTIVITAMIN WITH MINERALS) tablet Take 1 tablet by mouth daily.   Yes [provider]  Specialty Vitamins Products (ONE-A-DAY CHOLESTEROL) TABS Take 1 tablet by mouth daily.   Yes [provider]   Social History   Socioeconomic History  . Marital status: Legally Separated    Spouse name: Not on file  . Number of children: Not on file  . Years of education: Not on file  . Highest education level: Not on file  Occupational History  . Not on file  Social Needs  . Financial resource strain: Not on file  . Food insecurity    Worry: Not on file     Inability: Not on file  . Transportation needs    Medical: Not on file    Non-medical: Not on file  Tobacco Use  . Smoking status: Never Smoker  . Smokeless tobacco: Never Used  Substance and Sexual Activity  . Alcohol use: No    Alcohol/week: 0.0 standard drinks  . Drug use: No  . Sexual activity: Never    Birth control/protection: Abstinence  Lifestyle  . Physical activity    Days per week: Not on file    Minutes per session: Not on file  . Stress: Not on file  Relationships  . Social Herbalist on phone: Not on file    Gets together: Not on file    Attends religious service: Not on file    Active member of club or organization: Not on file    Attends meetings of clubs or organizations: Not on file    Relationship status: Not on file  . Intimate partner violence    Fear of current or ex partner: Not on file    Emotionally abused: Not on file    Physically abused: Not on file    Forced sexual activity: Not on file  Other Topics Concern  . Not on file  Social History Narrative   Marital status: divorced in 2013.  Not dating.  Married x 20 years.      Children:  1 child (22); no grandchild.      Lives: with son.      Employment: Scientist, clinical (histocompatibility and immunogenetics) x 35 years; happy.      Tobacco; none      Alcohol: none      Drugs; none      Exercise:  6 days per week for 45 minutes.  Cardio twice weekly; weightlifting 5 days per week.      Seatbelt: 100%      Guns:  Locked and loaded.    Review of Systems Per HPI.     Objective:   Physical Exam Vitals signs reviewed.  Constitutional:      Appearance: He is well-developed.  HENT:     Head: Normocephalic and atraumatic.  Eyes:     Pupils: Pupils are equal, round, and reactive to light.  Neck:     Vascular: No carotid bruit or JVD.  Cardiovascular:     Rate and Rhythm: Normal rate and regular rhythm.     Heart sounds: Normal heart sounds. No murmur.  Pulmonary:     Effort: Pulmonary effort is normal.     Breath  sounds: Normal breath sounds. No rales.  Abdominal:     General: Abdomen is flat. Bowel sounds are normal. There is no distension.     Tenderness: There is no abdominal tenderness.  Musculoskeletal:     Right lower leg: No edema.     Left lower leg: No edema.  Skin:    General: Skin is warm and dry.  Neurological:     Mental Status: He is alert and oriented to person, place, and time.    Vitals:   01/26/19 1548  BP: 137/76  Pulse: 91  Temp: 98.4 F (36.9 C)  TempSrc: Oral  SpO2: 97%  Weight: 202 lb 9.6 oz (91.9 kg)       Assessment & Plan:  BERTEL VENARD is a 63 y.o. male Weight gain Now with some weight loss.  Continue exercise but cautioned on over exercising potential risks.  Continue to watch diet.  Hyperlipidemia, unspecified hyperlipidemia type Previously discussed statin, plan to recheck levels in 3 months with current diet/exercise approach.  No orders of the defined types were placed in this encounter.  Patient Instructions     Ok to continue exercise, but avoid "overexercise". If any new pains, back off that area temporarily until improves.    Follow up in 3 months for repeat cholesterol.   If you have lab work done today you will be contacted with your lab results within the next 2 weeks.  If you have not heard from [place] then please contact us. The fastest way to get your results is to register for My Chart.   IF you received an x-ray today, you will receive an invoice from [facility]. Please contact [facility] at [phone] with questions or concerns regarding your invoice.   IF you received labwork today, you will receive an invoice from [facility]. Please contact [facility] at [phone] with questions or concerns regarding your invoice.   Our billing staff will not be able to assist you with questions regarding bills from these companies.  You will be contacted with the lab results as soon as they are available. The fastest way to  get your results is to activate your My Chart account. Instructions are located on the last page of this paperwork. If you have not heard from [place] regarding the results in 2 weeks, please contact this office.       Signed,   [name], MD [facility].  01/29/19 2:09 PM

## 2019-01-26 NOTE — Patient Instructions (Addendum)
   Ok to continue exercise, but avoid "overexercise". If any new pains, back off that area temporarily until improves.    Follow up in 3 months for repeat cholesterol.   If you have lab work done today you will be contacted with your lab results within the next 2 weeks.  If you have not heard from [place] then please contact us. The fastest way to get your results is to register for My Chart.   IF you received an x-ray today, you will receive an invoice from [facility]. Please contact [facility] at [phone] with questions or concerns regarding your invoice.   IF you received labwork today, you will receive an invoice from [facility]. Please contact [facility] at [phone] with questions or concerns regarding your invoice.   Our billing staff will not be able to assist you with questions regarding bills from these companies.  You will be contacted with the lab results as soon as they are available. The fastest way to get your results is to activate your My Chart account. Instructions are located on the last page of this paperwork. If you have not heard from [place] regarding the results in 2 weeks, please contact this office.

## 2019-01-29 ENCOUNTER — Encounter: Payer: Self-pay | Admitting: Family Medicine

## 2019-02-17 ENCOUNTER — Other Ambulatory Visit: Payer: Self-pay

## 2019-02-17 ENCOUNTER — Ambulatory Visit (INDEPENDENT_AMBULATORY_CARE_PROVIDER_SITE_OTHER): Payer: BC Managed Care – PPO

## 2019-02-17 ENCOUNTER — Encounter: Payer: Self-pay | Admitting: Family Medicine

## 2019-02-17 ENCOUNTER — Ambulatory Visit: Payer: BC Managed Care – PPO | Admitting: Family Medicine

## 2019-02-17 VITALS — BP 136/80 | HR 76 | Temp 99.0°F | Wt 203.0 lb

## 2019-02-17 DIAGNOSIS — M25561 Pain in right knee: Secondary | ICD-10-CM | POA: Diagnosis not present

## 2019-02-17 MED ORDER — MELOXICAM 7.5 MG PO TABS: 7.5000 mg | ORAL_TABLET | Freq: Every day | ORAL | 0 refills | Status: DC | PRN

## 2019-02-17 NOTE — Progress Notes (Signed)
Subjective:  Patient ID: Phillip Glass, male    DOB: [birth year]  Age: 63 y.o. MRN: [id number]  CC:  Chief Complaint  Patient presents with   Knee Pain    Right knee pain for the past 2 week. Tried a knee brace but have not helped. Pain radiate from mid outter knee shoot down to the heel of feet on the right knee    HPI Phillip GUERRETTE presents for   Right knee pain: Started on outside of knee - noticed on elliptical. No pop. Sore on outside of knee. Slight swelling.  Some giving way with bending certain way.  Wearing otc knee brace with pad.  Tx: tylenol, heat/ice.  Getting better until today. Planted and turned - sudden pain on outside of knee. Ice/heat.  Trouble with initial step but able to WB.  Not radiating to ankle/foot/thigh/back.   No prior knee surgery/injections.  Had been exercising regularly with increased bicycle riding. NKI.   No hx CAD/CHF/PUD.   History Patient Active Problem List   Diagnosis Date Noted   Polyarthralgia 01/18/2014   Colitis 04/08/2012   Multiple nevi 03/03/2012   Past Medical History:  Diagnosis Date   Crohn's colitis [facility])    Past Surgical History:  Procedure Laterality Date   COLONOSCOPY  01/12/2011   TONSILLECTOMY     Allergies  Allergen Reactions   Codeine Other (See Comments)    Pt states he hallucinates   Prior to Admission medications   Medication Sig Start Date End Date Taking? Authorizing Provider  Adalimumab (HUMIRA PEN) 40 MG/0.8ML PNKT Inject 40 mg into the skin. 05/22/15   [provider]  Aloe Vera 500 MG CAPS Take 1 capsule by mouth daily.    [provider]  amLODipine (NORVASC) 5 MG tablet Take 1 tablet (5 mg total) by mouth daily. 12/01/18 03/01/19  [name], MD  FOLIC ACID PO Take by mouth daily.    [provider]  Garlic 914 MG TABS Take 1 tablet by mouth daily.    [provider]  mesalamine (CANASA) 1000 MG suppository Place 1,000 mg rectally at bedtime.     [provider]  methotrexate (RHEUMATREX) 10 MG tablet Take 50 mg by mouth once a week. Caution: Chemotherapy. Protect from light.     [provider]  Multiple Vitamins-Minerals (MULTIVITAMIN WITH MINERALS) tablet Take 1 tablet by mouth daily.    [provider]  Specialty Vitamins Products (ONE-A-DAY CHOLESTEROL) TABS Take 1 tablet by mouth daily.    [provider]   Social History   Socioeconomic History   Marital status: Legally Separated    Spouse name: Not on file   Number of children: Not on file   Years of education: Not on file   Highest education level: Not on file  Occupational History   Not on file  Social Needs   Financial resource strain: Not on file   Food insecurity    Worry: Not on file    Inability: Not on file   Transportation needs    Medical: Not on file    Non-medical: Not on file  Tobacco Use   Smoking status: Never Smoker   Smokeless tobacco: Never Used  Substance and Sexual Activity   Alcohol use: No    Alcohol/week: 0.0 standard drinks   Drug use: No   Sexual activity: Never    Birth control/protection: Abstinence  Lifestyle   Physical activity    Days per week: Not  on file    Minutes per session: Not on file   Stress: Not on file  Relationships   Social connections    Talks on phone: Not on file    Gets together: Not on file    Attends religious service: Not on file    Active member of club or organization: Not on file    Attends meetings of clubs or organizations: Not on file    Relationship status: Not on file   Intimate partner violence    Fear of current or ex partner: Not on file    Emotionally abused: Not on file    Physically abused: Not on file    Forced sexual activity: Not on file  Other Topics Concern   Not on file  Social History Narrative   Marital status: divorced in 2013.  Not dating.  Married x 20 years.      Children:  1 child (22); no grandchild.      Lives:  with son.      Employment: Scientist, clinical (histocompatibility and immunogenetics) x 35 years; happy.      Tobacco; none      Alcohol: none      Drugs; none      Exercise:  6 days per week for 45 minutes.  Cardio twice weekly; weightlifting 5 days per week.      Seatbelt: 100%      Guns:  Locked and loaded.    Review of Systems   Objective:   Vitals:   02/17/19 1623  BP: 136/80  Pulse: 76  Temp: 99 F (37.2 C)  TempSrc: Oral  SpO2: 97%  Weight: 203 lb (92.1 kg)     Physical Exam Constitutional:      General: He is not in acute distress.    Appearance: He is well-developed.  HENT:     Head: Normocephalic and atraumatic.  Cardiovascular:     Rate and Rhythm: Normal rate.  Pulmonary:     Effort: Pulmonary effort is normal.  Musculoskeletal:     Right knee: He exhibits abnormal meniscus (pain laterally with [place]. no click). He exhibits normal range of motion (but pain at terminal flexion in lateral knee. ), no swelling, no effusion, no ecchymosis, no deformity, normal alignment, no LCL laxity, normal patellar mobility, no bony tenderness and no MCL laxity. Tenderness found. Lateral joint line tenderness noted. No medial joint line, no MCL and no LCL tenderness noted.  Neurological:     Mental Status: He is alert and oriented to person, place, and time.      Dg Knee Complete 4 Views Right  Result Date: 02/17/2019 CLINICAL DATA:  Acute right knee pain. EXAM: RIGHT KNEE - COMPLETE 4+ VIEW COMPARISON:  None. FINDINGS: No evidence of fracture, dislocation, or joint effusion. No evidence of arthropathy or other focal bone abnormality. Soft tissues are unremarkable. IMPRESSION: Normal exam. Electronically Signed   By: Lorriane Shire M.D.   On: 02/17/2019 16:44     Assessment & Plan:  [name] is a 63 y.o. male . Acute pain of right knee - Plan: DG Knee Complete 4 Views Right  -Differential includes meniscal tear.  Had been improving with relative rest.  No concerning findings on exam or x-ray.   Initial trial of hinged knee brace, meloxicam 7.5 mg daily as needed, symptomatic care, recheck next 2 weeks if not improving.  Sooner if worse.  No orders of the defined types were placed in this encounter.  Patient Instructions  Meloxicam once per day as needed for pain.  Do not combine it with other over-the-counter pain medicine such as Advil or Aleve.  Tylenol is okay to take.  Ice or heat to the affected area as needed, avoid twisting, squatting, turning.  Low intensity stationary bicycle okay if tolerated.  Recheck in 2 weeks, sooner if worsening.  Okay to use brace during that time if it feels better.  Acute Knee Pain, Adult Acute knee pain is sudden and may be caused by damage, swelling, or irritation of the muscles and tissues that support your knee. The injury may result from:  A fall.  An injury to your knee from twisting motions.  A hit to the knee.  Infection. Acute knee pain may go away on its own with time and rest. If it does not, your health care provider may order tests to find the cause of the pain. These may include:  Imaging tests, such as an X-ray, MRI, or ultrasound.  Joint aspiration. In this test, fluid is removed from the knee.  Arthroscopy. In this test, a lighted tube is inserted into the knee and an image is projected onto a TV screen.  Biopsy. In this test, a sample of tissue is removed from the body and studied under a microscope. Follow these instructions at home: Pay attention to any changes in your symptoms. Take these actions to relieve your pain. If you have a knee sleeve or brace:   Wear the sleeve or brace as told by your health care provider. Remove it only as told by your health care provider.  Loosen the sleeve or brace if your toes tingle, become numb, or turn cold and blue.  Keep the sleeve or brace clean.  If the sleeve or brace is not waterproof: ? Do not let it get wet. ? Cover it with a watertight covering when you take a bath  or shower. Activity  Rest your knee.  Do not do things that cause pain or make pain worse.  Avoid high-impact activities or exercises, such as running, jumping rope, or doing jumping jacks.  Work with a physical therapist to make a safe exercise program, as recommended by your health care provider. Do exercises as told by your physical therapist. Managing pain, stiffness, and swelling   If directed, put ice on the knee: ? Put ice in a plastic bag. ? Place a towel between your skin and the bag. ? Leave the ice on for 20 minutes, 2-3 times a day.  If directed, use an elastic bandage to put pressure (compression) on your injured knee. This may control swelling, give support, and help with discomfort. General instructions  Take over-the-counter and prescription medicines only as told by your health care provider.  Raise (elevate) your knee above the level of your heart when you are sitting or lying down.  Sleep with a pillow under your knee.  Do not use any products that contain nicotine or tobacco, such as cigarettes, e-cigarettes, and chewing tobacco. These can delay healing. If you need help quitting, ask your health care provider.  If you are overweight, work with your health care provider and a dietitian to set a weight-loss goal that is healthy and reasonable for you. Extra weight can put pressure on your knee.  Keep all follow-up visits as told by your health care provider. This is important. Contact a health care provider if:  Your knee pain continues, changes, or gets worse.  You have a fever along with  knee pain.  Your knee feels warm to the touch.  Your knee buckles or locks up. Get help right away if:  Your knee swells, and the swelling becomes worse.  You cannot move your knee.  You have severe pain in your knee. Summary  Acute knee pain can be caused by a fall, an injury, an infection, or damage, swelling, or irritation of the tissues that support your  knee.  Your health care provider may perform tests to find out the cause of the pain.  Pay attention to any changes in your symptoms. Relieve your pain with rest, medicines, light activity, and use of ice.  Get help if your pain continues or becomes worse, your knee swells, or you cannot move your knee. This information is not intended to replace advice given to you by your health care provider. Make sure you discuss any questions you have with your health care provider. Document Released: 01/11/2007 Document Revised: 08/26/2017 Document Reviewed: 08/26/2017 Elsevier Patient Education  [employer].    If you have lab work done today you will be contacted with your lab results within the next 2 weeks.  If you have not heard from [place] then please contact us. The fastest way to get your results is to register for My Chart.   IF you received an x-ray today, you will receive an invoice from [facility]. Please contact [facility] at [phone] with questions or concerns regarding your invoice.   IF you received labwork today, you will receive an invoice from [place]. Please contact [facility] at [phone] with questions or concerns regarding your invoice.   Our billing staff will not be able to assist you with questions regarding bills from these companies.  You will be contacted with the lab results as soon as they are available. The fastest way to get your results is to activate your My Chart account. Instructions are located on the last page of this paperwork. If you have not heard from [place] regarding the results in 2 weeks, please contact this office.          Signed, [name], MD [facility]

## 2019-02-17 NOTE — Patient Instructions (Addendum)
Meloxicam once per day as needed for pain.  Do not combine it with other over-the-counter pain medicine such as Advil or Aleve.  Tylenol is okay to take.  Ice or heat to the affected area as needed, avoid twisting, squatting, turning.  Low intensity stationary bicycle okay if tolerated.  Recheck in 2 weeks, sooner if worsening.  Okay to use brace during that time if it feels better.  Acute Knee Pain, Adult Acute knee pain is sudden and may be caused by damage, swelling, or irritation of the muscles and tissues that support your knee. The injury may result from:  A fall.  An injury to your knee from twisting motions.  A hit to the knee.  Infection. Acute knee pain may go away on its own with time and rest. If it does not, your health care provider may order tests to find the cause of the pain. These may include:  Imaging tests, such as an X-ray, MRI, or ultrasound.  Joint aspiration. In this test, fluid is removed from the knee.  Arthroscopy. In this test, a lighted tube is inserted into the knee and an image is projected onto a TV screen.  Biopsy. In this test, a sample of tissue is removed from the body and studied under a microscope. Follow these instructions at home: Pay attention to any changes in your symptoms. Take these actions to relieve your pain. If you have a knee sleeve or brace:   Wear the sleeve or brace as told by your health care provider. Remove it only as told by your health care provider.  Loosen the sleeve or brace if your toes tingle, become numb, or turn cold and blue.  Keep the sleeve or brace clean.  If the sleeve or brace is not waterproof: ? Do not let it get wet. ? Cover it with a watertight covering when you take a bath or shower. Activity  Rest your knee.  Do not do things that cause pain or make pain worse.  Avoid high-impact activities or exercises, such as running, jumping rope, or doing jumping jacks.  Work with a physical therapist to  make a safe exercise program, as recommended by your health care provider. Do exercises as told by your physical therapist. Managing pain, stiffness, and swelling   If directed, put ice on the knee: ? Put ice in a plastic bag. ? Place a towel between your skin and the bag. ? Leave the ice on for 20 minutes, 2-3 times a day.  If directed, use an elastic bandage to put pressure (compression) on your injured knee. This may control swelling, give support, and help with discomfort. General instructions  Take over-the-counter and prescription medicines only as told by your health care provider.  Raise (elevate) your knee above the level of your heart when you are sitting or lying down.  Sleep with a pillow under your knee.  Do not use any products that contain nicotine or tobacco, such as cigarettes, e-cigarettes, and chewing tobacco. These can delay healing. If you need help quitting, ask your health care provider.  If you are overweight, work with your health care provider and a dietitian to set a weight-loss goal that is healthy and reasonable for you. Extra weight can put pressure on your knee.  Keep all follow-up visits as told by your health care provider. This is important. Contact a health care provider if:  Your knee pain continues, changes, or gets worse.  You have a fever along with  knee pain.  Your knee feels warm to the touch.  Your knee buckles or locks up. Get help right away if:  Your knee swells, and the swelling becomes worse.  You cannot move your knee.  You have severe pain in your knee. Summary  Acute knee pain can be caused by a fall, an injury, an infection, or damage, swelling, or irritation of the tissues that support your knee.  Your health care provider may perform tests to find out the cause of the pain.  Pay attention to any changes in your symptoms. Relieve your pain with rest, medicines, light activity, and use of ice.  Get help if your pain  continues or becomes worse, your knee swells, or you cannot move your knee. This information is not intended to replace advice given to you by your health care provider. Make sure you discuss any questions you have with your health care provider. Document Released: 01/11/2007 Document Revised: 08/26/2017 Document Reviewed: 08/26/2017 Elsevier Patient Education  [employer].    If you have lab work done today you will be contacted with your lab results within the next 2 weeks.  If you have not heard from [place] then please contact us. The fastest way to get your results is to register for My Chart.   IF you received an x-ray today, you will receive an invoice from [facility]. Please contact [facility] at [phone] with questions or concerns regarding your invoice.   IF you received labwork today, you will receive an invoice from [facility]. Please contact [facility] at [phone] with questions or concerns regarding your invoice.   Our billing staff will not be able to assist you with questions regarding bills from these companies.  You will be contacted with the lab results as soon as they are available. The fastest way to get your results is to activate your My Chart account. Instructions are located on the last page of this paperwork. If you have not heard from [place] regarding the results in 2 weeks, please contact this office.

## 2019-02-19 ENCOUNTER — Encounter: Payer: Self-pay | Admitting: Family Medicine

## 2019-03-01 ENCOUNTER — Other Ambulatory Visit: Payer: Self-pay

## 2019-03-01 ENCOUNTER — Ambulatory Visit: Payer: BC Managed Care – PPO | Admitting: Family Medicine

## 2019-03-01 ENCOUNTER — Encounter: Payer: Self-pay | Admitting: Family Medicine

## 2019-03-01 ENCOUNTER — Ambulatory Visit (INDEPENDENT_AMBULATORY_CARE_PROVIDER_SITE_OTHER): Payer: BC Managed Care – PPO

## 2019-03-01 VITALS — BP 146/80 | HR 72 | Temp 98.3°F | Resp 12 | Ht 67.0 in | Wt 204.0 lb

## 2019-03-01 DIAGNOSIS — M25461 Effusion, right knee: Secondary | ICD-10-CM | POA: Diagnosis not present

## 2019-03-01 DIAGNOSIS — M25561 Pain in right knee: Secondary | ICD-10-CM | POA: Diagnosis not present

## 2019-03-01 NOTE — Patient Instructions (Signed)
Based on exam today and swelling I am still suspicious of a possible meniscus injury.  I will refer you to orthopedics.  Wear the knee brace.  Meloxicam if needed, ice and elevation as needed.  Let me know if there are questions.   Acute Knee Pain, Adult Acute knee pain is sudden and may be caused by damage, swelling, or irritation of the muscles and tissues that support your knee. The injury may result from:  A fall.  An injury to your knee from twisting motions.  A hit to the knee.  Infection. Acute knee pain may go away on its own with time and rest. If it does not, your health care provider may order tests to find the cause of the pain. These may include:  Imaging tests, such as an X-ray, MRI, or ultrasound.  Joint aspiration. In this test, fluid is removed from the knee.  Arthroscopy. In this test, a lighted tube is inserted into the knee and an image is projected onto a TV screen.  Biopsy. In this test, a sample of tissue is removed from the body and studied under a microscope. Follow these instructions at home: Pay attention to any changes in your symptoms. Take these actions to relieve your pain. If you have a knee sleeve or brace:   Wear the sleeve or brace as told by your health care provider. Remove it only as told by your health care provider.  Loosen the sleeve or brace if your toes tingle, become numb, or turn cold and blue.  Keep the sleeve or brace clean.  If the sleeve or brace is not waterproof: ? Do not let it get wet. ? Cover it with a watertight covering when you take a bath or shower. Activity  Rest your knee.  Do not do things that cause pain or make pain worse.  Avoid high-impact activities or exercises, such as running, jumping rope, or doing jumping jacks.  Work with a physical therapist to make a safe exercise program, as recommended by your health care provider. Do exercises as told by your physical therapist. Managing pain, stiffness, and  swelling   If directed, put ice on the knee: ? Put ice in a plastic bag. ? Place a towel between your skin and the bag. ? Leave the ice on for 20 minutes, 2-3 times a day.  If directed, use an elastic bandage to put pressure (compression) on your injured knee. This may control swelling, give support, and help with discomfort. General instructions  Take over-the-counter and prescription medicines only as told by your health care provider.  Raise (elevate) your knee above the level of your heart when you are sitting or lying down.  Sleep with a pillow under your knee.  Do not use any products that contain nicotine or tobacco, such as cigarettes, e-cigarettes, and chewing tobacco. These can delay healing. If you need help quitting, ask your health care provider.  If you are overweight, work with your health care provider and a dietitian to set a weight-loss goal that is healthy and reasonable for you. Extra weight can put pressure on your knee.  Keep all follow-up visits as told by your health care provider. This is important. Contact a health care provider if:  Your knee pain continues, changes, or gets worse.  You have a fever along with knee pain.  Your knee feels warm to the touch.  Your knee buckles or locks up. Get help right away if:  Your knee swells,  and the swelling becomes worse.  You cannot move your knee.  You have severe pain in your knee. Summary  Acute knee pain can be caused by a fall, an injury, an infection, or damage, swelling, or irritation of the tissues that support your knee.  Your health care provider may perform tests to find out the cause of the pain.  Pay attention to any changes in your symptoms. Relieve your pain with rest, medicines, light activity, and use of ice.  Get help if your pain continues or becomes worse, your knee swells, or you cannot move your knee. This information is not intended to replace advice given to you by your health  care provider. Make sure you discuss any questions you have with your health care provider. Document Released: 01/11/2007 Document Revised: 08/26/2017 Document Reviewed: 08/26/2017 Elsevier Patient Education  [employer].      If you have lab work done today you will be contacted with your lab results within the next 2 weeks.  If you have not heard from [place] then please contact us. The fastest way to get your results is to register for My Chart.   IF you received an x-ray today, you will receive an invoice from [facility]. Please contact [facility] at [phone] with questions or concerns regarding your invoice.   IF you received labwork today, you will receive an invoice from [place]. Please contact [facility] at [phone] with questions or concerns regarding your invoice.   Our billing staff will not be able to assist you with questions regarding bills from these companies.  You will be contacted with the lab results as soon as they are available. The fastest way to get your results is to activate your My Chart account. Instructions are located on the last page of this paperwork. If you have not heard from [place] regarding the results in 2 weeks, please contact this office.

## 2019-03-01 NOTE — Progress Notes (Signed)
Subjective:  Patient ID: Phillip Glass, male    DOB: 09-03-1955  Age: 63 y.o. MRN: [id number]  CC:  Chief Complaint  Patient presents with  . Knee Pain    Pt stated re-injured turning and twisted  the Rt knee-last night at work --Rt knee --painful, sharp pain especially when turning or  weight on it.    HPI Phillip Glass presents for   R knee pain: Right knee pain evaluated November 20.  See details of that visit.  X-ray without concerns at that time.  Hinged knee brace applied, meloxicam 7.5 mg daily as needed for possible meniscal issue. Had been getting better, improving with some residual soreness.   Noticed return of pain after turning/twisting last night. Planted foot, got caught in grout, and R knee twisted - felt sharp pain in front/outside of knee.  No swelling/bruising. Persistent pain with twisting. Has been wearing brace at work - has been wearing all day today.    Tx; ice, heat. mobic - helped some.    History Patient Active Problem List   Diagnosis Date Noted  . Polyarthralgia 01/18/2014  . Colitis 04/08/2012  . Multiple nevi 03/03/2012   Past Medical History:  Diagnosis Date  . Crohn's colitis [facility])    Past Surgical History:  Procedure Laterality Date  . COLONOSCOPY  01/12/2011  . TONSILLECTOMY     Allergies  Allergen Reactions  . Codeine Other (See Comments)    Pt states he hallucinates   Prior to Admission medications   Medication Sig Start Date End Date Taking? Authorizing Provider  Adalimumab (HUMIRA PEN) 40 MG/0.8ML PNKT Inject 40 mg into the skin. 05/22/15  Yes [provider]  Aloe Vera 500 MG CAPS Take 1 capsule by mouth daily.   Yes [provider]  amLODipine (NORVASC) 5 MG tablet Take 1 tablet (5 mg total) by mouth daily. 12/01/18 03/01/19 Yes [name], MD  FOLIC ACID PO Take by mouth daily.   Yes [provider]  Garlic 195 MG TABS Take 1 tablet by mouth daily.   Yes [provider]  meloxicam  (MOBIC) 7.5 MG tablet Take 1 tablet (7.5 mg total) by mouth daily as needed for pain. 02/17/19  Yes [name], MD  mesalamine (CANASA) 1000 MG suppository Place 1,000 mg rectally at bedtime.   Yes [provider]  methotrexate (RHEUMATREX) 10 MG tablet Take 50 mg by mouth once a week. Caution: Chemotherapy. Protect from light.    Yes [provider]  Multiple Vitamins-Minerals (MULTIVITAMIN WITH MINERALS) tablet Take 1 tablet by mouth daily.   Yes [provider]  Specialty Vitamins Products (ONE-A-DAY CHOLESTEROL) TABS Take 1 tablet by mouth daily.   Yes [provider]   Social History   Socioeconomic History  . Marital status: Legally Separated    Spouse name: Not on file  . Number of children: Not on file  . Years of education: Not on file  . Highest education level: Not on file  Occupational History  . Not on file  Social Needs  . Financial resource strain: Not on file  . Food insecurity    Worry: Not on file    Inability: Not on file  . Transportation needs    Medical: Not on file    Non-medical: Not on file  Tobacco Use  . Smoking status: Never Smoker  . Smokeless tobacco: Never Used  Substance and Sexual Activity  . Alcohol use: No  Alcohol/week: 0.0 standard drinks  . Drug use: No  . Sexual activity: Never    Birth control/protection: Abstinence  Lifestyle  . Physical activity    Days per week: Not on file    Minutes per session: Not on file  . Stress: Not on file  Relationships  . Social Herbalist on phone: Not on file    Gets together: Not on file    Attends religious service: Not on file    Active member of club or organization: Not on file    Attends meetings of clubs or organizations: Not on file    Relationship status: Not on file  . Intimate partner violence    Fear of current or ex partner: Not on file    Emotionally abused: Not on file    Physically abused: Not on file    Forced sexual  activity: Not on file  Other Topics Concern  . Not on file  Social History Narrative   Marital status: divorced in 2013.  Not dating.  Married x 20 years.      Children:  1 child (22); no grandchild.      Lives: with son.      Employment: Scientist, clinical (histocompatibility and immunogenetics) x 35 years; happy.      Tobacco; none      Alcohol: none      Drugs; none      Exercise:  6 days per week for 45 minutes.  Cardio twice weekly; weightlifting 5 days per week.      Seatbelt: 100%      Guns:  Locked and loaded.    Review of Systems Per HPI.   Objective:   Vitals:   03/01/19 1619  BP: (!) 146/80  Pulse: 72  Resp: 12  Temp: 98.3 F (36.8 C)  SpO2: 98%  Weight: 204 lb (92.5 kg)  Height: 5' 7"  (1.702 m)     Physical Exam Constitutional:      General: He is not in acute distress.    Appearance: He is well-developed.  HENT:     Head: Normocephalic and atraumatic.  Cardiovascular:     Rate and Rhythm: Normal rate.  Pulmonary:     Effort: Pulmonary effort is normal.  Musculoskeletal:     Right knee: He exhibits effusion (1+) and abnormal meniscus (Pain at lateral knee joint with McMurray, external rotation with flexion.). He exhibits normal range of motion, no ecchymosis, no deformity, no erythema, normal alignment, no LCL laxity, normal patellar mobility and no bony tenderness. No tenderness found. No medial joint line and no lateral joint line tenderness noted.  Neurological:     Mental Status: He is alert and oriented to person, place, and time.   Negative Lachman, drawer of right knee    Dg Knee Complete 4 Views Right  Dg Knee 3 Views Right  Result Date: 03/01/2019 CLINICAL DATA:  Right knee pain, worse after twisting last night. EXAM: RIGHT KNEE - 3 VIEW COMPARISON:  Right knee radiograph 02/17/2019 FINDINGS: No evidence of fracture or dislocation. Alignment joint spaces are maintained. Tiny patellar spurs. Small knee joint effusion. No evidence of arthropathy or other focal bone abnormality.  Soft tissues are unremarkable. IMPRESSION: Small knee joint effusion. No acute osseous abnormality. Electronically Signed   By: Keith Rake M.D.   On: 03/01/2019 17:44    Assessment & Plan:  Phillip Glass is a 63 y.o. male . Right knee pain, unspecified chronicity - Plan: DG Knee 3 Views  Right, Ambulatory referral to Orthopedic Surgery  Effusion of right knee - Plan: Ambulatory referral to Orthopedic Surgery Suspect possible meniscal tear, had been improving, then misstep last night with flare of symptoms.   - Continue brace, symptomatic care with ice, elevation, handout given.meloxicam cam as needed.  Declined stronger medication at this time.  Referred to orthopedics.  RTC precautions.   No orders of the defined types were placed in this encounter.  Patient Instructions    Based on exam today and swelling I am still suspicious of a possible meniscus injury.  I will refer you to orthopedics.  Wear the knee brace.  Meloxicam if needed, ice and elevation as needed.  Let me know if there are questions.   Acute Knee Pain, Adult Acute knee pain is sudden and may be caused by damage, swelling, or irritation of the muscles and tissues that support your knee. The injury may result from:  A fall.  An injury to your knee from twisting motions.  A hit to the knee.  Infection. Acute knee pain may go away on its own with time and rest. If it does not, your health care provider may order tests to find the cause of the pain. These may include:  Imaging tests, such as an X-ray, MRI, or ultrasound.  Joint aspiration. In this test, fluid is removed from the knee.  Arthroscopy. In this test, a lighted tube is inserted into the knee and an image is projected onto a TV screen.  Biopsy. In this test, a sample of tissue is removed from the body and studied under a microscope. Follow these instructions at home: Pay attention to any changes in your symptoms. Take these actions to relieve your  pain. If you have a knee sleeve or brace:   Wear the sleeve or brace as told by your health care provider. Remove it only as told by your health care provider.  Loosen the sleeve or brace if your toes tingle, become numb, or turn cold and blue.  Keep the sleeve or brace clean.  If the sleeve or brace is not waterproof: ? Do not let it get wet. ? Cover it with a watertight covering when you take a bath or shower. Activity  Rest your knee.  Do not do things that cause pain or make pain worse.  Avoid high-impact activities or exercises, such as running, jumping rope, or doing jumping jacks.  Work with a physical therapist to make a safe exercise program, as recommended by your health care provider. Do exercises as told by your physical therapist. Managing pain, stiffness, and swelling   If directed, put ice on the knee: ? Put ice in a plastic bag. ? Place a towel between your skin and the bag. ? Leave the ice on for 20 minutes, 2-3 times a day.  If directed, use an elastic bandage to put pressure (compression) on your injured knee. This may control swelling, give support, and help with discomfort. General instructions  Take over-the-counter and prescription medicines only as told by your health care provider.  Raise (elevate) your knee above the level of your heart when you are sitting or lying down.  Sleep with a pillow under your knee.  Do not use any products that contain nicotine or tobacco, such as cigarettes, e-cigarettes, and chewing tobacco. These can delay healing. If you need help quitting, ask your health care provider.  If you are overweight, work with your health care provider and a dietitian to set  a weight-loss goal that is healthy and reasonable for you. Extra weight can put pressure on your knee.  Keep all follow-up visits as told by your health care provider. This is important. Contact a health care provider if:  Your knee pain continues, changes, or gets  worse.  You have a fever along with knee pain.  Your knee feels warm to the touch.  Your knee buckles or locks up. Get help right away if:  Your knee swells, and the swelling becomes worse.  You cannot move your knee.  You have severe pain in your knee. Summary  Acute knee pain can be caused by a fall, an injury, an infection, or damage, swelling, or irritation of the tissues that support your knee.  Your health care provider may perform tests to find out the cause of the pain.  Pay attention to any changes in your symptoms. Relieve your pain with rest, medicines, light activity, and use of ice.  Get help if your pain continues or becomes worse, your knee swells, or you cannot move your knee. This information is not intended to replace advice given to you by your health care provider. Make sure you discuss any questions you have with your health care provider. Document Released: 01/11/2007 Document Revised: 08/26/2017 Document Reviewed: 08/26/2017 Elsevier Patient Education  [employer].      If you have lab work done today you will be contacted with your lab results within the next 2 weeks.  If you have not heard from [place] then please contact us. The fastest way to get your results is to register for My Chart.   IF you received an x-ray today, you will receive an invoice from [facility]. Please contact [facility] at [phone] with questions or concerns regarding your invoice.   IF you received labwork today, you will receive an invoice from [facility]. Please contact [facility] at [phone] with questions or concerns regarding your invoice.   Our billing staff will not be able to assist you with questions regarding bills from these companies.  You will be contacted with the lab results as soon as they are available. The fastest way to get your results is to activate your My Chart account. Instructions are located on the last page of this paperwork.  If you have not heard from [place] regarding the results in 2 weeks, please contact this office.          Signed, [name], MD [facility]

## 2019-03-02 ENCOUNTER — Encounter: Payer: Self-pay | Admitting: Physician Assistant

## 2019-03-02 ENCOUNTER — Ambulatory Visit: Payer: BC Managed Care – PPO | Admitting: Physician Assistant

## 2019-03-02 ENCOUNTER — Ambulatory Visit: Payer: BC Managed Care – PPO | Admitting: Family Medicine

## 2019-03-02 DIAGNOSIS — M25561 Pain in right knee: Secondary | ICD-10-CM | POA: Diagnosis not present

## 2019-03-02 NOTE — Progress Notes (Signed)
Office Visit Note   Patient: Phillip Glass           Date of Birth: [birth year]           MRN: [id number] Visit Date: 03/02/2019              Requested by: [name], MD [address] [place],  [place] [address] PCP: [name], MD   Assessment & Plan: Visit Diagnoses:  1. Acute pain of right knee     Plan: He will work on quad strengthening exercises as shown.  We will see him back in 2 weeks to see what type of response he had to the injection.  He is to be mindful of any mechanical symptoms which are reviewed with him.  If he continues to have pain in the knee and mechanical symptoms recommend MRI to rule out meniscal tear giving his normal radiographs without any significant arthritic changes of the right knee. 3 views right knee dated 03/01/2019 are reviewed on canopy.  These showed no acute fracture.  Knee joint is overall well preserved.  Knee is well located.  Follow-Up Instructions: Return in about 2 weeks (around 03/16/2019).   Orders:  No orders of the defined types were placed in this encounter.  No orders of the defined types were placed in this encounter.     Procedures: No procedures performed   Clinical Data: No additional findings.   Subjective: Chief Complaint  Patient presents with  . Right Knee - Pain, Injury    HPI Phillip Glass is a very pleasant 63 year old male comes in today for right knee pain.  Had knee pain for a month after right bike got better but then he twisted his knee this past Tuesday since then he has had giving way sensation of the knee.  Exam pain walking is wearing the knee brace.  He is tried meloxicam without any relief.  No prior giving way sensation of the knee.  He notes swelling of the knee.  Past medical history significant for rheumatoid arthritis, Crohn's disease and hypertension. Review of Systems See HPI otherwise negative or noncontributory.  Objective: Vital Signs: There were no vitals taken for this  visit.  Physical Exam General: Well-developed pleasant male in no acute distress. Psych: Alert and oriented x3 Ortho Exam Bilateral knees good range of motion of both knees.  Nontender along medial lateral joint line McMurray's is negative bilaterally.  No instability valgus varus stressing of either knee.  Slight effusion of the right knee.  No abnormal warmth or erythema of either knee.  Specialty Comments:  No specialty comments available.  Imaging: Dg Knee 3 Views Right  Result Date: 03/01/2019 CLINICAL DATA:  Right knee pain, worse after twisting last night. EXAM: RIGHT KNEE - 3 VIEW COMPARISON:  Right knee radiograph 02/17/2019 FINDINGS: No evidence of fracture or dislocation. Alignment joint spaces are maintained. Tiny patellar spurs. Small knee joint effusion. No evidence of arthropathy or other focal bone abnormality. Soft tissues are unremarkable. IMPRESSION: Small knee joint effusion. No acute osseous abnormality. Electronically Signed   By: Keith Rake M.D.   On: 03/01/2019 17:44     PMFS History: Patient Active Problem List   Diagnosis Date Noted  . Polyarthralgia 01/18/2014  . Colitis 04/08/2012  . Multiple nevi 03/03/2012   Past Medical History:  Diagnosis Date  . Crohn's colitis ([place])     Family History  Problem Relation Age of Onset  . Hypertension Father   .  Heart disease Father 68       AMI age 50; second  AMI age 29 cause of death  . Diabetes Sister   . Dementia Mother   . Hypertension Mother     Past Surgical History:  Procedure Laterality Date  . COLONOSCOPY  01/12/2011  . TONSILLECTOMY     Social History   Occupational History  . Not on file  Tobacco Use  . Smoking status: Never Smoker  . Smokeless tobacco: Never Used  Substance and Sexual Activity  . Alcohol use: No    Alcohol/week: 0.0 standard drinks  . Drug use: No  . Sexual activity: Never    Birth control/protection: Abstinence

## 2019-03-08 ENCOUNTER — Other Ambulatory Visit: Payer: Self-pay

## 2019-03-08 ENCOUNTER — Telehealth (INDEPENDENT_AMBULATORY_CARE_PROVIDER_SITE_OTHER): Payer: BC Managed Care – PPO | Admitting: Family Medicine

## 2019-03-08 ENCOUNTER — Encounter: Payer: Self-pay | Admitting: Family Medicine

## 2019-03-08 DIAGNOSIS — R0981 Nasal congestion: Secondary | ICD-10-CM | POA: Diagnosis not present

## 2019-03-08 DIAGNOSIS — R05 Cough: Secondary | ICD-10-CM | POA: Diagnosis not present

## 2019-03-08 DIAGNOSIS — R059 Cough, unspecified: Secondary | ICD-10-CM

## 2019-03-08 NOTE — Patient Instructions (Signed)
Saline nasal spray, Mucinex, Tylenol if needed for recurrent symptoms.  COVID-19 test was ordered, ideally would recommend getting that test done on Friday at the [facility]  I recommend you isolate until negative results of test, or at least 10 days from onset of symptoms as long as you are improving at day 10 and no fever in the previous 24 hours.  Let me know if there are questions.

## 2019-03-08 NOTE — Progress Notes (Signed)
Cc-coughing, soreness in rib from the cough- Took Mucinex D. Have some Sinus pressure, stuffy nose.

## 2019-03-08 NOTE — Progress Notes (Signed)
Virtual Visit via Telephone Note  I connected with Phillip Glass on 03/08/19 at 6:21 PM by telephone and verified that I am speaking with the correct person using two identifiers.   I discussed the limitations, risks, security and privacy concerns of performing an evaluation and management service by telephone and the availability of in person appointments. I also discussed with the patient that there may be a patient responsible charge related to this service. The patient expressed understanding and agreed to proceed, consent obtained  Chief complaint: Cough   History of Present Illness:  Phillip Glass is a 63 y.o. male  Cough: Started yesterday afternoon. Worked until 9pm. Mucinex last night and this am. Back hurt this am - upper ribs to cough - soreness on and off. No other body aches.  Sinus pressure, increased sinus congestion. Clear chest mucus usually.  No fever.  No sore throat. No shortness of breath.  No change in taste/smell.  No known covid exposure/sick contacts.    Patient Active Problem List   Diagnosis Date Noted  . Polyarthralgia 01/18/2014  . Colitis 04/08/2012  . Multiple nevi 03/03/2012   Past Medical History:  Diagnosis Date  . Crohn's colitis [facility])    Past Surgical History:  Procedure Laterality Date  . COLONOSCOPY  01/12/2011  . TONSILLECTOMY     Allergies  Allergen Reactions  . Codeine Other (See Comments)    Pt states he hallucinates   Prior to Admission medications   Medication Sig Start Date End Date Taking? Authorizing Provider  Adalimumab (HUMIRA PEN) 40 MG/0.8ML PNKT Inject 40 mg into the skin. 05/22/15  Yes [provider]  Aloe Vera 500 MG CAPS Take 1 capsule by mouth daily.   Yes [provider]  FOLIC ACID PO Take by mouth daily.   Yes [provider]  Garlic 614 MG TABS Take 1 tablet by mouth daily.   Yes [provider]  mesalamine (CANASA) 1000 MG suppository Place 1,000 mg rectally at  bedtime.   Yes [provider]  methotrexate (RHEUMATREX) 10 MG tablet Take 50 mg by mouth once a week. Caution: Chemotherapy. Protect from light.    Yes [provider]  Multiple Vitamins-Minerals (MULTIVITAMIN WITH MINERALS) tablet Take 1 tablet by mouth daily.   Yes [provider]  Specialty Vitamins Products (ONE-A-DAY CHOLESTEROL) TABS Take 1 tablet by mouth daily.   Yes [provider]  amLODipine (NORVASC) 5 MG tablet Take 1 tablet (5 mg total) by mouth daily. 12/01/18 03/01/19  [name], MD   Social History   Socioeconomic History  . Marital status: Legally Separated    Spouse name: Not on file  . Number of children: Not on file  . Years of education: Not on file  . Highest education level: Not on file  Occupational History  . Not on file  Social Needs  . Financial resource strain: Not on file  . Food insecurity    Worry: Not on file    Inability: Not on file  . Transportation needs    Medical: Not on file    Non-medical: Not on file  Tobacco Use  . Smoking status: Never Smoker  . Smokeless tobacco: Never Used  Substance and Sexual Activity  . Alcohol use: No    Alcohol/week: 0.0 standard drinks  . Drug use: No  . Sexual activity: Never    Birth control/protection: Abstinence  Lifestyle  . Physical activity    Days per week: Not  on file    Minutes per session: Not on file  . Stress: Not on file  Relationships  . Social Herbalist on phone: Not on file    Gets together: Not on file    Attends religious service: Not on file    Active member of club or organization: Not on file    Attends meetings of clubs or organizations: Not on file    Relationship status: Not on file  . Intimate partner violence    Fear of current or ex partner: Not on file    Emotionally abused: Not on file    Physically abused: Not on file    Forced sexual activity: Not on file  Other Topics Concern  . Not on file  Social History  Narrative   Marital status: divorced in 2013.  Not dating.  Married x 20 years.      Children:  1 child (22); no grandchild.      Lives: with son.      Employment: Scientist, clinical (histocompatibility and immunogenetics) x 35 years; happy.      Tobacco; none      Alcohol: none      Drugs; none      Exercise:  6 days per week for 45 minutes.  Cardio twice weekly; weightlifting 5 days per week.      Seatbelt: 100%      Guns:  Locked and loaded.     Observations/Objective:  No distress.  Speaking normally during visit, appropriate responses. There were no vitals filed for this visit.  Assessment and Plan: Cough - Plan: Novel Coronavirus, NAA ([facility])  Sinus congestion - Plan: Novel Coronavirus, NAA ([facility])  Started yesterday, denies fever, dyspnea.  Possible other viral illness, no known exposure to COVID-19.  Symptomatic care discussed, will obtain covid 19 testing, isolation/quarantine discussed until testing results or 10 days form onset if improving and afebrile.   Did recommend testing at day 4 of symptoms. ER precautions given.   Follow Up Instructions:  as needed.  Rtc/ER precautions given.    I discussed the assessment and treatment plan with the patient. The patient was provided an opportunity to ask questions and all were answered. The patient agreed with the plan and demonstrated an understanding of the instructions.   The patient was advised to call back or seek an in-person evaluation if the symptoms worsen or if the condition fails to improve as anticipated.  I provided 14 minutes of non-face-to-face time during this encounter.  Signed,   [name], MD [facility].  03/08/19

## 2019-03-09 DIAGNOSIS — K5010 Crohn's disease of large intestine without complications: Secondary | ICD-10-CM | POA: Diagnosis not present

## 2019-03-10 ENCOUNTER — Other Ambulatory Visit: Payer: Self-pay

## 2019-03-10 DIAGNOSIS — Z20822 Contact with and (suspected) exposure to covid-19: Secondary | ICD-10-CM

## 2019-03-11 LAB — NOVEL CORONAVIRUS, NAA: SARS-CoV-2, NAA: NOT DETECTED

## 2019-03-16 ENCOUNTER — Ambulatory Visit: Payer: BC Managed Care – PPO | Admitting: Physician Assistant

## 2019-03-16 ENCOUNTER — Encounter: Payer: Self-pay | Admitting: Physician Assistant

## 2019-03-16 ENCOUNTER — Other Ambulatory Visit: Payer: Self-pay

## 2019-03-16 DIAGNOSIS — M1990 Unspecified osteoarthritis, unspecified site: Secondary | ICD-10-CM | POA: Diagnosis not present

## 2019-03-16 DIAGNOSIS — M25561 Pain in right knee: Secondary | ICD-10-CM | POA: Diagnosis not present

## 2019-03-16 DIAGNOSIS — K5010 Crohn's disease of large intestine without complications: Secondary | ICD-10-CM | POA: Diagnosis not present

## 2019-03-16 DIAGNOSIS — M0609 Rheumatoid arthritis without rheumatoid factor, multiple sites: Secondary | ICD-10-CM | POA: Diagnosis not present

## 2019-03-16 DIAGNOSIS — K50818 Crohn's disease of both small and large intestine with other complication: Secondary | ICD-10-CM | POA: Diagnosis not present

## 2019-03-16 NOTE — Progress Notes (Signed)
HPI: Phillip Glass returns today comes in today 2 weeks follow-up right knee status post injection.  He states he is overall doing well some swelling medial aspect remains medial knee pain with standing too long.  He is wanting mainly no what exercises he can do in the gym.  He denies any mechanical symptoms of the right knee.  Review of systems see HPI otherwise negative  Physical exam: Right knee full extension full flexion no instability valgus varus stressing.  McMurray's negative.  No effusion abnormal warmth erythema.  Mild tenderness along medial joint line.  Impression: Acute right knee pain  Plan: We will have him work on Forensic scientist.  Discussed knee friendly exercises with him at length.  He will follow-up with [place] if he has any mechanical symptoms which are described to him today for recurrent effusion.  However if he has any of these symptoms or recurrent effusion in the next 3 months and call the office and we can obtain an MRI of his right knee to rule out meniscal tear.  Otherwise he will follow with [place] as needed.  Questions encouraged and answered

## 2019-06-01 ENCOUNTER — Encounter: Payer: Self-pay | Admitting: Family Medicine

## 2019-06-01 ENCOUNTER — Ambulatory Visit: Payer: BC Managed Care – PPO | Admitting: Family Medicine

## 2019-06-01 ENCOUNTER — Other Ambulatory Visit: Payer: Self-pay

## 2019-06-01 VITALS — BP 130/84 | HR 79 | Temp 97.9°F | Ht 67.0 in | Wt 208.0 lb

## 2019-06-01 DIAGNOSIS — E785 Hyperlipidemia, unspecified: Secondary | ICD-10-CM

## 2019-06-01 DIAGNOSIS — R739 Hyperglycemia, unspecified: Secondary | ICD-10-CM

## 2019-06-01 DIAGNOSIS — I10 Essential (primary) hypertension: Secondary | ICD-10-CM | POA: Diagnosis not present

## 2019-06-01 NOTE — Progress Notes (Signed)
Subjective:  Patient ID: Phillip Glass, male    DOB: [birth year]  Age: 64 y.o. MRN: [id number]  CC:  Chief Complaint  Patient presents with  . Follow-up    on hypertension. Pat hasn't had any issues with this condition sinc elast visit. pt check his BP every other day, and BP has been pretty consistant within normal range. Pt hasn't had any physical  symptoms of hypertension, and medication seems to be working fine with no known side effects.    HPI [name] presents for   Hypertension: Amlodipine 5 mg daily. Home readings: 135/88. High 147/90 few weeks ago.  Usually 130/70's.  No new side effects with meds.  Knee pain has improved - saw ortho. Starting light weights.   BP Readings from Last 3 Encounters:  06/01/19 130/84  03/01/19 (!) 146/80  02/17/19 136/80   Lab Results  Component Value Date   CREATININE 1.08 06/24/2017    Hyperlipidemia: 10 year ascvd risk score 14% in September. Brother had issues with statin - he improved numbers with diet. He would like to try diet cahnge as well - has not made any changes in diet. Some decreased exercise when knee was bothering him.  FH of heart disease - massive MI at age 73, 2 prior MI - initially age 64. Not fasting today.  Has bloodwork planned at [place] -Dr [place] Crane. Today.  Last cr 0.99 on 03/11/2019 in Care everywhere. Glucose 170, no recent A1c.    Wt Readings from Last 3 Encounters:  06/01/19 208 lb (94.3 kg)  03/01/19 204 lb (92.5 kg)  02/17/19 203 lb (92.1 kg)    Lab Results  Component Value Date   CHOL 258 (H) 12/22/2018   HDL 59 12/22/2018   LDLCALC 158 (H) 12/22/2018   TRIG 226 (H) 12/22/2018   CHOLHDL 4.4 12/22/2018   Lab Results  Component Value Date   ALT 20 06/24/2017   AST 19 06/24/2017   ALKPHOS 66 06/24/2017   BILITOT 0.5 06/24/2017   Hyperglycemia: 170 on 02/2019 labs. Sister with diabetes, aunt with diabetes.   History Patient Active Problem List   Diagnosis Date  Noted  . Polyarthralgia 01/18/2014  . Colitis 04/08/2012  . Multiple nevi 03/03/2012   Past Medical History:  Diagnosis Date  . Crohn's colitis [facility])    Past Surgical History:  Procedure Laterality Date  . COLONOSCOPY  01/12/2011  . TONSILLECTOMY     Allergies  Allergen Reactions  . Codeine Other (See Comments)    Pt states he hallucinates   Prior to Admission medications   Medication Sig Start Date End Date Taking? Authorizing Provider  Adalimumab (HUMIRA PEN) 40 MG/0.8ML PNKT Inject 40 mg into the skin. 05/22/15  Yes [provider]  Aloe Vera 500 MG CAPS Take 1 capsule by mouth daily.   Yes [provider]  FOLIC ACID PO Take by mouth daily.   Yes [provider]  Garlic 326 MG TABS Take 1 tablet by mouth daily.   Yes [provider]  methotrexate (RHEUMATREX) 10 MG tablet Take 25 mg by mouth once a week. Caution: Chemotherapy. Protect from light.    Yes [provider]  Multiple Vitamins-Minerals (MULTIVITAMIN WITH MINERALS) tablet Take 1 tablet by mouth daily.   Yes [provider]  Specialty Vitamins Products (ONE-A-DAY CHOLESTEROL) TABS Take 1 tablet by mouth daily.   Yes [provider]  amLODipine (NORVASC) 5 MG tablet Take 1 tablet (5 mg total)  by mouth daily. 12/01/18 03/01/19  [name], MD  mesalamine (CANASA) 1000 MG suppository Place 1,000 mg rectally at bedtime.    [provider]   Social History   Socioeconomic History  . Marital status: Legally Separated    Spouse name: Not on file  . Number of children: Not on file  . Years of education: Not on file  . Highest education level: Not on file  Occupational History  . Not on file  Tobacco Use  . Smoking status: Never Smoker  . Smokeless tobacco: Never Used  Substance and Sexual Activity  . Alcohol use: No    Alcohol/week: 0.0 standard drinks  . Drug use: No  . Sexual activity: Never    Birth control/protection: Abstinence  Other  Topics Concern  . Not on file  Social History Narrative   Marital status: divorced in 2013.  Not dating.  Married x 20 years.      Children:  1 child (22); no grandchild.      Lives: with son.      Employment: Scientist, clinical (histocompatibility and immunogenetics) x 35 years; happy.      Tobacco; none      Alcohol: none      Drugs; none      Exercise:  6 days per week for 45 minutes.  Cardio twice weekly; weightlifting 5 days per week.      Seatbelt: 100%      Guns:  Locked and loaded.   Social Determinants of Health   Financial Resource Strain:   . Difficulty of Paying Living Expenses: Not on file  Food Insecurity:   . Worried About Charity fundraiser in the Last Year: Not on file  . Ran Out of Food in the Last Year: Not on file  Transportation Needs:   . Lack of Transportation (Medical): Not on file  . Lack of Transportation (Non-Medical): Not on file  Physical Activity:   . Days of Exercise per Week: Not on file  . Minutes of Exercise per Session: Not on file  Stress:   . Feeling of Stress : Not on file  Social Connections:   . Frequency of Communication with Friends and Family: Not on file  . Frequency of Social Gatherings with Friends and Family: Not on file  . Attends Religious Services: Not on file  . Active Member of Clubs or Organizations: Not on file  . Attends Archivist Meetings: Not on file  . Marital Status: Not on file  Intimate Partner Violence:   . Fear of Current or Ex-Partner: Not on file  . Emotionally Abused: Not on file  . Physically Abused: Not on file  . Sexually Abused: Not on file    Review of Systems  Constitutional: Negative for fatigue and unexpected weight change.  Eyes: Negative for visual disturbance.  Respiratory: Negative for cough, chest tightness and shortness of breath.   Cardiovascular: Negative for chest pain, palpitations and leg swelling.  Gastrointestinal: Negative for abdominal pain and blood in stool.  Neurological: Negative for dizziness,  light-headedness and headaches.     Objective:   Vitals:   06/01/19 1518 06/01/19 1522  BP: (!) 161/88 130/84  Pulse: 79   Temp: 97.9 F (36.6 C)   TempSrc: Temporal   SpO2: 96%   Weight: 208 lb (94.3 kg)   Height: 5' 7"  (1.702 m)     Physical Exam Vitals reviewed.  Constitutional:      Appearance: He is well-developed.  HENT:  Head: Normocephalic and atraumatic.  Eyes:     Pupils: Pupils are equal, round, and reactive to light.  Neck:     Vascular: No carotid bruit or JVD.  Cardiovascular:     Rate and Rhythm: Normal rate and regular rhythm.     Heart sounds: Normal heart sounds. No murmur.  Pulmonary:     Effort: Pulmonary effort is normal.     Breath sounds: Normal breath sounds. No rales.  Skin:    General: Skin is warm and dry.  Neurological:     Mental Status: He is alert and oriented to person, place, and time.        Assessment & Plan:  ARASH KARSTENS is a 64 y.o. male . Hyperlipidemia, unspecified hyperlipidemia type  -Concerns discussed regarding his previous elevations and family history of heart disease in his father.  Recommend statin, even low-dose with co-Q10 supplement if tolerated.  He will talk to his brother to determine which medicine he took so we can pick a different one.  Held on labs today.  Also plans on improved diet/exercise.  Essential hypertension  -Stable, continue amlodipine same dose.  Labs pending with his specialist today for creatinine which has been stable previously  Hyperglycemia  -On outside labs.  Family history of diabetes.  If elevated on labs at specialist, recommended follow-up for A1c/further testing.  Discussed covid vaccine, plans on [employer] version.  No orders of the defined types were placed in this encounter.  Patient Instructions       If you have lab work done today you will be contacted with your lab results within the next 2 weeks.  If you have not heard from [place] then please contact us.  The fastest way to get your results is to register for My Chart.   IF you received an x-ray today, you will receive an invoice from [facility]. Please contact [facility] at [phone] with questions or concerns regarding your invoice.   IF you received labwork today, you will receive an invoice from [facility]. Please contact [facility] at [phone] with questions or concerns regarding your invoice.   Our billing staff will not be able to assist you with questions regarding bills from these companies.  You will be contacted with the lab results as soon as they are available. The fastest way to get your results is to activate your My Chart account. Instructions are located on the last page of this paperwork. If you have not heard from [place] regarding the results in 2 weeks, please contact this office.         Signed, [name], MD [facility]

## 2019-06-01 NOTE — Patient Instructions (Addendum)
I would strongly recommend some statin with your prior readings and family history of heart disease in dad. coQ10 supplement may help with muscle aches. Let me know what you find out from your brother. Can decide on repeat testing once we decide on meds.  No change in blood pressure meds at this time.  If blood sugar elevated on your outside labs, will need to follow up for diabetes testing.   If you have lab work done today you will be contacted with your lab results within the next 2 weeks.  If you have not heard from [place] then please contact us. The fastest way to get your results is to register for My Chart.   IF you received an x-ray today, you will receive an invoice from [facility]. Please contact [facility]iology at [phone] with questions or concerns regarding your invoice.   IF you received labwork today, you will receive an invoice from [facility]. Please contact [facility] at [phone] with questions or concerns regarding your invoice.   Our billing staff will not be able to assist you with questions regarding bills from these companies.  You will be contacted with the lab results as soon as they are available. The fastest way to get your results is to activate your My Chart account. Instructions are located on the last page of this paperwork. If you have not heard from [place] regarding the results in 2 weeks, please contact this office.

## 2019-06-02 NOTE — Telephone Encounter (Signed)
Pt is wanting to try the crestor 39m. This is the med that his brother is taking. Per your note  Concerns discussed regarding his previous elevations and family history of heart disease in his father.  Recommend statin, even low-dose with co-Q10 supplement if tolerated. "He will talk to his brother to determine which medicine he took so we can pick a different one"  Held on labs today.  Also plans on improved diet/exercise

## 2019-06-03 ENCOUNTER — Encounter: Payer: Self-pay | Admitting: Family Medicine

## 2019-06-04 MED ORDER — SIMVASTATIN 20 MG PO TABS: 20.0000 mg | ORAL_TABLET | Freq: Every day | ORAL | 0 refills | Status: DC

## 2019-06-26 DIAGNOSIS — K5010 Crohn's disease of large intestine without complications: Secondary | ICD-10-CM | POA: Diagnosis not present

## 2019-06-29 ENCOUNTER — Encounter: Payer: Self-pay | Admitting: Family Medicine

## 2019-08-07 ENCOUNTER — Encounter: Payer: Self-pay | Admitting: Family Medicine

## 2019-08-24 DIAGNOSIS — K5010 Crohn's disease of large intestine without complications: Secondary | ICD-10-CM | POA: Diagnosis not present

## 2019-09-07 DIAGNOSIS — K5010 Crohn's disease of large intestine without complications: Secondary | ICD-10-CM | POA: Diagnosis not present

## 2019-09-08 ENCOUNTER — Other Ambulatory Visit: Payer: Self-pay | Admitting: Family Medicine

## 2019-09-08 DIAGNOSIS — E785 Hyperlipidemia, unspecified: Secondary | ICD-10-CM

## 2019-09-14 DIAGNOSIS — K50818 Crohn's disease of both small and large intestine with other complication: Secondary | ICD-10-CM | POA: Diagnosis not present

## 2019-09-14 DIAGNOSIS — M1990 Unspecified osteoarthritis, unspecified site: Secondary | ICD-10-CM | POA: Diagnosis not present

## 2019-09-14 DIAGNOSIS — M0609 Rheumatoid arthritis without rheumatoid factor, multiple sites: Secondary | ICD-10-CM | POA: Diagnosis not present

## 2019-10-12 ENCOUNTER — Other Ambulatory Visit: Payer: Self-pay

## 2019-10-12 ENCOUNTER — Encounter: Payer: Self-pay | Admitting: Emergency Medicine

## 2019-10-12 ENCOUNTER — Ambulatory Visit: Payer: BC Managed Care – PPO | Admitting: Emergency Medicine

## 2019-10-12 VITALS — BP 126/73 | HR 71 | Temp 97.6°F | Resp 16 | Ht 67.0 in | Wt 199.0 lb

## 2019-10-12 DIAGNOSIS — L84 Corns and callosities: Secondary | ICD-10-CM

## 2019-10-12 DIAGNOSIS — M79674 Pain in right toe(s): Secondary | ICD-10-CM

## 2019-10-12 NOTE — Patient Instructions (Addendum)
If you have lab work done today you will be contacted with your lab results within the next 2 weeks.  If you have not heard from [place] then please contact us. The fastest way to get your results is to register for My Chart.   IF you received an x-ray today, you will receive an invoice from [facility]. Please contact [facility] at [phone] with questions or concerns regarding your invoice.   IF you received labwork today, you will receive an invoice from [place]. Please contact [facility] at [phone] with questions or concerns regarding your invoice.   Our billing staff will not be able to assist you with questions regarding bills from these companies.  You will be contacted with the lab results as soon as they are available. The fastest way to get your results is to activate your My Chart account. Instructions are located on the last page of this paperwork. If you have not heard from [place] regarding the results in 2 weeks, please contact this office.     Corns and Calluses Corns are small areas of thickened skin that occur on the top, sides, or tip of a toe. They contain a cone-shaped core with a point that can press on a nerve below. This causes pain.  Calluses are areas of thickened skin that can occur anywhere on the body, including the hands, fingers, palms, soles of the feet, and heels. Calluses are usually larger than corns. What are the causes? Corns and calluses are caused by rubbing (friction) or pressure, such as from shoes that are too tight or do not fit properly. What increases the risk? Corns are more likely to develop in people who have misshapen toes (toe deformities), such as hammer toes. Calluses can occur with friction to any area of the skin. They are more likely to develop in people who:  Work with their hands.  Wear shoes that fit poorly, are too tight, or are high-heeled.  Have toe deformities. What are the signs or symptoms? Symptoms of  a corn or callus include:  A hard growth on the skin.  Pain or tenderness under the skin.  Redness and swelling.  Increased discomfort while wearing tight-fitting shoes, if your feet are affected. If a corn or callus becomes infected, symptoms may include:  Redness and swelling that gets worse.  Pain.  Fluid, blood, or pus draining from the corn or callus. How is this diagnosed? Corns and calluses may be diagnosed based on your symptoms, your medical history, and a physical exam. How is this treated? Treatment for corns and calluses may include:  Removing the cause of the friction or pressure. This may involve: ? Changing your shoes. ? Wearing shoe inserts (orthotics) or other protective layers in your shoes, such as a corn pad. ? Wearing gloves.  Applying medicine to the skin (topical medicine) to help soften skin in the hardened, thickened areas.  Removing layers of dead skin with a file to reduce the size of the corn or callus.  Removing the corn or callus with a scalpel or laser.  Taking antibiotic medicines, if your corn or callus is infected.  Having surgery, if a toe deformity is the cause. Follow these instructions at home:   Take over-the-counter and prescription medicines only as told by your health care provider.  If you were prescribed an antibiotic, take it as told by your health care provider. Do not stop taking it even if your condition starts to improve.  Wear shoes that fit well. Avoid wearing high-heeled shoes and shoes that are too tight or too loose.  Wear any padding, protective layers, gloves, or orthotics as told by your health care provider.  Soak your hands or feet and then use a file or pumice stone to soften your corn or callus. Do this as told by your health care provider.  Check your corn or callus every day for symptoms of infection. Contact a health care provider if you:  Notice that your symptoms do not improve with treatment.  Have  redness or swelling that gets worse.  Notice that your corn or callus becomes painful.  Have fluid, blood, or pus coming from your corn or callus.  Have new symptoms. Summary  Corns are small areas of thickened skin that occur on the top, sides, or tip of a toe.  Calluses are areas of thickened skin that can occur anywhere on the body, including the hands, fingers, palms, and soles of the feet. Calluses are usually larger than corns.  Corns and calluses are caused by rubbing (friction) or pressure, such as from shoes that are too tight or do not fit properly.  Treatment may include wearing any padding, protective layers, gloves, or orthotics as told by your health care provider. This information is not intended to replace advice given to you by your health care provider. Make sure you discuss any questions you have with your health care provider. Document Revised: 07/06/2018 Document Reviewed: 01/27/2017 Elsevier Patient Education  2020 [employer].

## 2019-10-12 NOTE — Progress Notes (Signed)
Phillip Glass 64 y.o.   Chief Complaint  Patient presents with  . Toe Pain    RIGHT 5th with pain for 3-4 days    HISTORY OF PRESENT ILLNESS: This is a 64 y.o. male complaining of toe pain on right foot for several days.  Denies injury or any other associated symptoms. No other complaints or medical concerns today.  HPI   Prior to Admission medications   Medication Sig Start Date End Date Taking? Authorizing Provider  Adalimumab (HUMIRA PEN) 40 MG/0.8ML PNKT Inject 40 mg into the skin. 05/22/15  Yes [provider]  Aloe Vera 500 MG CAPS Take 1 capsule by mouth daily.   Yes [provider]  FOLIC ACID PO Take by mouth daily.   Yes [provider]  Garlic 941 MG TABS Take 1 tablet by mouth daily.   Yes [provider]  mesalamine (CANASA) 1000 MG suppository Place 1,000 mg rectally at bedtime.   Yes [provider]  methotrexate (RHEUMATREX) 10 MG tablet Take 25 mg by mouth once a week. Caution: Chemotherapy. Protect from light.    Yes [provider]  Multiple Vitamins-Minerals (MULTIVITAMIN WITH MINERALS) tablet Take 1 tablet by mouth daily.   Yes [provider]  simvastatin (ZOCOR) 20 MG tablet TAKE 1 TABLET(20 MG) BY MOUTH AT BEDTIME. START 1 TIME EVERY WEEK INITIALLY AND. INCREASE DOSING AS TOLERATED 09/08/19  Yes [name], MD  amLODipine (NORVASC) 5 MG tablet Take 1 tablet (5 mg total) by mouth daily. 12/01/18 03/01/19  [name], MD  Specialty Vitamins Products (ONE-A-DAY CHOLESTEROL) TABS Take 1 tablet by mouth daily. Patient not taking: Reported on 10/12/2019    [provider]    Allergies  Allergen Reactions  . Codeine Other (See Comments)    Pt states he hallucinates    Patient Active Problem List   Diagnosis Date Noted  . Polyarthralgia 01/18/2014  . Colitis 04/08/2012  . Multiple nevi 03/03/2012    Past Medical History:  Diagnosis Date  . Crohn's colitis [facility])     Past  Surgical History:  Procedure Laterality Date  . COLONOSCOPY  01/12/2011  . TONSILLECTOMY      Social History   Socioeconomic History  . Marital status: Legally Separated    Spouse name: Not on file  . Number of children: Not on file  . Years of education: Not on file  . Highest education level: Not on file  Occupational History  . Not on file  Tobacco Use  . Smoking status: Never Smoker  . Smokeless tobacco: Never Used  Vaping Use  . Vaping Use: Never used  Substance and Sexual Activity  . Alcohol use: No    Alcohol/week: 0.0 standard drinks  . Drug use: No  . Sexual activity: Never    Birth control/protection: Abstinence  Other Topics Concern  . Not on file  Social History Narrative   Marital status: divorced in 2013.  Not dating.  Married x 20 years.      Children:  1 child (22); no grandchild.      Lives: with son.      Employment: Scientist, clinical (histocompatibility and immunogenetics) x 35 years; happy.      Tobacco; none      Alcohol: none      Drugs; none      Exercise:  6 days per week for 45 minutes.  Cardio twice weekly; weightlifting 5 days per week.      Seatbelt: 100%  Guns:  Locked and loaded.   Social Determinants of Health   Financial Resource Strain:   . Difficulty of Paying Living Expenses:   Food Insecurity:   . Worried About Charity fundraiser in the Last Year:   . Arboriculturist in the Last Year:   Transportation Needs:   . Film/video editor (Medical):   Marland Kitchen Lack of Transportation (Non-Medical):   Physical Activity:   . Days of Exercise per Week:   . Minutes of Exercise per Session:   Stress:   . Feeling of Stress :   Social Connections:   . Frequency of Communication with Friends and Family:   . Frequency of Social Gatherings with Friends and Family:   . Attends Religious Services:   . Active Member of Clubs or Organizations:   . Attends Archivist Meetings:   Marland Kitchen Marital Status:   Intimate Partner Violence:   . Fear of Current or Ex-Partner:   .  Emotionally Abused:   Marland Kitchen Physically Abused:   . Sexually Abused:     Family History  Problem Relation Age of Onset  . Hypertension Father   . Heart disease Father 50       AMI age 64; second  AMI age 63 cause of death  . Diabetes Sister   . Dementia Mother   . Hypertension Mother      Review of Systems  Constitutional: Negative.  Negative for fever.  HENT: Negative.  Negative for congestion and sore throat.   Respiratory: Negative.  Negative for cough and shortness of breath.   Cardiovascular: Negative.  Negative for chest pain and palpitations.  Gastrointestinal: Negative for abdominal pain, nausea and vomiting.  Genitourinary: Negative.  Negative for dysuria and hematuria.  Skin: Negative.  Negative for rash.  Neurological: Negative.  Negative for dizziness and headaches.  All other systems reviewed and are negative.  Today's Vitals   10/12/19 1633  BP: 126/73  Pulse: 71  Resp: 16  Temp: 97.6 F (36.4 C)  TempSrc: Temporal  SpO2: 96%  Weight: 199 lb (90.3 kg)  Height: 5' 7"  (1.702 m)   Body mass index is 31.17 kg/m.   Physical Exam Vitals reviewed.  Constitutional:      Appearance: Normal appearance.  HENT:     Head: Normocephalic.  Eyes:     Extraocular Movements: Extraocular movements intact.  Cardiovascular:     Rate and Rhythm: Normal rate.  Pulmonary:     Effort: Pulmonary effort is normal.  Musculoskeletal:     Cervical back: Normal range of motion.     Comments: Right foot: Warm to touch.  No erythema or bruising.  Neurovascularly intact.  Lateral tender corns on first and fifth toes.  No signs of infection.  Skin:    General: Skin is warm and dry.  Neurological:     General: No focal deficit present.     Mental Status: He is alert and oriented to person, place, and time.  Psychiatric:        Mood and Affect: Mood normal.        Behavior: Behavior normal.      ASSESSMENT & PLAN: Annette was seen today for toe pain.  Diagnoses and all  orders for this visit:  Foot callus -     Ambulatory referral to Podiatry  Corns of multiple toes Comments: painful     Patient Instructions       If you have lab work done today you  will be contacted with your lab results within the next 2 weeks.  If you have not heard from [place] then please contact us. The fastest way to get your results is to register for My Chart.   IF you received an x-ray today, you will receive an invoice from [facility]. Please contact [facility] at [phone] with questions or concerns regarding your invoice.   IF you received labwork today, you will receive an invoice from [place]. Please contact [facility] at [phone] with questions or concerns regarding your invoice.   Our billing staff will not be able to assist you with questions regarding bills from these companies.  You will be contacted with the lab results as soon as they are available. The fastest way to get your results is to activate your My Chart account. Instructions are located on the last page of this paperwork. If you have not heard from [place] regarding the results in 2 weeks, please contact this office.     Corns and Calluses Corns are small areas of thickened skin that occur on the top, sides, or tip of a toe. They contain a cone-shaped core with a point that can press on a nerve below. This causes pain.  Calluses are areas of thickened skin that can occur anywhere on the body, including the hands, fingers, palms, soles of the feet, and heels. Calluses are usually larger than corns. What are the causes? Corns and calluses are caused by rubbing (friction) or pressure, such as from shoes that are too tight or do not fit properly. What increases the risk? Corns are more likely to develop in people who have misshapen toes (toe deformities), such as hammer toes. Calluses can occur with friction to any area of the skin. They are more likely to develop in people  who:  Work with their hands.  Wear shoes that fit poorly, are too tight, or are high-heeled.  Have toe deformities. What are the signs or symptoms? Symptoms of a corn or callus include:  A hard growth on the skin.  Pain or tenderness under the skin.  Redness and swelling.  Increased discomfort while wearing tight-fitting shoes, if your feet are affected. If a corn or callus becomes infected, symptoms may include:  Redness and swelling that gets worse.  Pain.  Fluid, blood, or pus draining from the corn or callus. How is this diagnosed? Corns and calluses may be diagnosed based on your symptoms, your medical history, and a physical exam. How is this treated? Treatment for corns and calluses may include:  Removing the cause of the friction or pressure. This may involve: ? Changing your shoes. ? Wearing shoe inserts (orthotics) or other protective layers in your shoes, such as a corn pad. ? Wearing gloves.  Applying medicine to the skin (topical medicine) to help soften skin in the hardened, thickened areas.  Removing layers of dead skin with a file to reduce the size of the corn or callus.  Removing the corn or callus with a scalpel or laser.  Taking antibiotic medicines, if your corn or callus is infected.  Having surgery, if a toe deformity is the cause. Follow these instructions at home:   Take over-the-counter and prescription medicines only as told by your health care provider.  If you were prescribed an antibiotic, take it as told by your health care provider. Do not stop taking it even if your condition starts to improve.  Wear shoes that fit well. Avoid wearing high-heeled shoes and shoes  that are too tight or too loose.  Wear any padding, protective layers, gloves, or orthotics as told by your health care provider.  Soak your hands or feet and then use a file or pumice stone to soften your corn or callus. Do this as told by your health care  provider.  Check your corn or callus every day for symptoms of infection. Contact a health care provider if you:  Notice that your symptoms do not improve with treatment.  Have redness or swelling that gets worse.  Notice that your corn or callus becomes painful.  Have fluid, blood, or pus coming from your corn or callus.  Have new symptoms. Summary  Corns are small areas of thickened skin that occur on the top, sides, or tip of a toe.  Calluses are areas of thickened skin that can occur anywhere on the body, including the hands, fingers, palms, and soles of the feet. Calluses are usually larger than corns.  Corns and calluses are caused by rubbing (friction) or pressure, such as from shoes that are too tight or do not fit properly.  Treatment may include wearing any padding, protective layers, gloves, or orthotics as told by your health care provider. This information is not intended to replace advice given to you by your health care provider. Make sure you discuss any questions you have with your health care provider. Document Revised: 07/06/2018 Document Reviewed: 01/27/2017 Elsevier Patient Education  2020 Elsevier Inc.      Agustina Caroli, MD [facility]

## 2019-10-30 ENCOUNTER — Telehealth: Payer: Self-pay | Admitting: Family Medicine

## 2019-10-30 NOTE — Telephone Encounter (Signed)
Pt called in and was seen by Dr.Sagardia   DOS 7.15.2021 pt already talked to [facility]. He was told by his insurance Co that the way the visit was coded they could not pay.. provider coded as office outpatient est / and his insurance wont cover as coded .   Please advise

## 2019-10-31 DIAGNOSIS — K5010 Crohn's disease of large intestine without complications: Secondary | ICD-10-CM | POA: Diagnosis not present

## 2019-10-31 DIAGNOSIS — K5090 Crohn's disease, unspecified, without complications: Secondary | ICD-10-CM | POA: Diagnosis not present

## 2019-10-31 DIAGNOSIS — Z1211 Encounter for screening for malignant neoplasm of colon: Secondary | ICD-10-CM | POA: Diagnosis not present

## 2019-11-07 ENCOUNTER — Ambulatory Visit: Payer: BC Managed Care – PPO | Admitting: Podiatry

## 2019-11-30 ENCOUNTER — Ambulatory Visit (INDEPENDENT_AMBULATORY_CARE_PROVIDER_SITE_OTHER): Payer: BC Managed Care – PPO | Admitting: Family Medicine

## 2019-11-30 ENCOUNTER — Other Ambulatory Visit: Payer: Self-pay

## 2019-11-30 ENCOUNTER — Encounter: Payer: Self-pay | Admitting: Family Medicine

## 2019-11-30 VITALS — BP 138/82 | HR 69 | Temp 98.0°F | Ht 67.0 in | Wt 193.0 lb

## 2019-11-30 DIAGNOSIS — I10 Essential (primary) hypertension: Secondary | ICD-10-CM | POA: Diagnosis not present

## 2019-11-30 DIAGNOSIS — Z131 Encounter for screening for diabetes mellitus: Secondary | ICD-10-CM | POA: Diagnosis not present

## 2019-11-30 DIAGNOSIS — R739 Hyperglycemia, unspecified: Secondary | ICD-10-CM | POA: Diagnosis not present

## 2019-11-30 DIAGNOSIS — E785 Hyperlipidemia, unspecified: Secondary | ICD-10-CM

## 2019-11-30 DIAGNOSIS — Z23 Encounter for immunization: Secondary | ICD-10-CM

## 2019-11-30 DIAGNOSIS — Z0000 Encounter for general adult medical examination without abnormal findings: Secondary | ICD-10-CM

## 2019-11-30 DIAGNOSIS — Z0001 Encounter for general adult medical examination with abnormal findings: Secondary | ICD-10-CM | POA: Diagnosis not present

## 2019-11-30 MED ORDER — AMLODIPINE BESYLATE 5 MG PO TABS: 5.0000 mg | ORAL_TABLET | Freq: Every day | ORAL | 2 refills | Status: DC

## 2019-11-30 MED ORDER — SIMVASTATIN 20 MG PO TABS: ORAL_TABLET | ORAL | 2 refills | Status: DC

## 2019-11-30 NOTE — Patient Instructions (Addendum)
No changes for now, but if blood pressure remains on the high side, could take 2 of the amlodipine.  Watch for leg swelling if you do use the higher dose.  Thanks for coming in today. Take care.   Keeping you healthy  Get these tests  Blood pressure- Have your blood pressure checked once a year by your healthcare provider.  Normal blood pressure is 120/80  Weight- Have your body mass index (BMI) calculated to screen for obesity.  BMI is a measure of body fat based on height and weight. You can also calculate your own BMI at ViewBank[employer].si.  Cholesterol- Have your cholesterol checked every year.  Diabetes- Have your blood sugar checked regularly if you have high blood pressure, high cholesterol, have a family history of diabetes or if you are overweight.  Screening for Colon Cancer- Colonoscopy starting at age 17.  Screening may begin sooner depending on your family history and other health conditions. Follow up colonoscopy as directed by your Gastroenterologist.  Screening for Prostate Cancer- Both blood work (PSA) and a rectal exam help screen for Prostate Cancer.  Screening begins at age 38 with African-American men and at age 58 with Caucasian men.  Screening may begin sooner depending on your family history.  Take these medicines  Aspirin- One aspirin daily can help prevent Heart disease and Stroke.  Flu shot- Every fall.  Tetanus- Every 10 years.  Zostavax- Once after the age of 94 to prevent Shingles.  Pneumonia shot- Once after the age of 13; if you are younger than 66, ask your healthcare provider if you need a Pneumonia shot.  Take these steps  Don't smoke- If you do smoke, talk to your doctor about quitting.  For tips on how to quit, go to www.smokefree.gov or call 1-800-QUIT-NOW.  Be physically active- Exercise 5 days a week for at least 30 minutes.  If you are not already physically active start slow and gradually work up to 30 minutes of moderate physical  activity.  Examples of moderate activity include walking briskly, mowing the yard, dancing, swimming, bicycling, etc.  Eat a healthy diet- Eat a variety of healthy food such as fruits, vegetables, low fat milk, low fat cheese, yogurt, lean meant, poultry, fish, beans, tofu, etc. For more information go to www.thenutritionsource.org  Drink alcohol in moderation- Limit alcohol intake to less than two drinks a day. Never drink and drive.  Dentist- Brush and floss twice daily; visit your dentist twice a year.  Depression- Your emotional health is as important as your physical health. If you're feeling down, or losing interest in things you would normally enjoy please talk to your healthcare provider.  Eye exam- Visit your eye doctor every year.  Safe sex- If you may be exposed to a sexually transmitted infection, use a condom.  Seat belts- Seat belts can save your life; always wear one.  Smoke/Carbon Monoxide detectors- These detectors need to be installed on the appropriate level of your home.  Replace batteries at least once a year.  Skin cancer- When out in the sun, cover up and use sunscreen 15 SPF or higher.  Violence- If anyone is threatening you, please tell your healthcare provider.  Living Will/ Health care power of attorney- Speak with your healthcare provider and family.  If you have lab work done today you will be contacted with your lab results within the next 2 weeks.  If you have not heard from [place] then please contact us. The fastest way to get  your results is to register for My Chart.   IF you received an x-ray today, you will receive an invoice from [facility]. Please contact [facility] at [phone] with questions or concerns regarding your invoice.   IF you received labwork today, you will receive an invoice from [place]. Please contact [facility] at [phone] with questions or concerns regarding your invoice.   Our billing staff will not be able  to assist you with questions regarding bills from these companies.  You will be contacted with the lab results as soon as they are available. The fastest way to get your results is to activate your My Chart account. Instructions are located on the last page of this paperwork. If you have not heard from [place] regarding the results in 2 weeks, please contact this office.

## 2019-11-30 NOTE — Progress Notes (Signed)
Subjective:  Patient ID: Phillip Glass, male    DOB: 1956-03-13  Age: 64 y.o. MRN: [id number]  CC:  Chief Complaint  Patient presents with  . Annual Exam    Pt reports as far as his general health he feels well with no complaints.    HPI Phillip Glass presents for  Annual physical exam. No health changes since last visit.   Crohn's Colitis:  Gastroenterology Dr. Eduard Roux at [place] Treated with Humira 40 mg every other week, methotrexate 20 mg weekly, balsalazide 4.5 g each night.  Clinical remission at May visit on Humira and methotrexate.  Recent colonoscopy August 3.  He has experienced inflammatory arthritis previously - controlled recently.  Rheumatology - Dr. Amil Amen, appt few months ago - no changes.   Hyperlipidemia: Previous ASCVD risk score elevated at 14%.  His brother had issues with statin, he preferred diet changes initially, then was started on simvastatin 20 mg daily.  Family history of heart disease with parent had an MI at age 41, 2 prior MIs initially at age 62.  LFTs normal on outside labs June 10. No new myalgias - doing well on current med.   Lab Results  Component Value Date   CHOL 258 (H) 12/22/2018   HDL 59 12/22/2018   LDLCALC 158 (H) 12/22/2018   TRIG 226 (H) 12/22/2018   CHOLHDL 4.4 12/22/2018   Lab Results  Component Value Date   ALT 20 06/24/2017   AST 19 06/24/2017   ALKPHOS 66 06/24/2017   BILITOT 0.5 06/24/2017   Hypertension: Amlodipine 5 mg daily. No new leg swelling, no new side effects.  Home readings: 138/78 most of the same  BP Readings from Last 3 Encounters:  11/30/19 (!) 144/82  10/12/19 126/73  06/01/19 130/84   Lab Results  Component Value Date   CREATININE 1.08 06/24/2017  Creatinine normal at 1.14 on outside labs June 10  Hyperglycemia: Glucose 170 on December 2020 labs, 118 in March, 111 in June.  Family history of diabetes with sister and aunt. Lab Results  Component Value  Date   HGBA1C 5.7 03/03/2012    Cancer screening Colonoscopy last month.  Lab Results  Component Value Date   PSA1 0.9 06/24/2017   PSA 2.92 03/03/2012  The natural history of prostate cancer and ongoing controversy regarding screening and potential treatment outcomes of prostate cancer has been discussed with the patient. The meaning of a false positive PSA and a false negative PSA has been discussed. He indicates understanding of the limitations of this screening test and wishes NOT to proceed with screening PSA testing.  Immunization History  Administered Date(s) Administered  . Influenza,inj,Quad PF,6+ Mos 01/22/2014, 01/18/2015, 01/29/2016, 01/08/2017, 01/15/2018, 12/22/2018, 11/30/2019  . PFIZER SARS-COV-2 Vaccination 08/30/2019, 09/20/2019  . Pneumococcal Polysaccharide-23 12/11/2015  . Tdap 06/24/2017    Depression screen [facility] 2/9 11/30/2019 10/12/2019 06/01/2019 03/08/2019 03/01/2019  Decreased Interest 0 0 0 0 0  Down, Depressed, Hopeless 0 0 0 0 0  PHQ - 2 Score 0 0 0 0 0    Hearing Screening   125Hz  250Hz  500Hz  1000Hz  2000Hz  3000Hz  4000Hz  6000Hz  8000Hz   Right ear:           Left ear:             Visual Acuity Screening   Right eye Left eye Both eyes  Without correction:     With correction: 20/15 20/20 20/15   optho - appt next week. Wears glasses.  Dental: in 2 weeks. Every 6 months.   Exercise: No gym in last 2 months - working 72 hours per week. Hard If hours do not improve, considering retiring with possible side job. [employer], fashion/clothing.  17,000 steps per day.  Body mass index is 30.23 kg/m.      History Patient Active Problem List   Diagnosis Date Noted  . Polyarthralgia 01/18/2014  . Colitis 04/08/2012  . Multiple nevi 03/03/2012   Past Medical History:  Diagnosis Date  . Crohn's colitis [facility])    Past Surgical History:  Procedure Laterality Date  . COLONOSCOPY  01/12/2011  . TONSILLECTOMY     Allergies  Allergen Reactions  . Codeine Other (See  Comments)    Pt states he hallucinates   Prior to Admission medications   Medication Sig Start Date End Date Taking? Authorizing Provider  Adalimumab (HUMIRA PEN) 40 MG/0.8ML PNKT Inject 40 mg into the skin. 05/22/15  Yes [provider]  Aloe Vera 500 MG CAPS Take 1 capsule by mouth daily.   Yes [provider]  FOLIC ACID PO Take by mouth daily.   Yes [provider]  Garlic 962 MG TABS Take 1 tablet by mouth daily.   Yes [provider]  methotrexate (RHEUMATREX) 10 MG tablet Take 25 mg by mouth once a week. Caution: Chemotherapy. Protect from light.    Yes [provider]  Multiple Vitamins-Minerals (MULTIVITAMIN WITH MINERALS) tablet Take 1 tablet by mouth daily.   Yes [provider]  simvastatin (ZOCOR) 20 MG tablet TAKE 1 TABLET(20 MG) BY MOUTH AT BEDTIME. START 1 TIME EVERY WEEK INITIALLY AND. INCREASE DOSING AS TOLERATED 09/08/19  Yes [name], MD  Specialty Vitamins Products (ONE-A-DAY CHOLESTEROL) TABS Take 1 tablet by mouth daily.    Yes [provider]  amLODipine (NORVASC) 5 MG tablet Take 1 tablet (5 mg total) by mouth daily. 12/01/18 03/01/19  [name], MD   Social History   Socioeconomic History  . Marital status: Legally Separated    Spouse name: Not on file  . Number of children: Not on file  . Years of education: Not on file  . Highest education level: Not on file  Occupational History  . Not on file  Tobacco Use  . Smoking status: Never Smoker  . Smokeless tobacco: Never Used  Vaping Use  . Vaping Use: Never used  Substance and Sexual Activity  . Alcohol use: Not on file  . Drug use: No  . Sexual activity: Not Currently    Birth control/protection: Abstinence  Other Topics Concern  . Not on file  Social History Narrative   Marital status: divorced in 2013.  Not dating.  Married x 20 years.      Children:  1 child (22); no grandchild.      Lives: with son.      Employment:  Scientist, clinical (histocompatibility and immunogenetics) x 35 years; happy.      Tobacco; none      Alcohol: none      Drugs; none      Exercise:  6 days per week for 45 minutes.  Cardio twice weekly; weightlifting 5 days per week.      Seatbelt: 100%      Guns:  Locked and loaded.   Social Determinants of Health   Financial Resource Strain:   . Difficulty of Paying Living Expenses: Not on file  Food Insecurity:   . Worried About Charity fundraiser in the Last  Year: Not on file  . Ran Out of Food in the Last Year: Not on file  Transportation Needs:   . Lack of Transportation (Medical): Not on file  . Lack of Transportation (Non-Medical): Not on file  Physical Activity:   . Days of Exercise per Week: Not on file  . Minutes of Exercise per Session: Not on file  Stress:   . Feeling of Stress : Not on file  Social Connections:   . Frequency of Communication with Friends and Family: Not on file  . Frequency of Social Gatherings with Friends and Family: Not on file  . Attends Religious Services: Not on file  . Active Member of Clubs or Organizations: Not on file  . Attends Archivist Meetings: Not on file  . Marital Status: Not on file  Intimate Partner Violence:   . Fear of Current or Ex-Partner: Not on file  . Emotionally Abused: Not on file  . Physically Abused: Not on file  . Sexually Abused: Not on file    Review of Systems 13 point review of systems per patient health survey noted.  Negative other than as indicated above or in HPI.    Objective:   Vitals:   11/30/19 0758  BP: (!) 144/82  Pulse: 69  Temp: 98 F (36.7 C)  TempSrc: Temporal  SpO2: 97%  Weight: 193 lb (87.5 kg)  Height: 5' 7"  (1.702 m)     Physical Exam Vitals reviewed.  Constitutional:      Appearance: He is well-developed.  HENT:     Head: Normocephalic and atraumatic.     Right Ear: External ear normal.     Left Ear: External ear normal.  Eyes:     Conjunctiva/sclera: Conjunctivae normal.     Pupils: Pupils are  equal, round, and reactive to light.  Neck:     Thyroid: No thyromegaly.  Cardiovascular:     Rate and Rhythm: Normal rate and regular rhythm.     Heart sounds: Normal heart sounds.  Pulmonary:     Effort: Pulmonary effort is normal. No respiratory distress.     Breath sounds: Normal breath sounds. No wheezing.  Abdominal:     General: There is no distension.     Palpations: Abdomen is soft.     Tenderness: There is no abdominal tenderness.  Musculoskeletal:        General: No tenderness. Normal range of motion.     Cervical back: Normal range of motion and neck supple.  Lymphadenopathy:     Cervical: No cervical adenopathy.  Skin:    General: Skin is warm and dry.  Neurological:     Mental Status: He is alert and oriented to person, place, and time.     Deep Tendon Reflexes: Reflexes are normal and symmetric.  Psychiatric:        Behavior: Behavior normal.     Assessment & Plan:  JAZZIEL FITZSIMMONS is a 64 y.o. male . Annual physical exam  - -anticipatory guidance as below in AVS, screening labs above. Health maintenance items as above in HPI discussed/recommended as applicable.   - plans to evaluate work situation. Advised to let me know if my assistance needed.   Need for prophylactic vaccination and inoculation against influenza - Plan: Flu Vaccine QUAD 36+ mos IM  Essential hypertension - Plan: Lipid panel, Comprehensive metabolic panel, amLODipine (NORVASC) 5 MG tablet  - borderline. Option of 55m norvasc dosing. Check labs.   Hyperlipidemia, unspecified hyperlipidemia type -  Plan: Lipid panel, Comprehensive metabolic panel, simvastatin (ZOCOR) 20 MG tablet  - tolerating statin - check labs to decide on increase  Screening for diabetes mellitus (DM) - Plan: Hemoglobin A1c Hyperglycemia - Plan: Hemoglobin A1c  - check A1c. Plans on returning to exercise/gym when able based on work demands.   Meds ordered this encounter  Medications  . simvastatin (ZOCOR) 20 MG  tablet    Sig: TAKE 1 TABLET(20 MG) BY MOUTH AT BEDTIME.    Dispense:  90 tablet    Refill:  2  . amLODipine (NORVASC) 5 MG tablet    Sig: Take 1 tablet (5 mg total) by mouth daily.    Dispense:  90 tablet    Refill:  2   Patient Instructions   No changes for now, but if blood pressure remains on the high side, could take 2 of the amlodipine.  Watch for leg swelling if you do use the higher dose.  Thanks for coming in today. Take care.   Keeping you healthy  Get these tests  Blood pressure- Have your blood pressure checked once a year by your healthcare provider.  Normal blood pressure is 120/80  Weight- Have your body mass index (BMI) calculated to screen for obesity.  BMI is a measure of body fat based on height and weight. You can also calculate your own BMI at ViewBanking.si.  Cholesterol- Have your cholesterol checked every year.  Diabetes- Have your blood sugar checked regularly if you have high blood pressure, high cholesterol, have a family history of diabetes or if you are overweight.  Screening for Colon Cancer- Colonoscopy starting at age 59.  Screening may begin sooner depending on your family history and other health conditions. Follow up colonoscopy as directed by your Gastroenterologist.  Screening for Prostate Cancer- Both blood work (PSA) and a rectal exam help screen for Prostate Cancer.  Screening begins at age 45 with African-American men and at age 45 with Caucasian men.  Screening may begin sooner depending on your family history.  Take these medicines  Aspirin- One aspirin daily can help prevent Heart disease and Stroke.  Flu shot- Every fall.  Tetanus- Every 10 years.  Zostavax- Once after the age of 8 to prevent Shingles.  Pneumonia shot- Once after the age of 74; if you are younger than 39, ask your healthcare provider if you need a Pneumonia shot.  Take these steps  Don't smoke- If you do smoke, talk to your doctor about quitting.   For tips on how to quit, go to www.smokefree.gov or call 1-800-QUIT-NOW.  Be physically active- Exercise 5 days a week for at least 30 minutes.  If you are not already physically active start slow and gradually work up to 30 minutes of moderate physical activity.  Examples of moderate activity include walking briskly, mowing the yard, dancing, swimming, bicycling, etc.  Eat a healthy diet- Eat a variety of healthy food such as fruits, vegetables, low fat milk, low fat cheese, yogurt, lean meant, poultry, fish, beans, tofu, etc. For more information go to www.thenutritionsource.org  Drink alcohol in moderation- Limit alcohol intake to less than two drinks a day. Never drink and drive.  Dentist- Brush and floss twice daily; visit your dentist twice a year.  Depression- Your emotional health is as important as your physical health. If you're feeling down, or losing interest in things you would normally enjoy please talk to your healthcare provider.  Eye exam- Visit your eye doctor every year.  Safe sex-  If you may be exposed to a sexually transmitted infection, use a condom.  Seat belts- Seat belts can save your life; always wear one.  Smoke/Carbon Monoxide detectors- These detectors need to be installed on the appropriate level of your home.  Replace batteries at least once a year.  Skin cancer- When out in the sun, cover up and use sunscreen 15 SPF or higher.  Violence- If anyone is threatening you, please tell your healthcare provider.  Living Will/ Health care power of attorney- Speak with your healthcare provider and family.  If you have lab work done today you will be contacted with your lab results within the next 2 weeks.  If you have not heard from [place] then please contact us. The fastest way to get your results is to register for My Chart.   IF you received an x-ray today, you will receive an invoice from [facility]. Please contact [facility] at [phone] with  questions or concerns regarding your invoice.   IF you received labwork today, you will receive an invoice from [place]. Please contact [facility] at [phone] with questions or concerns regarding your invoice.   Our billing staff will not be able to assist you with questions regarding bills from these companies.  You will be contacted with the lab results as soon as they are available. The fastest way to get your results is to activate your My Chart account. Instructions are located on the last page of this paperwork. If you have not heard from [place] regarding the results in 2 weeks, please contact this office.         Signed, [name], MD [facility]

## 2019-12-01 LAB — COMPREHENSIVE METABOLIC PANEL
ALT: 29 IU/L (ref 0–44)
AST: 22 IU/L (ref 0–40)
Albumin/Globulin Ratio: 2.4 — ABNORMAL HIGH (ref 1.2–2.2)
Albumin: 4.7 g/dL (ref 3.8–4.8)
Alkaline Phosphatase: 70 IU/L (ref 48–121)
BUN/Creatinine Ratio: 18 (ref 10–24)
BUN: 18 mg/dL (ref 8–27)
Bilirubin Total: 0.6 mg/dL (ref 0.0–1.2)
CO2: 23 mmol/L (ref 20–29)
Calcium: 9.4 mg/dL (ref 8.6–10.2)
Chloride: 106 mmol/L (ref 96–106)
Creatinine, Ser: 1 mg/dL (ref 0.76–1.27)
GFR calc Af Amer: 92 mL/min/{1.73_m2} (ref >59)
GFR calc non Af Amer: 80 mL/min/{1.73_m2} (ref >59)
Globulin, Total: 2 g/dL (ref 1.5–4.5)
Glucose: 99 mg/dL (ref 65–99)
Potassium: 4.4 mmol/L (ref 3.5–5.2)
Sodium: 142 mmol/L (ref 134–144)
Total Protein: 6.7 g/dL (ref 6.0–8.5)

## 2019-12-01 LAB — LIPID PANEL
Chol/HDL Ratio: 3.2 ratio (ref 0.0–5.0)
Cholesterol, Total: 197 mg/dL (ref 100–199)
HDL: 62 mg/dL (ref >39)
LDL Chol Calc (NIH): 121 mg/dL — ABNORMAL HIGH (ref 0–99)
Triglycerides: 78 mg/dL (ref 0–149)
VLDL Cholesterol Cal: 14 mg/dL (ref 5–40)

## 2019-12-01 LAB — HEMOGLOBIN A1C
Est. average glucose Bld gHb Est-mCnc: 120 mg/dL
Hgb A1c MFr Bld: 5.8 % — ABNORMAL HIGH (ref 4.8–5.6)

## 2020-01-03 ENCOUNTER — Ambulatory Visit: Payer: BC Managed Care – PPO | Admitting: Registered Nurse

## 2020-01-03 ENCOUNTER — Other Ambulatory Visit: Payer: Self-pay

## 2020-01-03 ENCOUNTER — Encounter: Payer: Self-pay | Admitting: Registered Nurse

## 2020-01-03 DIAGNOSIS — M791 Myalgia, unspecified site: Secondary | ICD-10-CM
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
## 2020-01-03 NOTE — Patient Instructions (Signed)
° ° ° °  If you have lab work done today you will be contacted with your lab results within the next 2 weeks.  If you have not heard from us then please contact us. The fastest way to get your results is to register for My Chart. ° ° °IF you received an x-ray today, you will receive an invoice from [facility]. Please contact [facility] at [phone] with questions or concerns regarding your invoice.  ° °IF you received labwork today, you will receive an invoice from [facility]. Please contact [facility] at [phone] with questions or concerns regarding your invoice.  ° °Our billing staff will not be able to assist you with questions regarding bills from these companies. ° °You will be contacted with the lab results as soon as they are available. The fastest way to get your results is to activate your My Chart account. Instructions are located on the last page of this paperwork. If you have not heard from us regarding the results in 2 weeks, please contact this office. °  ° ° ° °

## 2020-02-26 ENCOUNTER — Other Ambulatory Visit: Payer: Self-pay

## 2020-02-26 ENCOUNTER — Telehealth: Payer: BC Managed Care – PPO | Admitting: Family Medicine

## 2020-03-02 DIAGNOSIS — H2513 Age-related nuclear cataract, bilateral: Secondary | ICD-10-CM | POA: Diagnosis not present

## 2020-03-02 DIAGNOSIS — Z135 Encounter for screening for eye and ear disorders: Secondary | ICD-10-CM | POA: Diagnosis not present

## 2020-03-02 DIAGNOSIS — H524 Presbyopia: Secondary | ICD-10-CM | POA: Diagnosis not present

## 2020-03-14 DIAGNOSIS — Z6831 Body mass index (BMI) 31.0-31.9, adult: Secondary | ICD-10-CM | POA: Diagnosis not present

## 2020-03-14 DIAGNOSIS — M0609 Rheumatoid arthritis without rheumatoid factor, multiple sites: Secondary | ICD-10-CM | POA: Diagnosis not present

## 2020-03-14 DIAGNOSIS — M1990 Unspecified osteoarthritis, unspecified site: Secondary | ICD-10-CM | POA: Diagnosis not present

## 2020-03-14 DIAGNOSIS — K5010 Crohn's disease of large intestine without complications: Secondary | ICD-10-CM | POA: Diagnosis not present

## 2020-03-14 DIAGNOSIS — K50818 Crohn's disease of both small and large intestine with other complication: Secondary | ICD-10-CM | POA: Diagnosis not present

## 2020-04-07 ENCOUNTER — Encounter: Payer: Self-pay | Admitting: Family Medicine

## 2020-04-12 ENCOUNTER — Encounter: Payer: Self-pay | Admitting: Family Medicine

## 2020-04-12 ENCOUNTER — Other Ambulatory Visit: Payer: Self-pay

## 2020-04-12 ENCOUNTER — Telehealth (INDEPENDENT_AMBULATORY_CARE_PROVIDER_SITE_OTHER): Payer: No Typology Code available for payment source | Admitting: Family Medicine

## 2020-04-12 VITALS — Ht 66.0 in | Wt 195.0 lb

## 2020-04-12 DIAGNOSIS — Z20822 Contact with and (suspected) exposure to covid-19: Secondary | ICD-10-CM | POA: Diagnosis not present

## 2020-04-12 DIAGNOSIS — R059 Cough, unspecified: Secondary | ICD-10-CM

## 2020-04-12 NOTE — Progress Notes (Signed)
Virtual Visit via Telephone Note  I connected with Phillip Glass on 04/12/20 at 2:03 PM by telephone and verified that I am speaking with the correct person using two identifiers. Patient location: home My location: office.    I discussed the limitations, risks, security and privacy concerns of performing an evaluation and management service by telephone and the availability of in person appointments. I also discussed with the patient that there may be a patient responsible charge related to this service. The patient expressed understanding and agreed to proceed, consent obtained  Chief complaint:  Chief Complaint  Patient presents with  . URI    Pt reports feeling stuffy,cough,fever,and body aches starting last Friday. Pt was tested on Tuesday still waiting on results.    History of Present Illness: Phillip Glass is a 65 y.o. male  Cough, fever, body aches Started last Dec 6th or Friday, December 7. Initially cough, body aches, fever. Fatigue.  Worse after a few days, but now starting to improve. Still coughing. No dyspnea. Just tired. No chest pains - only sore with cough.  No fever, but tylenol.  Drinking fluids. Eating soups.  On Humira every 2 weeks - due tomorrow.   Tx: mucinex DM. Nasal spray - similar to Afrin, once per day past 3 day.   Had a COVID-19 test 3 [place] at mall and waiting on results. Multiple sick contacts at work - multiple employees out [employer]. Definite contact Monday last week.   COVID-vaccine June 2 and September 20, 2019. Has not had booster.  Patient Active Problem List   Diagnosis Date Noted  . Polyarthralgia 01/18/2014  . Colitis 04/08/2012  . Multiple nevi 03/03/2012   Past Medical History:  Diagnosis Date  . Crohn's colitis [facility])    Past Surgical History:  Procedure Laterality Date  . COLONOSCOPY  01/12/2011  . TONSILLECTOMY     Allergies  Allergen Reactions  . Codeine Other (See Comments)    Pt states he hallucinates    Prior to Admission medications   Medication Sig Start Date End Date Taking? Authorizing Provider  Adalimumab 40 MG/0.8ML PNKT Inject 40 mg into the skin. 05/22/15  Yes [provider]  Aloe Vera 500 MG CAPS Take 1 capsule by mouth daily.   Yes [provider]  FOLIC ACID PO Take by mouth daily.   Yes [provider]  Garlic 115 MG TABS Take 1 tablet by mouth daily.   Yes [provider]  methotrexate (RHEUMATREX) 10 MG tablet Take 25 mg by mouth once a week. Caution: Chemotherapy. Protect from light.   Yes [provider]  Multiple Vitamins-Minerals (MULTIVITAMIN WITH MINERALS) tablet Take 1 tablet by mouth daily.   Yes [provider]  simvastatin (ZOCOR) 20 MG tablet TAKE 1 TABLET(20 MG) BY MOUTH AT BEDTIME. 11/30/19  Yes [name], MD  Specialty Vitamins Products (ONE-A-DAY CHOLESTEROL) TABS Take 1 tablet by mouth daily.    Yes [provider]  amLODipine (NORVASC) 5 MG tablet Take 1 tablet (5 mg total) by mouth daily. 11/30/19 02/28/20  [name], MD   Social History   Socioeconomic History  . Marital status: Legally Separated    Spouse name: Not on file  . Number of children: Not on file  . Years of education: Not on file  . Highest education level: Not on file  Occupational History  . Not on file  Tobacco Use  . Smoking status: Never Smoker  . Smokeless  tobacco: Never Used  Vaping Use  . Vaping Use: Never used  Substance and Sexual Activity  . Alcohol use: Not on file  . Drug use: No  . Sexual activity: Not Currently    Birth control/protection: Abstinence  Other Topics Concern  . Not on file  Social History Narrative   Marital status: divorced in 2013.  Not dating.  Married x 20 years.      Children:  1 child (22); no grandchild.      Lives: with son.      Employment: Scientist, clinical (histocompatibility and immunogenetics) x 35 years; happy.      Tobacco; none      Alcohol: none      Drugs; none      Exercise:  6 days per week  for 45 minutes.  Cardio twice weekly; weightlifting 5 days per week.      Seatbelt: 100%      Guns:  Locked and loaded.   Social Determinants of Health   Financial Resource Strain: Not on file  Food Insecurity: Not on file  Transportation Needs: Not on file  Physical Activity: Not on file  Stress: Not on file  Social Connections: Not on file  Intimate Partner Violence: Not on file     Observations/Objective: Vitals:   04/12/20 1139  Weight: 195 lb (88.5 kg)  Height: 5' 6"  (1.676 m)  speaking in full sentences. No respiratory distress.  Coherent responses, all questions were answered with understanding of plan expressed.   Assessment and Plan: Cough  Exposure to COVID-19 virus Suspected COVID-19 infection, test results pending from 3 days ago.  Breakthrough infection with previous vaccination in June, has not yet had booster.  Symptoms are improving, denies dyspnea or chest pain.  Has been taking Tylenol, so we will try off Tylenol for 24 hours and if afebrile along with continued symptomatic improvement can return to work this Monday.  Should wear a mask around others at all times.  ER/urgent care precautions given.  Continue Mucinex, saline nasal spray, fluids.  Stop Afrin nasal spray after today.  He plans on asking his gastroenterologist about Humira dose tomorrow.  Follow Up Instructions:   Precautions as above I discussed the assessment and treatment plan with the patient. The patient was provided an opportunity to ask questions and all were answered. The patient agreed with the plan and demonstrated an understanding of the instructions.   The patient was advised to call back or seek an in-person evaluation if the symptoms worsen or if the condition fails to improve as anticipated.  I provided 12 minutes of non-face-to-face time during this encounter.  Signed,   [name], MD [facility].  04/12/20

## 2020-04-12 NOTE — Patient Instructions (Signed)
° ° ° °  If you have lab work done today you will be contacted with your lab results within the next 2 weeks.  If you have not heard from us then please contact us. The fastest way to get your results is to register for My Chart. ° ° °IF you received an x-ray today, you will receive an invoice from [facility]. Please contact  Radiology at [phone] with questions or concerns regarding your invoice.  ° °IF you received labwork today, you will receive an invoice from [facility]. Please contact [facility] at [phone] with questions or concerns regarding your invoice.  ° °Our billing staff will not be able to assist you with questions regarding bills from these companies. ° °You will be contacted with the lab results as soon as they are available. The fastest way to get your results is to activate your My Chart account. Instructions are located on the last page of this paperwork. If you have not heard from us regarding the results in 2 weeks, please contact this office. °  ° ° ° °

## 2020-05-29 ENCOUNTER — Other Ambulatory Visit: Payer: Self-pay

## 2020-05-29 ENCOUNTER — Ambulatory Visit: Payer: BC Managed Care – PPO | Admitting: Family Medicine

## 2020-05-29 ENCOUNTER — Encounter: Payer: Self-pay | Admitting: Family Medicine

## 2020-05-29 VITALS — BP 136/88 | HR 68 | Temp 98.8°F | Ht 66.0 in | Wt 203.0 lb

## 2020-05-29 DIAGNOSIS — I10 Essential (primary) hypertension: Secondary | ICD-10-CM

## 2020-05-29 DIAGNOSIS — E785 Hyperlipidemia, unspecified: Secondary | ICD-10-CM | POA: Diagnosis not present

## 2020-05-29 DIAGNOSIS — K50919 Crohn's disease, unspecified, with unspecified complications: Secondary | ICD-10-CM | POA: Diagnosis not present

## 2020-05-29 DIAGNOSIS — R7303 Prediabetes: Secondary | ICD-10-CM

## 2020-05-29 MED ORDER — SIMVASTATIN 20 MG PO TABS: ORAL_TABLET | ORAL | 2 refills | Status: DC

## 2020-05-29 MED ORDER — AMLODIPINE BESYLATE 5 MG PO TABS: 5.0000 mg | ORAL_TABLET | Freq: Every day | ORAL | 2 refills | Status: DC

## 2020-05-29 NOTE — Patient Instructions (Addendum)
  Please return for suspected skin tag removal on your right calf.  No med changes for today.  76-monthfollow-up for other medications.  Let me know if there are questions and thank you for coming in today.   If you have lab work done today you will be contacted with your lab results within the next 2 weeks.  If you have not heard from [place] please contact [place] The fastest way to get your results is to register for My Chart.   IF you received an x-ray today, you will [facility] an [facility]. Please contact [facility] at [phone] questions or concerns regarding your invoice.   IF you received labwork today, you will receive an invoice from [place] Please contact [facility] at [phone] questions or concerns regarding your invoice.   Our billing staff will not be able to assist you with questions regarding bills from these companies.  You will be contacted with the lab results as soon as they are available. The fastest way to get your results is to activate your My Chart account. Instructions are located on the last page of this paperwork. If you have not heard from [place] the results in 2 weeks, please contact this office.

## 2020-05-29 NOTE — Progress Notes (Signed)
Subjective:  Patient ID: Phillip Glass, male    DOB: 1956-03-22  Age: 65 y.o. MRN: [id number]  CC:  Chief Complaint  Patient presents with  . Follow-up    On hypertension and hyperlipidemia. PT reports his BP has been running good when he isn't going to work. Pt reports on days he works he noticed his BP is elevated. Pt isn't currently fasting.    HPI Phillip Glass presents for  Hypertension: Amlodipine 42m qd. No new side effects. Home readings: 128/65-138/70. Higher with stress at work.  Still some stress, but has been able to get some time off. 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   BP Readings from Last 3 Encounters:  05/29/20 136/88  01/03/20 136/80  11/30/19 138/82   Lab Results  Component Value Date   CREATININE 1.00 11/30/2019   Prediabetes: No recent increase. Plans to increase exercise.  Lab Results  Component Value Date   HGBA1C 5.8 (H) 11/30/2019   Wt Readings from Last 3 Encounters:  05/29/20 203 lb (92.1 kg)  04/12/20 195 lb (88.5 kg)  01/03/20 197 lb 8 oz (89.6 kg)   Mole: Back of R leg, past 6 months .getting bigger, no pain. Flesh colored. No hx of skin CA.   Hyperlipidemia: zocor 234mqd. No new myalgias.  Lab Results  Component Value Date   CHOL 197 11/30/2019   HDL 62 11/30/2019   LDLCALC 121 (H) 11/30/2019   TRIG 78 11/30/2019   CHOLHDL 3.2 11/30/2019   Lab Results  Component Value Date   ALT 29 11/30/2019   AST 22 11/30/2019   ALKPHOS 70 11/30/2019   BILITOT 0.6 11/30/2019     History Patient Active Problem List   Diagnosis Date Noted  . Polyarthralgia 01/18/2014  . Colitis 04/08/2012  . Multiple nevi 03/03/2012   Past Medical History:  [facility]  [facility]   Past Surgical History:  Procedure Laterality Date  . COLONOSCOPY  01/12/2011  . TONSILLECTOMY     Allergies  Allergen Reactions  . Codeine Other (See Comments)    Pt states he hallucinates   Prior to Admission  medications   Medication Sig Start Date End Date Taking? Authorizing Provider  Adalimumab 40 MG/0.8ML PNKT Inject 40 mg into the skin. 05/22/15  Yes [provider]  Aloe Vera 500 MG CAPS Take 1 capsule by mouth daily.   Yes [provider]  FOLIC ACID PO Take by mouth daily.   Yes [provider]  Garlic 10628G TABS Take 1 tablet by mouth daily.   Yes [provider]  methotrexate (RHEUMATREX) 10 MG tablet Take 25 mg by mouth once a week. Caution: Chemotherapy. Protect from light.   Yes [provider]  Multiple Vitamins-Minerals (MULTIVITAMIN WITH MINERALS) tablet Take 1 tablet by mouth daily.   Yes [provider]  simvastatin (ZOCOR) 20 MG tablet TAKE 1 TABLET(20 MG) BY MOUTH AT BEDTIME. 11/30/19  Yes GrWendie AgresteMD  Specialty Vitamins Products (ONE-A-DAY CHOLESTEROL) TABS Take 1 tablet by mouth daily.    Yes [provider]  amLODipine (NORVASC) 5 MG tablet Take 1 tablet (5 mg total) by mouth daily. 11/30/19 02/28/20  GrWendie AgresteMD   Social History   Socioeconomic History  . Marital status: Legally Separated    Spouse name: Not on file  . Number of children: Not on file  . Years of education: Not on file  .  Highest education level: Not on file  Occupational History  . Not on file  Tobacco Use  . Smoking status: Never Smoker  . Smokeless tobacco: Never Used  Vaping Use  . Vaping Use: Never used  Substance and Sexual Activity  . Alcohol use: Not on file  . Drug use: No  . Sexual activity: Not Currently    Birth control/protection: Abstinence  Other Topics Concern  . Not on file  Social History Narrative   Marital status: divorced in 2013.  Not dating.  Married x 20 years.      Children:  1 child (22); no grandchild.      Lives: with son.      Employment: Scientist, clinical (histocompatibility and immunogenetics) x 35 years; happy.      Tobacco; none      Alcohol: none      Drugs; none      Exercise:  6 days per week for 45 minutes.  Cardio  twice weekly; weightlifting 5 days per week.      Seatbelt: 100%      Guns:  Locked and loaded.   Social Determinants of Health   Financial Resource Strain: Not on file  Food Insecurity: Not on file  Transportation Needs: Not on file  Physical Activity: Not on file  Stress: Not on file  Social Connections: Not on file  Intimate Partner Violence: Not on file    Review of Systems  Constitutional: Negative for fatigue and unexpected weight change.  Eyes: Negative for visual disturbance.  Respiratory: Negative for cough, chest tightness and shortness of breath.   Cardiovascular: Negative for chest pain, palpitations and leg swelling.  Gastrointestinal: Negative for abdominal pain and blood in stool.  Neurological: Negative for dizziness, light-headedness and headaches.     Objective:   Vitals:   05/29/20 0915 05/29/20 0917  BP: (!) 158/85 136/88  Pulse: 68   Temp: 98.8 F (37.1 C)   TempSrc: Temporal   SpO2: 96%   Weight: 203 lb (92.1 kg)   Height: 5' 6"  (1.676 m)      Physical Exam Vitals reviewed.  Constitutional:      Appearance: He is well-developed and well-nourished.  HENT:     Head: Normocephalic and atraumatic.  Eyes:     Extraocular Movements: EOM normal.     Pupils: Pupils are equal, round, and reactive to light.  Neck:     Vascular: No carotid bruit or JVD.  Cardiovascular:     Rate and Rhythm: Normal rate and regular rhythm.     Heart sounds: Normal heart sounds. No murmur heard.   Pulmonary:     Effort: Pulmonary effort is normal.     Breath sounds: Normal breath sounds. No rales.  Musculoskeletal:        General: No edema.  Skin:    General: Skin is warm and dry.     Comments: R proximal calf - 42m skin tag, no erythema. Flesh colored.   Neurological:     Mental Status: He is alert and oriented to person, place, and time.  Psychiatric:        Mood and Affect: Mood and affect normal.    35 minutes spent during visit, greater than 50%  counseling and assimilation of information, chart review, and discussion of plan.    Assessment & Plan:  Phillip DAYWALTis a 65y.o. male . Hyperlipidemia, unspecified hyperlipidemia type - Plan: simvastatin (ZOCOR) 20 MG tablet, Comprehensive metabolic panel, Lipid panel  -  Stable,  tolerating current regimen. Medications refilled. Labs pending as above.   Essential hypertension - Plan: amLODipine (NORVASC) 5 MG tablet, Comprehensive metabolic panel  -  Stable, tolerating current regimen. Medications refilled. Labs pending as above.   Crohn's disease with complication, unspecified gastrointestinal tract location [facility]) - Plan: Ambulatory referral to Rheumatology, Ambulatory referral to Gastroenterology  - refer to prior specialists.   Prediabetes - Plan: Hemoglobin A1c  - check A1c. Watch diet/exercise for weight mgt.   Meds ordered this encounter  Medications  . simvastatin (ZOCOR) 20 MG tablet    Sig: TAKE 1 TABLET(20 MG) BY MOUTH AT BEDTIME.    Dispense:  90 tablet    Refill:  2  . amLODipine (NORVASC) 5 MG tablet    Sig: Take 1 tablet (5 mg total) by mouth daily.    Dispense:  90 tablet    Refill:  2   Patient Instructions    Please return for suspected skin tag removal on your right calf.  No med changes for today.  65-monthfollow-up for other medications.  Let me know if there are questions and thank you for coming in today.   If you have lab work done today you will be contacted with your lab results within the next 2 weeks.  If you have not heard from [place] please contact [place] The fastest way to get your results is to register for My Chart.   IF you received an x-ray today, you will receive an invoice from [facility]. Please contact [facility] at [phone] questions or concerns regarding your invoice.   IF you received labwork today, you will receive an invoice from [place] Please contact [facility] at [phone] questions or  concerns regarding your invoice.   Our billing staff will not be able to assist you with questions regarding bills from these companies.  You will be contacted with the lab results as soon as they are available. The fastest way to get your results is to activate your My Chart account. Instructions are located on the last page of this paperwork. If you have not heard from [place] the results in 2 weeks, please contact this office.         Signed, JMerri Ray MD [facility]

## 2020-05-30 LAB — COMPREHENSIVE METABOLIC PANEL
ALT: 31 IU/L (ref 0–44)
AST: 21 IU/L (ref 0–40)
Albumin/Globulin Ratio: 2.4 — ABNORMAL HIGH (ref 1.2–2.2)
Albumin: 4.6 g/dL (ref 3.8–4.8)
Alkaline Phosphatase: 63 IU/L (ref 44–121)
BUN/Creatinine Ratio: 13 (ref 10–24)
BUN: 15 mg/dL (ref 8–27)
Bilirubin Total: 0.4 mg/dL (ref 0.0–1.2)
CO2: 20 mmol/L (ref 20–29)
Calcium: 9.5 mg/dL (ref 8.6–10.2)
Chloride: 104 mmol/L (ref 96–106)
Creatinine, Ser: 1.12 mg/dL (ref 0.76–1.27)
Globulin, Total: 1.9 g/dL (ref 1.5–4.5)
Glucose: 94 mg/dL (ref 65–99)
Potassium: 4.7 mmol/L (ref 3.5–5.2)
Sodium: 143 mmol/L (ref 134–144)
Total Protein: 6.5 g/dL (ref 6.0–8.5)
eGFR: 73 mL/min/{1.73_m2} (ref >59)

## 2020-05-30 LAB — LIPID PANEL
Chol/HDL Ratio: 3 ratio (ref 0.0–5.0)
Cholesterol, Total: 188 mg/dL (ref 100–199)
HDL: 62 mg/dL (ref >39)
LDL Chol Calc (NIH): 115 mg/dL — ABNORMAL HIGH (ref 0–99)
Triglycerides: 57 mg/dL (ref 0–149)
VLDL Cholesterol Cal: 11 mg/dL (ref 5–40)

## 2020-05-30 LAB — HEMOGLOBIN A1C
Est. average glucose Bld gHb Est-mCnc: 120 mg/dL
Hgb A1c MFr Bld: 5.8 % — ABNORMAL HIGH (ref 4.8–5.6)

## 2020-08-20 ENCOUNTER — Other Ambulatory Visit: Payer: Self-pay | Admitting: Family Medicine

## 2020-08-20 DIAGNOSIS — E785 Hyperlipidemia, unspecified: Secondary | ICD-10-CM

## 2020-09-04 ENCOUNTER — Ambulatory Visit: Payer: No Typology Code available for payment source | Admitting: Family Medicine

## 2020-09-04 ENCOUNTER — Encounter: Payer: Self-pay | Admitting: Family Medicine

## 2020-09-04 ENCOUNTER — Other Ambulatory Visit: Payer: Self-pay

## 2020-09-04 VITALS — BP 126/74 | HR 88 | Temp 98.4°F | Resp 16 | Ht 66.0 in | Wt 212.0 lb

## 2020-09-04 DIAGNOSIS — L918 Other hypertrophic disorders of the skin: Secondary | ICD-10-CM | POA: Diagnosis not present

## 2020-09-04 NOTE — Progress Notes (Signed)
Subjective:  Patient ID: Phillip Glass, male    DOB: Nov 24, 1955  Age: 65 y.o. MRN: [id number]  CC:  Chief Complaint  Patient presents with  . skin tags    Pt reports has skin tags, one that continues to grow on the back of Rt leg, states about the size of a nickel, also get them on his arms and back of his neck would like to discuss prevention and remove the one on his leg.     HPI Phillip Glass presents for   Skin tags Multiple areas, one on the back of the right leg appears to be increasing in size. Gradually increasing in size past 6-40month.   Small area on R arm - anticubital area and left side of neck.  Prior derm years ago. Few benign moles removed in past, no hx of skin CA.    History Patient Active Problem List   Diagnosis Date Noted  . Polyarthralgia 01/18/2014  . Colitis 04/08/2012  . Multiple nevi 03/03/2012   Past Medical History:  Diagnosis Date  . Crohn's colitis ([facility]    Past Surgical History:  Procedure Laterality Date  . COLONOSCOPY  01/12/2011  . TONSILLECTOMY     Allergies  Allergen Reactions  . Codeine Other (See Comments)    Pt states he hallucinates   Prior to Admission medications   Medication Sig Start Date End Date Taking? Authorizing Provider  Adalimumab 40 MG/0.8ML PNKT Inject 40 mg into the skin. 05/22/15  Yes [provider]  Aloe Vera 500 MG CAPS Take 1 capsule by mouth daily.   Yes [provider]  FOLIC ACID PO Take by mouth daily.   Yes [provider]  Garlic 1030MG TABS Take 1 tablet by mouth daily.   Yes [provider]  methotrexate (RHEUMATREX) 10 MG tablet Take 25 mg by mouth once a week. Caution: Chemotherapy. Protect from light.   Yes [provider]  Multiple Vitamins-Minerals (MULTIVITAMIN WITH MINERALS) tablet Take 1 tablet by mouth daily.   Yes [provider]  simvastatin (ZOCOR) 20 MG tablet TAKE 1 TABLET(20 MG) BY MOUTH AT BEDTIME 08/20/20  Yes GWendie Agreste MD  Specialty Vitamins Products (ONE-A-DAY CHOLESTEROL) TABS Take 1 tablet by mouth daily.    Yes [provider]  amLODipine (NORVASC) 5 MG tablet Take 1 tablet (5 mg total) by mouth daily. 05/29/20 08/27/20  GWendie Agreste MD   Social History   Socioeconomic History  . Marital status: Legally Separated    Spouse name: Not on file  . Number of children: Not on file  . Years of education: Not on file  . Highest education level: Not on file  Occupational History  . Not on file  Tobacco Use  . Smoking status: Never Smoker  . Smokeless tobacco: Never Used  Vaping Use  . Vaping Use: Never used  Substance and Sexual Activity  . Alcohol use: Not on file  . Drug use: No  . Sexual activity: Not Currently    Birth control/protection: Abstinence  Other Topics Concern  . Not on file  Social History Narrative   Marital status: divorced in 2013.  Not dating.  Married x 20 years.      Children:  1 child (22); no grandchild.      Lives: with son.      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      Tobacco; none      Alcohol: none  Drugs; none      Exercise:  6 days per week for 45 minutes.  Cardio twice weekly; weightlifting 5 days per week.      Seatbelt: 100%      Guns:  Locked and loaded.   Social Determinants of Health   Financial Resource Strain: Not on file  Food Insecurity: Not on file  Transportation Needs: Not on file  Physical Activity: Not on file  Stress: Not on file  Social Connections: Not on file  Intimate Partner Violence: Not on file    Review of Systems   Objective:   Vitals:   09/04/20 1305  BP: 126/74  Pulse: 88  Resp: 16  Temp: 98.4 F (36.9 C)  TempSrc: Temporal  SpO2: 96%  Weight: 212 lb (96.2 kg)  Height: 5' 6"  (1.676 m)     Physical Exam Vitals reviewed.  Constitutional:      General: He is not in acute distress.    Appearance: He is well-developed.  HENT:     Head: Normocephalic and atraumatic.   Cardiovascular:     Rate and Rhythm: Normal rate.  Pulmonary:     Effort: Pulmonary effort is normal.  Skin:      Neurological:     Mental Status: He is alert and oriented to person, place, and time.      Risks (including but not limited to bleeding and infection), benefits, and alternatives discussed for skin lesion/tag removal R posterior calf.  Verbal consent obtained after any questions were answered. Landmarks noted, and marked as needed. Area cleansed with Betadine,  ethyl chloride spray for topical anesthesia, lifted with forceps and removed with iris scissors.   No complications.  Same procedure for left lateral neck and right arm skin tags.  Bleeding stopped with gentle pressure over the neck and arm areas, initial pressure then silver nitrate x1 to the right calf lesion with cessation of bleeding.  Topical antibiotic with bandage applied to all areas.   RTC precautions discussed.   Assessment & Plan:  Phillip Glass is a 65 y.o. male . Skin tags, multiple acquired - Plan: Surgical pathology  -Excision as above for all 3 skin tags, largest sent for dermatology pathology.  No complications.  Silver nitrate used for cessation of bleeding of the right calf lesion without difficulty.  Bandages applied and aftercare discussed, RTC precautions given  No orders of the defined types were placed in this encounter.  Patient Instructions  I will send the largest skin tag for pathology review, but suspect it is benign. Keep areas clean, covered until healing. Pressure to area for 10 minutes if bleeding.  Marland Kitchen Keep area clean and dry for 24 hours. Do not remove bandage, if applied. . After 24 hours, remove bandage and wash wound gently with mild soap and warm water. Reapply a new bandage after cleaning wound, if directed. . Continue daily cleansing with soap and water  . Notify the office if you experience any of the following signs of infection: Swelling, redness, pus  drainage, streaking, fever >101.0 F  Skin Tag Removal Wound Care instructions  . Sometimes bandages are not necessary.  If a bandage is used, leave the original bandage on for 24 hours if possible.  If the bandage becomes soaked or soiled before that time, it is OK to remove it and examine the wound.  A small amount of post-operative bleeding is normal.  If excessive bleeding occurs, remove the bandage, place gauze over the site and apply  continuous pressure (no peeking) over the area for 20-30 minutes.  If this does not stop the bleeding, try again for 40 minutes.  If this does not work, please call our clinic as soon as possible (even if after hours).    . Once a day, cleanse the wound with soap and water.  If a thick crust develops you may use a Q-tip dipped into dilute hydrogen peroxide (mix 1:1 with water) to dissolve it.  Hydrogen peroxide can slow the healing process, so use it only as needed.  After washing, apply Vaseline jelly or Polysporin ointment.  For best healing, the wound should be covered with a layer of ointment at all times.  This may mean re-applying the ointment several times a day.  For open wounds, continue until it has healed.    . If you have any swelling, keep the area elevated.  . Some redness, tenderness and white or yellow material in the wound is normal healing.  If the area becomes very sore and red, or develops a thick yellow-green material (pus), it may be infected; please notify us.    . Wound healing continues for up to one year following surgery.  It is not unusual to experience pain in the scar from time to time during the interval.  If the pain becomes severe or the scar thickens, you should notify the office.  A slight amount of redness in a scar is expected for the first six months.  After six months, the redness subsides and the scar will soften and fade.  The color difference becomes less noticeable with time.  If there are any problems, return for a post-op  surgery check at your earliest convenience.  . It is important to wear sunscreen daily with an SPF of 30 or higher over the treated sites and to wear sun-protective clothing.  . Please call our office for any questions or concerns.       Signed, [name], MD [facility]

## 2020-09-04 NOTE — Patient Instructions (Signed)
I will send the largest skin tag for pathology review, but suspect it is benign. Keep areas clean, covered until healing. Pressure to area for 10 minutes if bleeding.  Marland Kitchen Keep area clean and dry for 24 hours. Do not remove bandage, if applied. . After 24 hours, remove bandage and wash wound gently with mild soap and warm water. Reapply a new bandage after cleaning wound, if directed. . Continue daily cleansing with soap and water  . Notify the office if you experience any of the following signs of infection: Swelling, redness, pus drainage, streaking, fever >101.0 F  Skin Tag Removal Wound Care instructions  . Sometimes bandages are not necessary.  If a bandage is used, leave the original bandage on for 24 hours if possible.  If the bandage becomes soaked or soiled before that time, it is OK to remove it and examine the wound.  A small amount of post-operative bleeding is normal.  If excessive bleeding occurs, remove the bandage, place gauze over the site and apply continuous pressure (no peeking) over the area for 20-30 minutes.  If this does not stop the bleeding, try again for 40 minutes.  If this does not work, please call our clinic as soon as possible (even if after hours).    . Once a day, cleanse the wound with soap and water.  If a thick crust develops you may use a Q-tip dipped into dilute hydrogen peroxide (mix 1:1 with water) to dissolve it.  Hydrogen peroxide can slow the healing process, so use it only as needed.  After washing, apply Vaseline jelly or Polysporin ointment.  For best healing, the wound should be covered with a layer of ointment at all times.  This may mean re-applying the ointment several times a day.  For open wounds, continue until it has healed.    . If you have any swelling, keep the area elevated.  . Some redness, tenderness and white or yellow material in the wound is normal healing.  If the area becomes very sore and red, or develops a thick yellow-green  material (pus), it may be infected; please notify us.    . Wound healing continues for up to one year following surgery.  It is not unusual to experience pain in the scar from time to time during the interval.  If the pain becomes severe or the scar thickens, you should notify the office.  A slight amount of redness in a scar is expected for the first six months.  After six months, the redness subsides and the scar will soften and fade.  The color difference becomes less noticeable with time.  If there are any problems, return for a post-op surgery check at your earliest convenience.  . It is important to wear sunscreen daily with an SPF of 30 or higher over the treated sites and to wear sun-protective clothing.  . Please call our office for any questions or concerns.

## 2020-09-05 ENCOUNTER — Other Ambulatory Visit: Payer: Self-pay

## 2020-09-05 ENCOUNTER — Other Ambulatory Visit (HOSPITAL_COMMUNITY)
Admission: RE | Admit: 2020-09-05 | Discharge: 2020-09-05 | Disposition: A | Discharge status: Home / Self Care | Payer: No Typology Code available for payment source | Source: Ambulatory Visit | Attending: Family Medicine | Admitting: Family Medicine

## 2020-09-05 DIAGNOSIS — L918 Other hypertrophic disorders of the skin: Secondary | ICD-10-CM

## 2020-09-05 NOTE — Addendum Note (Signed)
Addended by: Doran Clay A on: 09/05/2020 02:46 PM   Modules accepted: Orders

## 2020-09-09 LAB — SURGICAL PATHOLOGY

## 2021-02-03 ENCOUNTER — Ambulatory Visit (INDEPENDENT_AMBULATORY_CARE_PROVIDER_SITE_OTHER): Payer: No Typology Code available for payment source | Admitting: Family Medicine

## 2021-02-03 DIAGNOSIS — Z23 Encounter for immunization: Secondary | ICD-10-CM

## 2021-03-14 DIAGNOSIS — Z6829 Body mass index (BMI) 29.0-29.9, adult: Secondary | ICD-10-CM | POA: Diagnosis not present

## 2021-03-14 DIAGNOSIS — M0609 Rheumatoid arthritis without rheumatoid factor, multiple sites: Secondary | ICD-10-CM | POA: Diagnosis not present

## 2021-03-14 DIAGNOSIS — M79672 Pain in left foot: Secondary | ICD-10-CM | POA: Diagnosis not present

## 2021-03-14 DIAGNOSIS — K50818 Crohn's disease of both small and large intestine with other complication: Secondary | ICD-10-CM | POA: Diagnosis not present

## 2021-03-22 ENCOUNTER — Other Ambulatory Visit: Payer: Self-pay | Admitting: Family Medicine

## 2021-03-22 DIAGNOSIS — I10 Essential (primary) hypertension: Secondary | ICD-10-CM

## 2021-05-02 ENCOUNTER — Other Ambulatory Visit: Payer: Self-pay

## 2021-05-02 ENCOUNTER — Ambulatory Visit (INDEPENDENT_AMBULATORY_CARE_PROVIDER_SITE_OTHER): Payer: Medicare Other | Admitting: Registered Nurse

## 2021-05-02 VITALS — BP 124/75 | HR 65 | Temp 98.3°F | Resp 18 | Ht 66.0 in | Wt 185.4 lb

## 2021-05-02 DIAGNOSIS — L989 Disorder of the skin and subcutaneous tissue, unspecified: Secondary | ICD-10-CM

## 2021-05-02 NOTE — Patient Instructions (Addendum)
[name] to see you  We got some drainage - but this may be one that recurs. I will refer to derm if that's the case.   Call if it starts to look infected  Thank you  [name]     If you have lab work done today you will be contacted with your lab results within the next 2 weeks.  If you have not heard from [place] then please contact us. The fastest way to get your results is to register for My Chart.   IF you received an x-ray today, you will receive an invoice from [facility]. Please contact [facility] at [phone] with questions or concerns regarding your invoice.   IF you received labwork today, you will receive an invoice from [place]. Please contact [facility] at [phone] with questions or concerns regarding your invoice.   Our billing staff will not be able to assist you with questions regarding bills from these companies.  You will be contacted with the lab results as soon as they are available. The fastest way to get your results is to activate your My Chart account. Instructions are located on the last page of this paperwork. If you have not heard from [place] regarding the results in 2 weeks, please contact this office.

## 2021-05-06 NOTE — Progress Notes (Signed)
Established Patient Office Visit  Subjective:  Patient ID: Phillip Glass, male    DOB: 1955/12/06  Age: 66 y.o. MRN: [id number]  CC:  Chief Complaint  Patient presents with   Mass    Patient states he has a bump on his back since 03/30/2021. Patient states it was the size os a nickel now a size of a quarter now.     HPI Phillip Glass presents for lump on back  Has been present since 03/30/21 - size of a nickel, now the size of a quarter Noted by massage therapist who suggested he get it checked out No pain. Not fixed. No drainage Has had cysts previously, seems similar, but he has a tough time seeing this or palpating.  No other concerns.   Past Medical History:  Diagnosis Date   Crohn's colitis [facility])     Past Surgical History:  Procedure Laterality Date   COLONOSCOPY  01/12/2011   TONSILLECTOMY      Family History  Problem Relation Age of Onset   Hypertension Father    Heart disease Father 19       AMI age 67; second  AMI age 39 cause of death   Diabetes Sister    Dementia Mother    Hypertension Mother     Social History   Socioeconomic History   Marital status: Legally Separated    Spouse name: Not on file   Number of children: Not on file   Years of education: Not on file   Highest education level: Not on file  Occupational History   Not on file  Tobacco Use   Smoking status: Never   Smokeless tobacco: Never  Vaping Use   Vaping Use: Never used  Substance and Sexual Activity   Alcohol use: Not on file   Drug use: No   Sexual activity: Not Currently    Birth control/protection: Abstinence  Other Topics Concern   Not on file  Social History Narrative   Marital status: divorced in 2013.  Not dating.  Married x 20 years.      Children:  1 child (22); no grandchild.      Lives: with son.      Employment: Scientist, clinical (histocompatibility and immunogenetics) x 35 years; happy.      Tobacco; none      Alcohol: none      Drugs; none      Exercise:  6 days per week for 45 minutes.   Cardio twice weekly; weightlifting 5 days per week.      Seatbelt: 100%      Guns:  Locked and loaded.   Social Determinants of Health   Financial Resource Strain: Not on file  Food Insecurity: Not on file  Transportation Needs: Not on file  Physical Activity: Not on file  Stress: Not on file  Social Connections: Not on file  Intimate Partner Violence: Not on file    Outpatient Medications Prior to Visit  Medication Sig Dispense Refill   Adalimumab 40 MG/0.8ML PNKT Inject 40 mg into the skin.     Aloe Vera 500 MG CAPS Take 1 capsule by mouth daily.     amLODipine (NORVASC) 5 MG tablet TAKE 1 TABLET(5 MG) BY MOUTH DAILY 90 tablet 2   FOLIC ACID PO Take by mouth daily.     Garlic 364 MG TABS Take 1 tablet by mouth daily.     methotrexate (RHEUMATREX) 10 MG tablet Take 25 mg by mouth once a week. Caution:  Chemotherapy. Protect from light.     Multiple Vitamins-Minerals (MULTIVITAMIN WITH MINERALS) tablet Take 1 tablet by mouth daily.     simvastatin (ZOCOR) 20 MG tablet TAKE 1 TABLET(20 MG) BY MOUTH AT BEDTIME 90 tablet 2   Specialty Vitamins Products (ONE-A-DAY CHOLESTEROL) TABS Take 1 tablet by mouth daily.      No facility-administered medications prior to visit.    Allergies  Allergen Reactions   Codeine Other (See Comments)    Pt states he hallucinates    ROS Review of Systems Per hpi     Objective:    Physical Exam Constitutional:      General: He is not in acute distress.    Appearance: Normal appearance. He is normal weight. He is not ill-appearing, toxic-appearing or diaphoretic.  Cardiovascular:     Rate and Rhythm: Normal rate and regular rhythm.     Heart sounds: Normal heart sounds. No murmur heard.   No friction rub. No gallop.  Pulmonary:     Effort: Pulmonary effort is normal. No respiratory distress.     Breath sounds: Normal breath sounds. No stridor. No wheezing, rhonchi or rales.  Chest:     Chest wall: No tenderness.  Skin:    Findings:  Lesion (soft, well circumscribed lesion midline mid back. no apparent head or drainage. no redness or warmth) present.  Neurological:     General: No focal deficit present.     Mental Status: He is alert and oriented to person, place, and time. Mental status is at baseline.  Psychiatric:        Mood and Affect: Mood normal.        Behavior: Behavior normal.        Thought Content: Thought content normal.        Judgment: Judgment normal.    BP 124/75    Pulse 65    Temp 98.3 F (36.8 C) (Temporal)    Resp 18    Ht 5' 6"  (1.676 m)    Wt 185 lb 6.4 oz (84.1 kg)    SpO2 99%    BMI 29.92 kg/m  Wt Readings from Last 3 Encounters:  05/02/21 185 lb 6.4 oz (84.1 kg)  09/04/20 212 lb (96.2 kg)  05/29/20 203 lb (92.1 kg)     Health Maintenance Due  Topic Date Due   Zoster Vaccines- Shingrix (1 of 2) Never done   Pneumonia Vaccine 19+ Years old (2 - PCV) 12/10/2016   COVID-19 Vaccine (3 - Pfizer risk series) 10/18/2019    There are no preventive care reminders to display for this patient.  Lab Results  Component Value Date   TSH 2.260 12/22/2018   Lab Results  Component Value Date   WBC 5.4 06/24/2017   HGB 15.1 06/24/2017   HCT 45.5 06/24/2017   MCV 102 (H) 06/24/2017   PLT 274 06/24/2017   Lab Results  Component Value Date   NA 143 05/29/2020   K 4.7 05/29/2020   CO2 20 05/29/2020   GLUCOSE 94 05/29/2020   BUN 15 05/29/2020   CREATININE 1.12 05/29/2020   BILITOT 0.4 05/29/2020   ALKPHOS 63 05/29/2020   AST 21 05/29/2020   ALT 31 05/29/2020   PROT 6.5 05/29/2020   ALBUMIN 4.6 05/29/2020   CALCIUM 9.5 05/29/2020   EGFR 73 05/29/2020   Lab Results  Component Value Date   CHOL 188 05/29/2020   Lab Results  Component Value Date   HDL 62 05/29/2020   Lab Results  Component Value Date   LDLCALC 115 (H) 05/29/2020   Lab Results  Component Value Date   TRIG 57 05/29/2020   Lab Results  Component Value Date   CHOLHDL 3.0 05/29/2020   Lab Results  Component  Value Date   HGBA1C 5.8 (H) 05/29/2020   Procedure I&d of apparent sebaceous cyst. Discussed risks, benefits, alternatives with patient who voices understanding Informed consent obtained and signed by pt with witness.  Area prepped with betadine and alcohol prep pad. 1% lidocaine injected, 1/2 cc #11 scalpel used to create 58m incision at site of cyst Small amount of curd like drainage removed Firm area remaining  Covered with cause, heal by secondary intention Pt tolerated procedure very well.     Assessment & Plan:   Problem List Items Addressed This Visit   None Visit Diagnoses     Skin lesion    -  Primary   Relevant Orders   Ambulatory referral to Dermatology       No orders of the defined types were placed in this encounter.   Follow-up: No follow-ups on file.   PLAN Question of lipoma or other benign lesion in area. Will refer to dermatology to have this removed. Reviewed reasons to return to clinic including symptoms of infection Patient encouraged to call clinic with any questions, comments, or concerns.  RMaximiano Coss NP

## 2021-05-12 ENCOUNTER — Ambulatory Visit: Payer: Self-pay | Admitting: Family Medicine

## 2021-05-20 ENCOUNTER — Other Ambulatory Visit: Payer: Self-pay | Admitting: Family Medicine

## 2021-05-20 DIAGNOSIS — E785 Hyperlipidemia, unspecified: Secondary | ICD-10-CM

## 2021-06-12 DIAGNOSIS — Z79631 Long term (current) use of antimetabolite agent: Secondary | ICD-10-CM | POA: Diagnosis not present

## 2021-06-12 DIAGNOSIS — M0609 Rheumatoid arthritis without rheumatoid factor, multiple sites: Secondary | ICD-10-CM | POA: Diagnosis not present

## 2021-06-12 DIAGNOSIS — Z79620 Long term (current) use of immunosuppressive biologic: Secondary | ICD-10-CM | POA: Diagnosis not present

## 2021-06-12 DIAGNOSIS — K5010 Crohn's disease of large intestine without complications: Secondary | ICD-10-CM | POA: Diagnosis not present

## 2021-06-26 DIAGNOSIS — L720 Epidermal cyst: Secondary | ICD-10-CM | POA: Diagnosis not present

## 2021-07-24 DIAGNOSIS — Z1283 Encounter for screening for malignant neoplasm of skin: Secondary | ICD-10-CM | POA: Diagnosis not present

## 2021-07-24 DIAGNOSIS — D485 Neoplasm of uncertain behavior of skin: Secondary | ICD-10-CM | POA: Diagnosis not present

## 2021-07-24 DIAGNOSIS — D225 Melanocytic nevi of trunk: Secondary | ICD-10-CM | POA: Diagnosis not present

## 2021-08-30 IMAGING — DX DG WRIST COMPLETE 3+V*R*
4 series · 4 of 4 positions shown · non-contrast
Comparison: Radiographs September 03, 2010.

CLINICAL DATA: Right wrist pain after fall.

EXAM:
RIGHT WRIST - COMPLETE 3+ VIEW

[wrist pa]
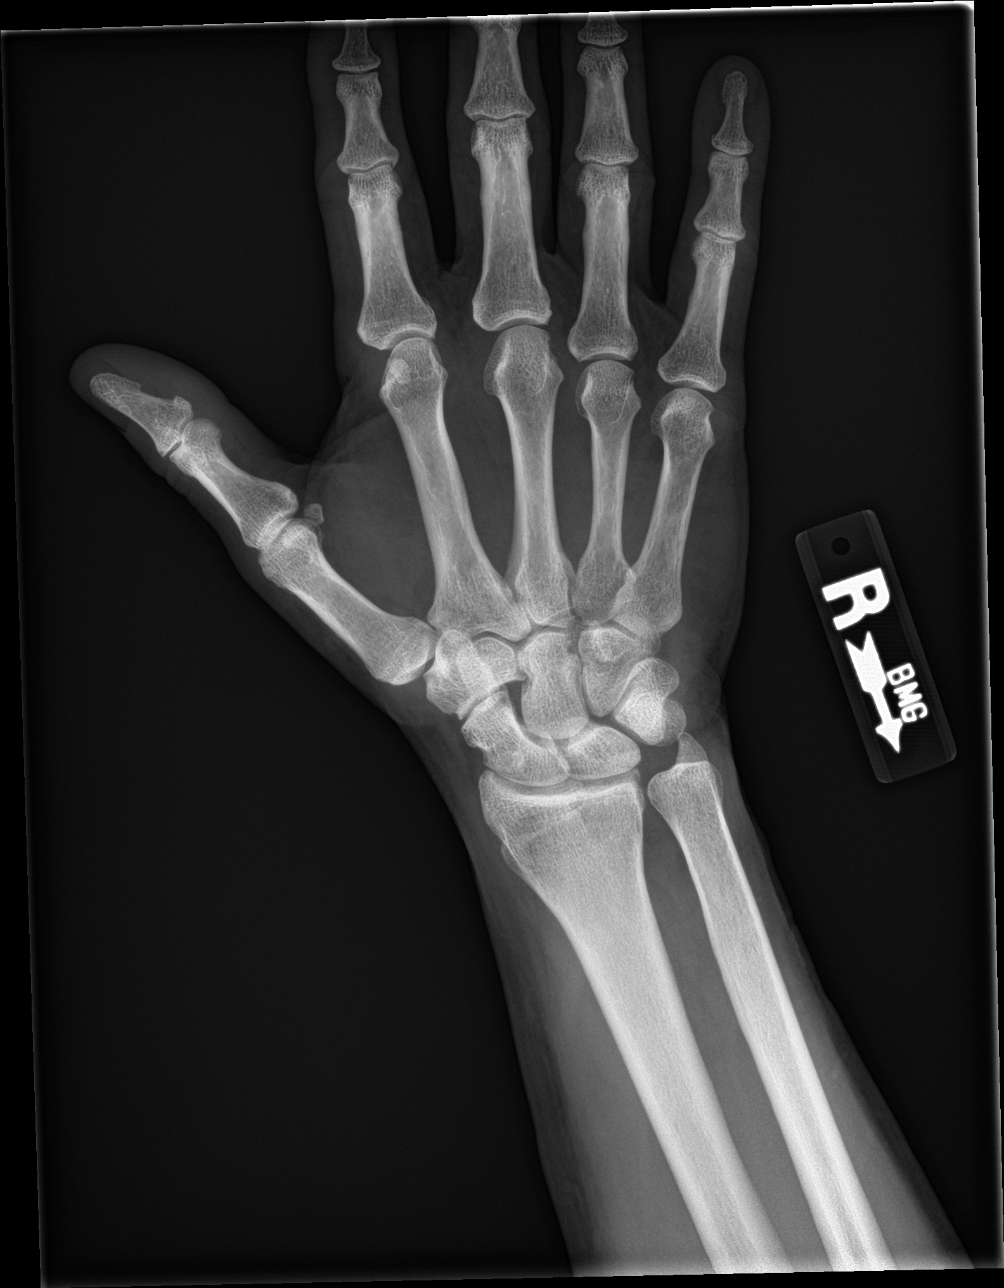

[wrist obl]
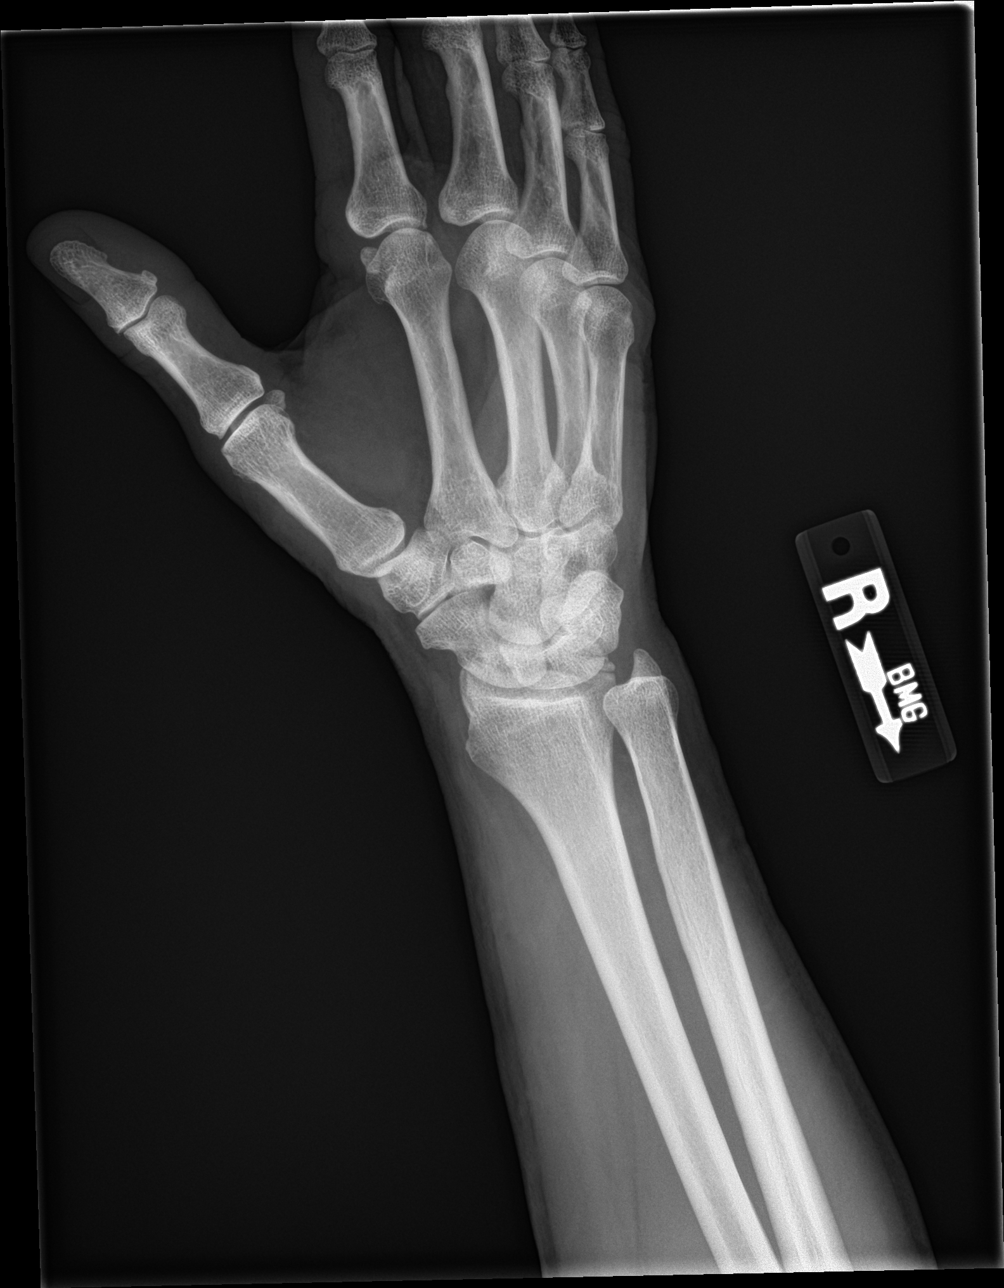

[wrist lat]
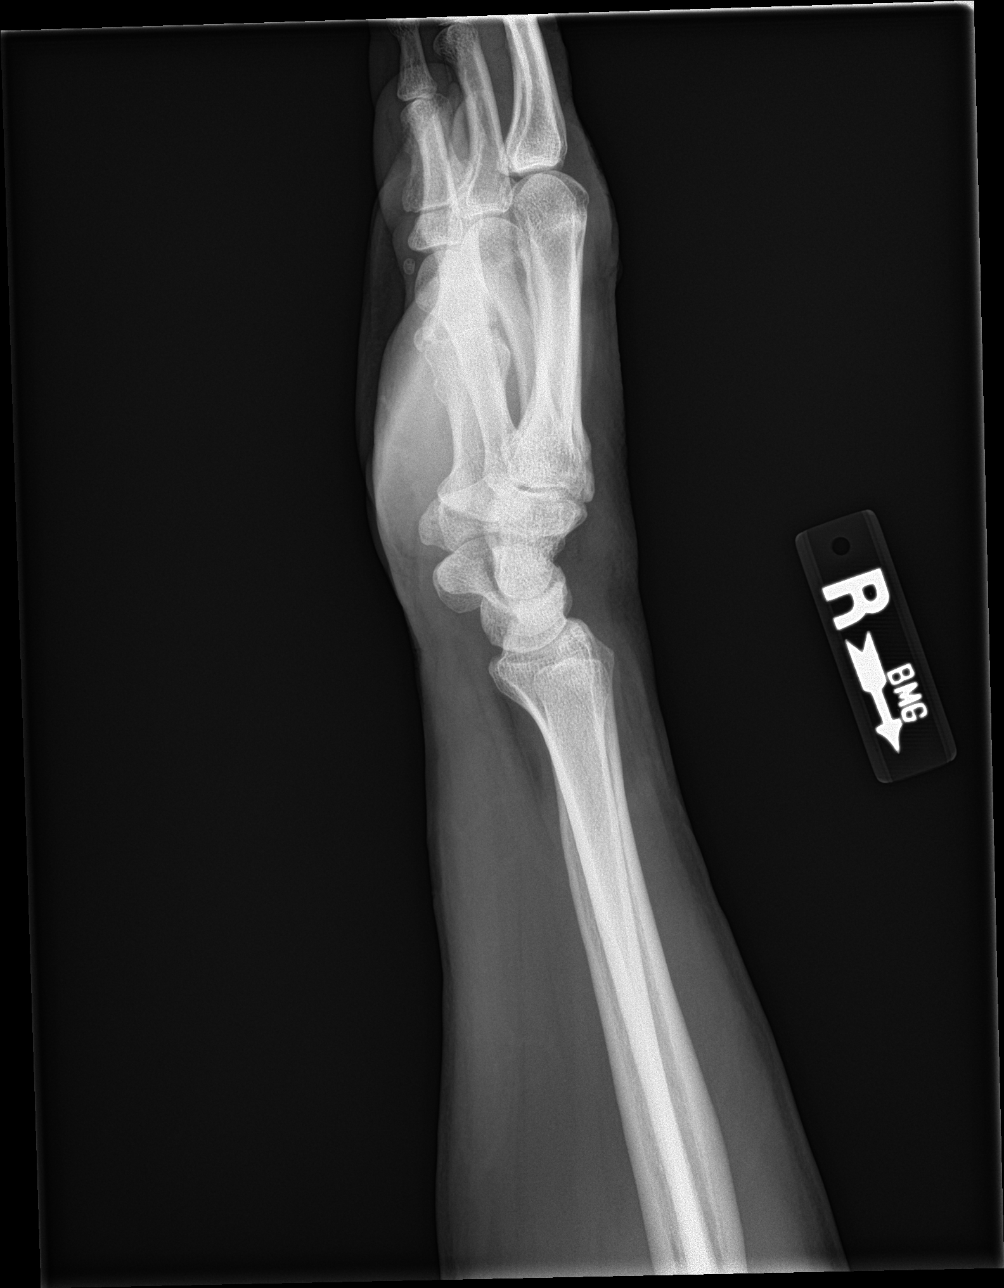

[wrist navicular]
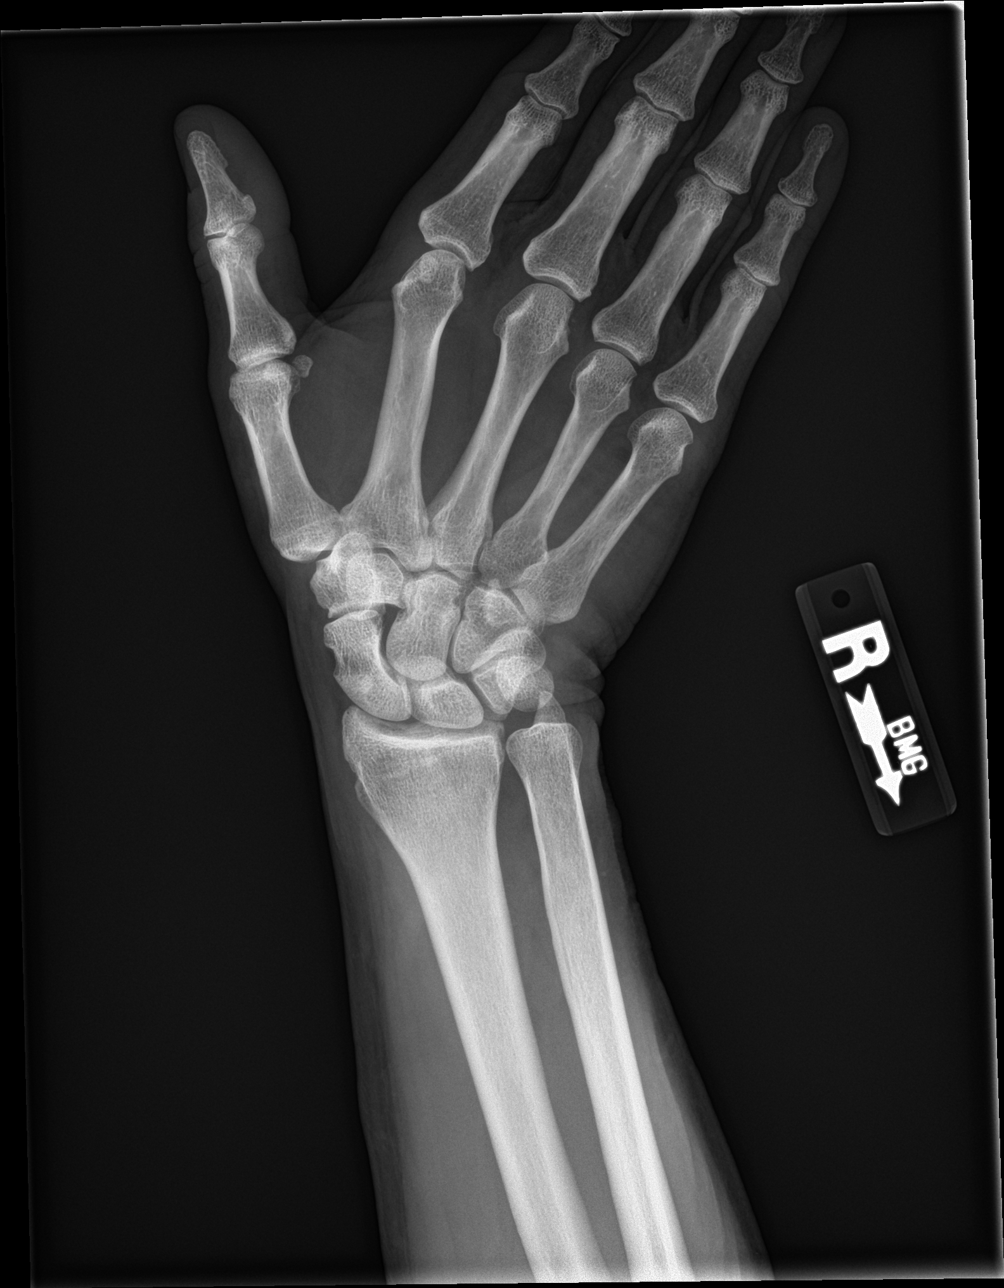

[4 of 4 positions shown; findings below may reference images not displayed]

FINDINGS: There is no evidence of fracture or dislocation. There is no
evidence of arthropathy or other focal bone abnormality. Soft
tissues are unremarkable.
IMPRESSION: Negative.

## 2021-09-04 ENCOUNTER — Ambulatory Visit (INDEPENDENT_AMBULATORY_CARE_PROVIDER_SITE_OTHER): Payer: Medicare Other | Admitting: Family Medicine

## 2021-09-04 ENCOUNTER — Encounter: Payer: Self-pay | Admitting: Family Medicine

## 2021-09-04 VITALS — BP 130/76 | HR 60 | Temp 98.2°F | Resp 16 | Ht 66.0 in | Wt 166.2 lb

## 2021-09-04 DIAGNOSIS — Z0000 Encounter for general adult medical examination without abnormal findings: Secondary | ICD-10-CM

## 2021-09-04 DIAGNOSIS — E785 Hyperlipidemia, unspecified: Secondary | ICD-10-CM | POA: Diagnosis not present

## 2021-09-04 DIAGNOSIS — Z23 Encounter for immunization: Secondary | ICD-10-CM | POA: Diagnosis not present

## 2021-09-04 DIAGNOSIS — R739 Hyperglycemia, unspecified: Secondary | ICD-10-CM | POA: Diagnosis not present

## 2021-09-04 DIAGNOSIS — I10 Essential (primary) hypertension: Secondary | ICD-10-CM | POA: Diagnosis not present

## 2021-09-04 LAB — COMPREHENSIVE METABOLIC PANEL
ALT: 18 U/L (ref 0–53)
AST: 17 U/L (ref 0–37)
Albumin: 4.6 g/dL (ref 3.5–5.2)
Alkaline Phosphatase: 57 U/L (ref 39–117)
BUN: 18 mg/dL (ref 6–23)
CO2: 29 mEq/L (ref 19–32)
Calcium: 9.5 mg/dL (ref 8.4–10.5)
Chloride: 102 mEq/L (ref 96–112)
Creatinine, Ser: 1.09 mg/dL (ref 0.40–1.50)
GFR: 71.25 mL/min (ref >60.00)
Glucose, Bld: 93 mg/dL (ref 70–99)
Potassium: 4 mEq/L (ref 3.5–5.1)
Sodium: 140 mEq/L (ref 135–145)
Total Bilirubin: 0.7 mg/dL (ref 0.2–1.2)
Total Protein: 7.1 g/dL (ref 6.0–8.3)

## 2021-09-04 LAB — LIPID PANEL
Cholesterol: 165 mg/dL (ref 0–200)
HDL: 63.1 mg/dL (ref >39.00)
LDL Cholesterol: 89 mg/dL (ref 0–99)
NonHDL: 102.12
Total CHOL/HDL Ratio: 3
Triglycerides: 65 mg/dL (ref 0.0–149.0)
VLDL: 13 mg/dL (ref 0.0–40.0)

## 2021-09-04 LAB — HEMOGLOBIN A1C: Hgb A1c MFr Bld: 5.7 % (ref 4.6–6.5)

## 2021-09-04 MED ORDER — SIMVASTATIN 20 MG PO TABS: ORAL_TABLET | ORAL | 2 refills | Status: DC

## 2021-09-04 MED ORDER — AMLODIPINE BESYLATE 5 MG PO TABS: ORAL_TABLET | ORAL | 2 refills | Status: DC

## 2021-09-04 NOTE — Patient Instructions (Addendum)
Shingrix option at your pharmacy.  Other pneumonia vaccine at next visit - 59moto 1 year.  No med changes at this time.  Keep up the good work with diet/exercise! I expect labs to look better.  Take care!   Preventive Care 623Years and Older, Male Preventive care refers to lifestyle choices and visits with your health care provider that can promote health and wellness. Preventive care visits are also called wellness exams. What can I expect for my preventive care visit? Counseling During your preventive care visit, your health care provider may ask about your: Medical history, including: Past medical problems. Family medical history. History of falls. Current health, including: Emotional well-being. Home life and relationship well-being. Sexual activity. Memory and ability to understand (cognition). Lifestyle, including: Alcohol, nicotine or tobacco, and drug use. Access to firearms. Diet, exercise, and sleep habits. Work and work eStatistician Sunscreen use. Safety issues such as seatbelt and bike helmet use. Physical exam Your health care provider will check your: Height and weight. These may be used to calculate your BMI (body mass index). BMI is a measurement that tells if you are at a healthy weight. Waist circumference. This measures the distance around your waistline. This measurement also tells if you are at a healthy weight and may help predict your risk of certain diseases, such as type 2 diabetes and high blood pressure. Heart rate and blood pressure. Body temperature. Skin for abnormal spots. What immunizations do I need?  Vaccines are usually given at various ages, according to a schedule. Your health care provider will recommend vaccines for you based on your age, medical history, and lifestyle or other factors, such as travel or where you work. What tests do I need? Screening Your health care provider may recommend screening tests for certain conditions. This may  include: Lipid and cholesterol levels. Diabetes screening. This is done by checking your blood sugar (glucose) after you have not eaten for a while (fasting). Hepatitis C test. Hepatitis B test. HIV (human immunodeficiency virus) test. STI (sexually transmitted infection) testing, if you are at risk. Lung cancer screening. Colorectal cancer screening. Prostate cancer screening. Abdominal aortic aneurysm (AAA) screening. You may need this if you are a current or former smoker. Talk with your health care provider about your test results, treatment options, and if necessary, the need for more tests. Follow these instructions at home: Eating and drinking  Eat a diet that includes fresh fruits and vegetables, whole grains, lean protein, and low-fat dairy products. Limit your intake of foods with high amounts of sugar, saturated fats, and salt. Take vitamin and mineral supplements as recommended by your health care provider. Do not drink alcohol if your health care provider tells you not to drink. If you drink alcohol: Limit how much you have to 0-2 drinks a day. Know how much alcohol is in your drink. In the [place] one drink equals one 12 oz bottle of beer (355 mL), one 5 oz glass of wine (148 mL), or one 1 oz glass of hard liquor (44 mL). Lifestyle Brush your teeth every morning and night with fluoride toothpaste. Floss one time each day. Exercise for at least 30 minutes 5 or more days each week. Do not use any products that contain nicotine or tobacco. These products include cigarettes, chewing tobacco, and vaping devices, such as e-cigarettes. If you need help quitting, ask your health care provider. Do not use drugs. If you are sexually active, practice safe sex. Use a condom or other  form of protection to prevent STIs. Take aspirin only as told by your health care provider. Make sure that you understand how much to take and what form to take. Work with your health care provider to find out  whether it is safe and beneficial for you to take aspirin daily. Ask your health care provider if you need to take a cholesterol-lowering medicine (statin). Find healthy ways to manage stress, such as: Meditation, yoga, or listening to music. Journaling. Talking to a trusted person. Spending time with friends and family. Safety Always wear your seat belt while driving or riding in a vehicle. Do not drive: If you have been drinking alcohol. Do not ride with someone who has been drinking. When you are tired or distracted. While texting. If you have been using any mind-altering substances or drugs. Wear a helmet and other protective equipment during sports activities. If you have firearms in your house, make sure you follow all gun safety procedures. Minimize exposure to UV radiation to reduce your risk of skin cancer. What's next? Visit your health care provider once a year for an annual wellness visit. Ask your health care provider how often you should have your eyes and teeth checked. Stay up to date on all vaccines. This information is not intended to replace advice given to you by your health care provider. Make sure you discuss any questions you have with your health care provider. Document Revised: 09/11/2020 Document Reviewed: 09/11/2020 Elsevier Patient Education  [place].

## 2021-09-04 NOTE — Progress Notes (Signed)
Subjective:  Patient ID: Phillip Glass, male    DOB: 1955/05/03  Age: 66 y.o. MRN: [id number]  CC:  Chief Complaint  Patient presents with   Annual Exam    Pt here for annual, no concerns, he is fasting for lab work today     HPI ANTIONO ETTINGER presents for Annual Exam  Care team PCP -me Rheumatology Dr. Amil Amen, RA treated with methotrexate, adalimumab  gastroenterology Dr. Nyoka Cowden at [place],  Crohn's disease.  Continued Humira, methotrexate, folic acid at his March 16 visit. Labs in 6 months, but followed by Dr. Amil Amen with every 3 months.    Hypertension: Amlodipine 5 mg daily. Working ok. No new side effects Home readings: 125/69 last night.  Constitutional: Negative for fatigue and unexpected weight change - intentional weight loss with more exercise, diet.   Eyes: Negative for visual disturbance.  Respiratory: Negative for cough, chest tightness and shortness of breath.   Cardiovascular: Negative for chest pain, palpitations and leg swelling.  Gastrointestinal: Negative for abdominal pain and blood in stool. Neurological: Negative for dizziness, light-headedness and headaches.     BP Readings from Last 3 Encounters:  09/04/21 130/76  05/02/21 124/75  09/04/20 126/74   Lab Results  Component Value Date   CREATININE 1.12 05/29/2020   Hyperlipidemia: Simvastatin 20 mg daily, fasting today. No new side effects. Feels better than he has in 10 years.  Getting ready to retire from [employer] soon. 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  Lab Results  Component Value Date   CHOL 188 05/29/2020   HDL 62 05/29/2020   LDLCALC 115 (H) 05/29/2020   TRIG 57 05/29/2020   CHOLHDL 3.0 05/29/2020   Lab Results  Component Value Date   ALT 31 05/29/2020   AST 21 05/29/2020   ALKPHOS 63 05/29/2020   BILITOT 0.4 05/29/2020    Prediabetes: Exercise/diet changes above.  Lab Results  Component  Value Date   HGBA1C 5.8 (H) 05/29/2020   Wt Readings from Last 3 Encounters:  09/04/21 166 lb 3.2 oz (75.4 kg)  05/02/21 185 lb 6.4 oz (84.1 kg)  09/04/20 212 lb (96.2 kg)       09/04/2021    9:30 AM 05/02/2021    4:03 PM 09/04/2020    1:08 PM 05/29/2020    9:16 AM 04/12/2020   11:40 AM  Depression screen PHQ 2/9  Decreased Interest 0 0 0 0 0  Down, Depressed, Hopeless 0 0 0 0 0  PHQ - 2 Score 0 0 0 0 0  Altered sleeping  0 0    Tired, decreased energy  0 0    Change in appetite  0 0    Feeling bad or failure about yourself   0 0    Trouble concentrating  0 0    Moving slowly or fidgety/restless  0 0    Suicidal thoughts  0 0    PHQ-9 Score  0 0    Difficult doing work/chores  Not difficult at all       Health Maintenance  Topic Date Due   Pneumonia Vaccine 58+ Years old (2 - PCV) 12/10/2016   COVID-19 Vaccine (3 - Pfizer risk series) 09/20/2021 (Originally 10/18/2019)   Zoster Vaccines- Shingrix (1 of 2) 12/05/2021 (Originally 03/27/1975)   INFLUENZA VACCINE  10/28/2021   COLONOSCOPY (Pts 45-34yr Insurance coverage will need to be confirmed)  10/24/2024  TETANUS/TDAP  06/25/2027   Hepatitis C Screening  Completed   HIV Screening  Completed   HPV VACCINES  Aged Out  Colonoscopy March 2021.  Repeat 10 years per notes from gastroenterology. Prostate: does NOT have family history of prostate cancer The natural history of prostate cancer and ongoing controversy regarding screening and potential treatment outcomes of prostate cancer has been discussed with the patient. The meaning of a false positive PSA and a false negative PSA has been discussed. He indicates understanding of the limitations of this screening test and wishes NOT to proceed with screening PSA testing. Lab Results  Component Value Date   PSA1 0.9 06/24/2017   PSA 2.92 03/03/2012  Referred to dermatology in February.  Dr. Nevada Crane, appt in March. No concnerns on bx - follow up in 4 months.    Immunization History   Administered Date(s) Administered   Influenza,inj,Quad PF,6+ Mos 01/22/2014, 01/18/2015, 01/29/2016, 01/08/2017, 01/15/2018, 12/22/2018, 11/30/2019, 02/03/2021   PFIZER(Purple Top)SARS-COV-2 Vaccination 08/30/2019, 09/20/2019   Pneumococcal Polysaccharide-23 12/11/2015   Tdap 06/24/2017  Prevnar today. Plan on repeat pneumovax in 70mo11yr.  Covid vaccine: had primary vaccine, no booster, declines booster, had covid 3 times. Mild courses.  Shingrix: disease few years ago. Option at pharmacy.   No results found. Wears glasses, appt with optho 02/2021. No eye disease.   Dental:Yes and Within Last 6 months good report last time.   Alcohol: rare - 1/week.   Tobacco: none.  Exercise: increased - weightlifting, strength training 4-5days per week. As well as at least 262m per day cardio, and jiu jitsu. Around 23070mper week exercise.    History Patient Active Problem List   Diagnosis Date Noted   Polyarthralgia 01/18/2014   Colitis 04/08/2012   Multiple nevi 03/03/2012   Past Medical History:  [facility]   [facility]  Past Surgical History:  Procedure Laterality Date   COLONOSCOPY  01/12/2011   TONSILLECTOMY     Allergies  Allergen Reactions   Codeine Other (See Comments)    Pt states he hallucinates   Prior to Admission medications   Medication Sig Start Date End Date Taking? Authorizing Provider  Adalimumab 40 MG/0.8ML PNKT Inject 40 mg into the skin. 05/22/15  Yes [provider]  Aloe Vera 500 MG CAPS Take 1 capsule by mouth daily.   Yes [provider]  amLODipine (NORVASC) 5 MG tablet TAKE 1 TABLET(5 MG) BY MOUTH DAILY 03/25/21  Yes GreWendie AgresteD  FOLIC ACID PO Take by mouth daily.   Yes [provider]  Garlic 100259 TABS Take 1 tablet by mouth daily.   Yes [provider]  methotrexate (RHEUMATREX) 10 MG tablet Take 25 mg by mouth once a week. Caution: Chemotherapy. Protect from light.   Yes [provider]  Multiple Vitamins-Minerals (MULTIVITAMIN WITH MINERALS) tablet Take 1 tablet by mouth daily.   Yes [provider]  simvastatin (ZOCOR) 20 MG tablet TAKE 1 TABLET(20 MG) BY MOUTH AT BEDTIME 05/21/21  Yes GreWendie AgresteD  Specialty Vitamins Products (ONE-A-DAY CHOLESTEROL) TABS Take 1 tablet by mouth daily.    Yes [provider]   Social History   Socioeconomic History   Marital status: Legally Separated    Spouse name: Not on file   Number of children: Not on file   Years of education: Not on file   Highest education level: Not on file  Occupational History   Not on file  Tobacco Use  Smoking status: Never   Smokeless tobacco: Never  Vaping Use   Vaping Use: Never used  Substance and Sexual Activity   Alcohol use: Not Currently    Alcohol/week: 1.0 standard drink of alcohol    Types: 1 Cans of beer per week   Drug use: No   Sexual activity: Not Currently    Birth control/protection: Abstinence  Other Topics Concern   Not on file  Social History Narrative   Marital status: divorced in 2013.  Not dating.  Married x 20 years.      Children:  1 child (22); no grandchild.      Lives: with son.      Employment: Scientist, clinical (histocompatibility and immunogenetics) x 35 years; happy.      Tobacco; none      Alcohol: none      Drugs; none      Exercise:  6 days per week for 45 minutes.  Cardio twice weekly; weightlifting 5 days per week.      Seatbelt: 100%      Guns:  Locked and loaded.   Social Determinants of Health   Financial Resource Strain: Not on file  Food Insecurity: Not on file  Transportation Needs: Not on file  Physical Activity: Not on file  Stress: Not on file  Social Connections: Not on file  Intimate Partner Violence: Not on file    Review of Systems 13 point review of systems per patient health survey noted.  Negative other than as indicated above or in HPI.    Objective:   Vitals:   09/04/21 0927  BP: 130/76  Pulse: 60  Resp: 16  Temp:  98.2 F (36.8 C)  TempSrc: Temporal  SpO2: 96%  Weight: 166 lb 3.2 oz (75.4 kg)  Height: 5' 6"  (1.676 m)     Physical Exam Vitals reviewed.  Constitutional:      Appearance: He is well-developed.  HENT:     Head: Normocephalic and atraumatic.     Right Ear: External ear normal.     Left Ear: External ear normal.     Ears:     Comments: Excess cerumen on R canal, not impacted.  Eyes:     Conjunctiva/sclera: Conjunctivae normal.     Pupils: Pupils are equal, round, and reactive to light.  Neck:     Thyroid: No thyromegaly.  Cardiovascular:     Rate and Rhythm: Normal rate and regular rhythm.     Heart sounds: Normal heart sounds.  Pulmonary:     Effort: Pulmonary effort is normal. No respiratory distress.     Breath sounds: Normal breath sounds. No wheezing.  Abdominal:     General: There is no distension.     Palpations: Abdomen is soft.     Tenderness: There is no abdominal tenderness.  Musculoskeletal:        General: No tenderness. Normal range of motion.     Cervical back: Normal range of motion and neck supple.  Lymphadenopathy:     Cervical: No cervical adenopathy.  Skin:    General: Skin is warm and dry.  Neurological:     Mental Status: He is alert and oriented to person, place, and time.     Deep Tendon Reflexes: Reflexes are normal and symmetric.  Psychiatric:        Behavior: Behavior normal.        Assessment & Plan:  JATHAN BALLING is a 66 y.o. male . Annual physical exam - Plan: Comprehensive metabolic panel,  Lipid panel, Hemoglobin A1c  - -anticipatory guidance as below in AVS, screening labs above. Health maintenance items as above in HPI discussed/recommended as applicable.   Essential hypertension - Plan: Comprehensive metabolic panel, amLODipine (NORVASC) 5 MG tablet  -  Stable, tolerating current regimen. Medications refilled. Labs pending as above.   Hyperlipidemia, unspecified hyperlipidemia type - Plan: Lipid panel, simvastatin  (ZOCOR) 20 MG tablet  -  Stable, tolerating current regimen. Medications refilled. Labs pending as above.   Hyperglycemia - Plan: Hemoglobin A1c  -anticipate improvement, commended on diet/exercise changes. Check A1c.   Need for pneumonia vaccine.  - Plan: Pneumococcal conjugate vaccine 13-valent Plan for pneumovax in 6-70month, prevnar today.   Meds ordered this encounter  Medications   amLODipine (NORVASC) 5 MG tablet    Sig: TAKE 1 TABLET(5 MG) BY MOUTH DAILY    Dispense:  90 tablet    Refill:  2   simvastatin (ZOCOR) 20 MG tablet    Sig: TAKE 1 TABLET(20 MG) BY MOUTH AT BEDTIME    Dispense:  90 tablet    Refill:  2   Patient Instructions  Shingrix option at your pharmacy.  Other pneumonia vaccine at next visit - 634moo 1 year.  No med changes at this time.  Keep up the good work with diet/exercise! I expect labs to look better.    Preventive Care 6534ears and Older, Male Preventive care refers to lifestyle choices and visits with your health care provider that can promote health and wellness. Preventive care visits are also called wellness exams. What can I expect for my preventive care visit? Counseling During your preventive care visit, your health care provider may ask about your: Medical history, including: Past medical problems. Family medical history. History of falls. Current health, including: Emotional well-being. Home life and relationship well-being. Sexual activity. Memory and ability to understand (cognition). Lifestyle, including: Alcohol, nicotine or tobacco, and drug use. Access to firearms. Diet, exercise, and sleep habits. Work and work enStatisticianSunscreen use. Safety issues such as seatbelt and bike helmet use. Physical exam Your health care provider will check your: Height and weight. These may be used to calculate your BMI (body mass index). BMI is a measurement that tells if you are at a healthy weight. Waist circumference. This  measures the distance around your waistline. This measurement also tells if you are at a healthy weight and may help predict your risk of certain diseases, such as type 2 diabetes and high blood pressure. Heart rate and blood pressure. Body temperature. Skin for abnormal spots. What immunizations do I need?  Vaccines are usually given at various ages, according to a schedule. Your health care provider will recommend vaccines for you based on your age, medical history, and lifestyle or other factors, such as travel or where you work. What tests do I need? Screening Your health care provider may recommend screening tests for certain conditions. This may include: Lipid and cholesterol levels. Diabetes screening. This is done by checking your blood sugar (glucose) after you have not eaten for a while (fasting). Hepatitis C test. Hepatitis B test. HIV (human immunodeficiency virus) test. STI (sexually transmitted infection) testing, if you are at risk. Lung cancer screening. Colorectal cancer screening. Prostate cancer screening. Abdominal aortic aneurysm (AAA) screening. You may need this if you are a current or former smoker. Talk with your health care provider about your test results, treatment options, and if necessary, the need for more tests. Follow these instructions at home: Eating  and drinking  Eat a diet that includes fresh fruits and vegetables, whole grains, lean protein, and low-fat dairy products. Limit your intake of foods with high amounts of sugar, saturated fats, and salt. Take vitamin and mineral supplements as recommended by your health care provider. Do not drink alcohol if your health care provider tells you not to drink. If you drink alcohol: Limit how much you have to 0-2 drinks a day. Know how much alcohol is in your drink. In the [place] one drink equals one 12 oz bottle of beer (355 mL), one 5 oz glass of wine (148 mL), or one 1 oz glass of hard liquor (44  mL). Lifestyle Brush your teeth every morning and night with fluoride toothpaste. Floss one time each day. Exercise for at least 30 minutes 5 or more days each week. Do not use any products that contain nicotine or tobacco. These products include cigarettes, chewing tobacco, and vaping devices, such as e-cigarettes. If you need help quitting, ask your health care provider. Do not use drugs. If you are sexually active, practice safe sex. Use a condom or other form of protection to prevent STIs. Take aspirin only as told by your health care provider. Make sure that you understand how much to take and what form to take. Work with your health care provider to find out whether it is safe and beneficial for you to take aspirin daily. Ask your health care provider if you need to take a cholesterol-lowering medicine (statin). Find healthy ways to manage stress, such as: Meditation, yoga, or listening to music. Journaling. Talking to a trusted person. Spending time with friends and family. Safety Always wear your seat belt while driving or riding in a vehicle. Do not drive: If you have been drinking alcohol. Do not ride with someone who has been drinking. When you are tired or distracted. While texting. If you have been using any mind-altering substances or drugs. Wear a helmet and other protective equipment during sports activities. If you have firearms in your house, make sure you follow all gun safety procedures. Minimize exposure to UV radiation to reduce your risk of skin cancer. What's next? Visit your health care provider once a year for an annual wellness visit. Ask your health care provider how often you should have your eyes and teeth checked. Stay up to date on all vaccines. This information is not intended to replace advice given to you by your health care provider. Make sure you discuss any questions you have with your health care provider. Document Revised: 09/11/2020 Document  Reviewed: 09/11/2020 Elsevier Patient Education  [place],   [name], MD [place], [facility] 09/04/21 10:18 AM

## 2021-09-12 DIAGNOSIS — K50818 Crohn's disease of both small and large intestine with other complication: Secondary | ICD-10-CM | POA: Diagnosis not present

## 2021-09-12 DIAGNOSIS — M79672 Pain in left foot: Secondary | ICD-10-CM | POA: Diagnosis not present

## 2021-09-12 DIAGNOSIS — Z6826 Body mass index (BMI) 26.0-26.9, adult: Secondary | ICD-10-CM | POA: Diagnosis not present

## 2021-09-12 DIAGNOSIS — M0609 Rheumatoid arthritis without rheumatoid factor, multiple sites: Secondary | ICD-10-CM | POA: Diagnosis not present

## 2021-12-04 DIAGNOSIS — K5010 Crohn's disease of large intestine without complications: Secondary | ICD-10-CM | POA: Diagnosis not present

## 2021-12-09 DIAGNOSIS — M0609 Rheumatoid arthritis without rheumatoid factor, multiple sites: Secondary | ICD-10-CM | POA: Diagnosis not present

## 2021-12-18 DIAGNOSIS — Z1211 Encounter for screening for malignant neoplasm of colon: Secondary | ICD-10-CM | POA: Diagnosis not present

## 2021-12-18 DIAGNOSIS — K5010 Crohn's disease of large intestine without complications: Secondary | ICD-10-CM | POA: Diagnosis not present

## 2021-12-18 DIAGNOSIS — Z8379 Family history of other diseases of the digestive system: Secondary | ICD-10-CM | POA: Diagnosis not present

## 2021-12-18 DIAGNOSIS — Z79620 Long term (current) use of immunosuppressive biologic: Secondary | ICD-10-CM | POA: Diagnosis not present

## 2021-12-19 ENCOUNTER — Ambulatory Visit (INDEPENDENT_AMBULATORY_CARE_PROVIDER_SITE_OTHER): Payer: Medicare Other | Admitting: Family Medicine

## 2021-12-19 DIAGNOSIS — Z23 Encounter for immunization: Secondary | ICD-10-CM | POA: Diagnosis not present

## 2021-12-19 NOTE — Progress Notes (Signed)
Pt received flu vaccine today Tolerated vaccine well

## 2022-02-11 ENCOUNTER — Other Ambulatory Visit: Payer: Self-pay | Admitting: Family Medicine

## 2022-02-11 DIAGNOSIS — E785 Hyperlipidemia, unspecified: Secondary | ICD-10-CM

## 2022-03-05 ENCOUNTER — Ambulatory Visit: Payer: Medicare Other | Admitting: Family Medicine

## 2022-03-12 ENCOUNTER — Ambulatory Visit (INDEPENDENT_AMBULATORY_CARE_PROVIDER_SITE_OTHER): Payer: Medicare Other | Admitting: Family Medicine

## 2022-03-12 ENCOUNTER — Encounter: Payer: Self-pay | Admitting: Family Medicine

## 2022-03-12 VITALS — BP 130/78 | HR 61 | Temp 98.2°F | Ht 66.0 in | Wt 166.2 lb

## 2022-03-12 DIAGNOSIS — E785 Hyperlipidemia, unspecified: Secondary | ICD-10-CM

## 2022-03-12 DIAGNOSIS — R7303 Prediabetes: Secondary | ICD-10-CM | POA: Diagnosis not present

## 2022-03-12 DIAGNOSIS — I10 Essential (primary) hypertension: Secondary | ICD-10-CM | POA: Diagnosis not present

## 2022-03-12 DIAGNOSIS — N529 Male erectile dysfunction, unspecified: Secondary | ICD-10-CM

## 2022-03-12 DIAGNOSIS — Z135 Encounter for screening for eye and ear disorders: Secondary | ICD-10-CM | POA: Diagnosis not present

## 2022-03-12 DIAGNOSIS — H524 Presbyopia: Secondary | ICD-10-CM | POA: Diagnosis not present

## 2022-03-12 LAB — LIPID PANEL
Cholesterol: 167 mg/dL (ref 0–200)
HDL: 65.5 mg/dL (ref >39.00)
LDL Cholesterol: 90 mg/dL (ref 0–99)
NonHDL: 101.08
Total CHOL/HDL Ratio: 3
Triglycerides: 55 mg/dL (ref 0.0–149.0)
VLDL: 11 mg/dL (ref 0.0–40.0)

## 2022-03-12 LAB — HEMOGLOBIN A1C: Hgb A1c MFr Bld: 5.8 % (ref 4.6–6.5)

## 2022-03-12 MED ORDER — SILDENAFIL CITRATE 100 MG PO TABS: 50.0000 mg | ORAL_TABLET | Freq: Every day | ORAL | 5 refills | Status: DC | PRN

## 2022-03-12 MED ORDER — AMLODIPINE BESYLATE 5 MG PO TABS: ORAL_TABLET | ORAL | 2 refills | Status: DC

## 2022-03-12 NOTE — Patient Instructions (Addendum)
See information below, but can try lowest effective dose of sildenafil for now.  No other med changes.  Shingles vaccine, COVID-vaccine should be available at your pharmacy.  Follow-up in 6 months, I will let you know if any concerns on labs.  Take care!  Erectile Dysfunction Erectile dysfunction (ED) is the inability to get or keep an erection in order to have sexual intercourse. ED is considered a symptom of an underlying disorder and is not considered a disease. ED may include: Inability to get an erection. Lack of enough hardness of the erection to allow penetration. Loss of erection before sex is finished. What are the causes? This condition may be caused by: Physical causes, such as: Artery problems. This may include heart disease, high blood pressure, atherosclerosis, and diabetes. Hormonal problems, such as low testosterone. Obesity. Nerve problems. This may include back or pelvic injuries, multiple sclerosis, Parkinson's disease, spinal cord injury, and stroke. Certain medicines, such as: Pain relievers. Antidepressants. Blood pressure medicines and water pills (diuretics). Cancer medicines. Antihistamines. Muscle relaxants. Lifestyle factors, such as: Use of drugs such as marijuana, cocaine, or opioids. Excessive use of alcohol. Smoking. Lack of physical activity or exercise. Psychological causes, such as: Anxiety or stress. Sadness or depression. Exhaustion. Fear about sexual performance. Guilt. What are the signs or symptoms? Symptoms of this condition include: Inability to get an erection. Lack of enough hardness of the erection to allow penetration. Loss of the erection before sex is finished. Sometimes having normal erections, but with frequent unsatisfactory episodes. Low sexual satisfaction in either partner due to erection problems. A curved penis occurring with erection. The curve may cause pain, or the penis may be too curved to allow for  intercourse. Never having nighttime or morning erections. How is this diagnosed? This condition is often diagnosed by: Performing a physical exam to find other diseases or specific problems with the penis. Asking you detailed questions about the problem. Doing tests, such as: Blood tests to check for diabetes mellitus or high cholesterol, or to measure hormone levels. Other tests to check for underlying health conditions. An ultrasound exam to check for scarring. A test to check blood flow to the penis. Doing a sleep study at home to measure nighttime erections. How is this treated? This condition may be treated by: Medicines, such as: Medicine taken by mouth to help you achieve an erection (oral medicine). Hormone replacement therapy to replace low testosterone levels. Medicine that is injected into the penis. Your health care provider may instruct you how to give yourself these injections at home. Medicine that is delivered with a short applicator tube. The tube is inserted into the opening at the tip of the penis, which is the opening of the urethra. A tiny pellet of medicine is put in the urethra. The pellet dissolves and enhances erectile function. This is also called MUSE (medicated urethral system for erections) therapy. Vacuum pump. This is a pump with a ring on it. The pump and ring are placed on the penis and used to create pressure that helps the penis become erect. Penile implant surgery. In this procedure, you may receive: An inflatable implant. This consists of cylinders, a pump, and a reservoir. The cylinders can be inflated with a fluid that helps to create an erection, and they can be deflated after intercourse. A semi-rigid implant. This consists of two silicone rubber rods. The rods provide some rigidity. They are also flexible, so the penis can both curve downward in its normal position and  become straight for sexual intercourse. Blood vessel surgery to improve blood flow  to the penis. During this procedure, a blood vessel from a different part of the body is placed into the penis to allow blood to flow around (bypass) damaged or blocked blood vessels. Lifestyle changes, such as exercising more, losing weight, and quitting smoking. Follow these instructions at home: Medicines  Take over-the-counter and prescription medicines only as told by your health care provider. Do not increase the dosage without first discussing it with your health care provider. If you are using self-injections, do injections as directed by your health care provider. Make sure you avoid any veins that are on the surface of the penis. After giving an injection, apply pressure to the injection site for 5 minutes. Talk to your health care provider about how to prevent headaches while taking ED medicines. These medicines may cause a sudden headache due to the increase in blood flow in your body. General instructions Exercise regularly, as directed by your health care provider. Work with your health care provider to lose weight, if needed. Do not use any products that contain nicotine or tobacco. These products include cigarettes, chewing tobacco, and vaping devices, such as e-cigarettes. If you need help quitting, ask your health care provider. Before using a vacuum pump, read the instructions that come with the pump and discuss any questions with your health care provider. Keep all follow-up visits. This is important. Contact a health care provider if: You feel nauseous. You are vomiting. You get sudden headaches while taking ED medicines. You have any concerns about your sexual health. Get help right away if: You are taking oral or injectable medicines and you have an erection that lasts longer than 4 hours. If your health care provider is unavailable, go to the nearest emergency room for evaluation. An erection that lasts much longer than 4 hours can result in permanent damage to your  penis. You have severe pain in your groin or abdomen. You develop redness or severe swelling of your penis. You have redness spreading at your groin or lower abdomen. You are unable to urinate. You experience chest pain or a rapid heartbeat (palpitations) after taking oral medicines. These symptoms may represent a serious problem that is an emergency. Do not wait to see if the symptoms will go away. Get medical help right away. Call your local emergency services (911 in the [place] Do not drive yourself to the hospital. Summary Erectile dysfunction (ED) is the inability to get or keep an erection during sexual intercourse. This condition is diagnosed based on a physical exam, your symptoms, and tests to determine the cause. Treatment varies depending on the cause and may include medicines, hormone therapy, surgery, or a vacuum pump. You may need follow-up visits to make sure that you are using your medicines or devices correctly. Get help right away if you are taking or injecting medicines and you have an erection that lasts longer than 4 hours. This information is not intended to replace advice given to you by your health care provider. Make sure you discuss any questions you have with your health care provider. Document Revised: 06/12/2020 Document Reviewed: 06/12/2020 Elsevier Patient Education  [place].

## 2022-03-12 NOTE — Progress Notes (Signed)
Subjective:  Patient ID: Phillip Glass, male    DOB: 1955/04/18  Age: 66 y.o. MRN: [id number]  CC:  Chief Complaint  Patient presents with   Hyperlipidemia   Hypertension    Pt states all is well   Sexual Problem    Pt states his drive isnt what it used to be     HPI Phillip Glass presents for   Follow-up.  He is followed by rheumatology and gastroenterology with history of polyarthralgia, Crohn's disease.  Monitoring through specialists for use of Humira, methotrexate, folate. New offer at work - more vacation, more support. Better situation. More managers. Working much less. Exercise daily - few hours. 2 days off.   Hypertension: Amlodipine 5 mg daily. Home readings:125/68. No new side effects. No leg swelling.  BP Readings from Last 3 Encounters:  03/12/22 130/78  09/04/21 130/76  05/02/21 124/75   Lab Results  Component Value Date   CREATININE 1.09 09/04/2021   Hyperlipidemia: Simvastatin 20 mg daily. No new myalgias/side effects.  Normal LFTs on outside labs September 7 through [facility] Lab Results  Component Value Date   CHOL 165 09/04/2021   HDL 63.10 09/04/2021   LDLCALC 89 09/04/2021   TRIG 65.0 09/04/2021   CHOLHDL 3 09/04/2021   Lab Results  Component Value Date   ALT 18 09/04/2021   AST 17 09/04/2021   ALKPHOS 57 09/04/2021   BILITOT 0.7 09/04/2021   Prediabetes: Borderline A1c in June.  Stable, improved from 5.8 previously. Lab Results  Component Value Date   HGBA1C 5.7 09/04/2021   Wt Readings from Last 3 Encounters:  03/12/22 166 lb 3.2 oz (75.4 kg)  09/04/21 166 lb 3.2 oz (75.4 kg)  05/02/21 185 lb 6.4 oz (84.1 kg)   Erectile dysfunction. Past year. New girlfriend past year. Off and on prior. Trouble obtaining erection at times. Sometimes maintaining erection difficult.  No CP with exercise.   Health maintenance Shingles vaccine, had shingles infection few years ago, recommended vaccine at his pharmacy.  Also plans on having  COVID updated booster at his pharmacy.  History Patient Active Problem List   Diagnosis Date Noted   Polyarthralgia 01/18/2014   Crohn's disease of colon without complication ([place]) 05/39/7673   Multiple nevi 03/03/2012   Past Medical History:  Diagnosis Date   Crohn's colitis [facility])    Past Surgical History:  Procedure Laterality Date   COLONOSCOPY  01/12/2011   TONSILLECTOMY     Allergies  Allergen Reactions   Codeine Other (See Comments)    Pt states he hallucinates   Corylus Hives   Prior to Admission medications   Medication Sig Start Date End Date Taking? Authorizing Provider  Adalimumab (HUMIRA PEN) 40 MG/0.4ML PNKT INJECT 40MG SUBCUTANEOUSLY  EVERY 2 WEEKS 02/28/21  Yes [provider]  Adalimumab 40 MG/0.8ML PNKT Inject 40 mg into the skin. 05/22/15  Yes [provider]  Aloe Vera 500 MG CAPS Take 1 capsule by mouth daily.   Yes [provider]  amLODipine (NORVASC) 5 MG tablet TAKE 1 TABLET(5 MG) BY MOUTH DAILY 09/04/21  Yes [name], MD  FOLIC ACID PO Take by mouth daily.   Yes [provider]  Garlic 419 MG TABS Take 1 tablet by mouth daily.   Yes [provider]  methotrexate (RHEUMATREX) 10 MG tablet Take 25 mg by mouth once a week. Caution: Chemotherapy. Protect from light.   Yes [provider]  methotrexate (RHEUMATREX) 5 MG tablet  Take by mouth. 12/15/21  Yes [provider]  Multiple Vitamins-Minerals (MULTIVITAMIN WITH MINERALS) tablet Take 1 tablet by mouth daily.   Yes [provider]  simvastatin (ZOCOR) 20 MG tablet TAKE 1 TABLET(20 MG) BY MOUTH AT BEDTIME 02/11/22  Yes [name], MD  simvastatin (ZOCOR) 20 MG tablet 1 tablet (20 mg total) nightly. 05/21/21  Yes [provider]  Specialty Vitamins Products (ONE-A-DAY CHOLESTEROL) TABS Take 1 tablet by mouth daily.    Yes [provider]  amLODipine (NORVASC) 5 MG tablet Take 1 tablet by mouth daily.     [provider]  folic acid (FOLVITE) 1 MG tablet Take 1 mg by mouth daily.    [provider]  GARLIC PO Take by mouth.    [provider]  methotrexate (RHEUMATREX) 2.5 MG tablet Take by mouth.    [provider]  Multiple Vitamin (MULTI-VITAMIN) tablet Take 1 tablet by mouth daily.    [provider]   Social History   Socioeconomic History   Marital status: Divorced    Spouse name: Not on file   Number of children: Not on file   Years of education: Not on file   Highest education level: Not on file  Occupational History   Not on file  Tobacco Use   Smoking status: Never   Smokeless tobacco: Never  Vaping Use   Vaping Use: Never used  Substance and Sexual Activity   Alcohol use: Not Currently    Alcohol/week: 1.0 standard drink of alcohol    Types: 1 Cans of beer per week   Drug use: No   Sexual activity: Not Currently    Birth control/protection: Abstinence  Other Topics Concern   Not on file  Social History Narrative   Marital status: divorced in 2013.  Not dating.  Married x 20 years.      Children:  1 child (22); no grandchild.      Lives: with son.      Employment: Scientist, clinical (histocompatibility and immunogenetics) x 35 years; happy.      Tobacco; none      Alcohol: none      Drugs; none      Exercise:  6 days per week for 45 minutes.  Cardio twice weekly; weightlifting 5 days per week.      Seatbelt: 100%      Guns:  Locked and loaded.   Social Determinants of Health   Financial Resource Strain: Not on file  Food Insecurity: Not on file  Transportation Needs: Not on file  Physical Activity: Not on file  Stress: Not on file  Social Connections: Not on file  Intimate Partner Violence: Not on file    Review of Systems  Constitutional:  Negative for fatigue and unexpected weight change.  Eyes:  Negative for visual disturbance.  Respiratory:  Negative for cough, chest tightness and shortness of breath.   Cardiovascular:  Negative for chest  pain, palpitations and leg swelling.  Gastrointestinal:  Negative for abdominal pain and blood in stool.  Neurological:  Negative for dizziness, light-headedness and headaches.     Objective:   Vitals:   03/12/22 0921  BP: 130/78  Pulse: 61  Temp: 98.2 F (36.8 C)  SpO2: 97%  Weight: 166 lb 3.2 oz (75.4 kg)  Height: 5' 6"  (1.676 m)     Physical Exam Vitals reviewed.  Constitutional:      Appearance: He is well-developed.  HENT:     Head: Normocephalic and atraumatic.  Neck:     Vascular: No carotid bruit or JVD.  Cardiovascular:     Rate and Rhythm: Normal rate and regular rhythm.     Heart sounds: Normal heart sounds. No murmur heard. Pulmonary:     Effort: Pulmonary effort is normal.     Breath sounds: Normal breath sounds. No rales.  Musculoskeletal:     Right lower leg: No edema.     Left lower leg: No edema.  Skin:    General: Skin is warm and dry.  Neurological:     Mental Status: He is alert and oriented to person, place, and time.  Psychiatric:        Mood and Affect: Mood normal.     Assessment & Plan:  Phillip Glass is a 66 y.o. male . Essential hypertension - Plan: CANCELED: Comprehensive metabolic panel  -  Stable, tolerating current regimen. Medications refilled. Labs pending as above.   Erectile dysfunction, unspecified erectile dysfunction type - Plan: sildenafil (VIAGRA) 100 MG tablet  -New concern, question psychological component with new relationship, but will try low-dose sildenafil.viagra Rx given - use lowest effective dose. Side effects discussed (including but not limited to headache/flushing, blue discoloration of vision, possible vascular steal and risk of cardiac effects if underlying unknown coronary artery disease, and permanent sensorineural hearing loss). Understanding expressed.  Hyperlipidemia, unspecified hyperlipidemia type - Plan: Lipid panel, CANCELED: Comprehensive metabolic panel  -Recent CMP noted, check lipid panel,  continue same dose  for now.  66-monthfollow-up for physical.  Continue simvastatin.  Prediabetes - Plan: Hemoglobin A1c  -Commended on exercise, repeat A1c.  No new meds for now.  Meds ordered this encounter  Medications   sildenafil (VIAGRA) 100 MG tablet    Sig: Take 0.5-1 tablets (50-100 mg total) by mouth daily as needed for erectile dysfunction.    Dispense:  6 tablet    Refill:  5   amLODipine (NORVASC) 5 MG tablet    Sig: TAKE 1 TABLET(5 MG) BY MOUTH DAILY    Dispense:  90 tablet    Refill:  2   Patient Instructions  See information below, but can try lowest effective dose of sildenafil for now.  No other med changes.  Shingles vaccine, COVID-vaccine should be available at your pharmacy.  Follow-up in 6 months, I will let you know if any concerns on labs.  Take care!  Erectile Dysfunction Erectile dysfunction (ED) is the inability to get or keep an erection in order to have sexual intercourse. ED is considered a symptom of an underlying disorder and is not considered a disease. ED may include: Inability to get an erection. Lack of enough hardness of the erection to allow penetration. Loss of erection before sex is finished. What are the causes? This condition may be caused by: Physical causes, such as: Artery problems. This may include heart disease, high blood pressure, atherosclerosis, and diabetes. Hormonal problems, such as low testosterone. Obesity. Nerve problems. This may include back or pelvic injuries, multiple sclerosis, Parkinson's disease, spinal cord injury, and stroke. Certain medicines, such as: Pain relievers. Antidepressants. Blood pressure medicines and water pills (diuretics). Cancer medicines. Antihistamines. Muscle relaxants. Lifestyle factors, such as: Use of drugs such as marijuana, cocaine, or opioids. Excessive use of alcohol. Smoking. Lack of physical activity or exercise. Psychological causes, such as: Anxiety or stress. Sadness or  depression. Exhaustion. Fear about sexual performance. Guilt. What are the signs or symptoms? Symptoms of this condition include: Inability to get an erection. Lack of  enough hardness of the erection to allow penetration. Loss of the erection before sex is finished. Sometimes having normal erections, but with frequent unsatisfactory episodes. Low sexual satisfaction in either partner due to erection problems. A curved penis occurring with erection. The curve may cause pain, or the penis may be too curved to allow for intercourse. Never having nighttime or morning erections. How is this diagnosed? This condition is often diagnosed by: Performing a physical exam to find other diseases or specific problems with the penis. Asking you detailed questions about the problem. Doing tests, such as: Blood tests to check for diabetes mellitus or high cholesterol, or to measure hormone levels. Other tests to check for underlying health conditions. An ultrasound exam to check for scarring. A test to check blood flow to the penis. Doing a sleep study at home to measure nighttime erections. How is this treated? This condition may be treated by: Medicines, such as: Medicine taken by mouth to help you achieve an erection (oral medicine). Hormone replacement therapy to replace low testosterone levels. Medicine that is injected into the penis. Your health care provider may instruct you how to give yourself these injections at home. Medicine that is delivered with a short applicator tube. The tube is inserted into the opening at the tip of the penis, which is the opening of the urethra. A tiny pellet of medicine is put in the urethra. The pellet dissolves and enhances erectile function. This is also called MUSE (medicated urethral system for erections) therapy. Vacuum pump. This is a pump with a ring on it. The pump and ring are placed on the penis and used to create pressure that helps the penis become  erect. Penile implant surgery. In this procedure, you may receive: An inflatable implant. This consists of cylinders, a pump, and a reservoir. The cylinders can be inflated with a fluid that helps to create an erection, and they can be deflated after intercourse. A semi-rigid implant. This consists of two silicone rubber rods. The rods provide some rigidity. They are also flexible, so the penis can both curve downward in its normal position and become straight for sexual intercourse. Blood vessel surgery to improve blood flow to the penis. During this procedure, a blood vessel from a different part of the body is placed into the penis to allow blood to flow around (bypass) damaged or blocked blood vessels. Lifestyle changes, such as exercising more, losing weight, and quitting smoking. Follow these instructions at home: Medicines  Take over-the-counter and prescription medicines only as told by your health care provider. Do not increase the dosage without first discussing it with your health care provider. If you are using self-injections, do injections as directed by your health care provider. Make sure you avoid any veins that are on the surface of the penis. After giving an injection, apply pressure to the injection site for 5 minutes. Talk to your health care provider about how to prevent headaches while taking ED medicines. These medicines may cause a sudden headache due to the increase in blood flow in your body. General instructions Exercise regularly, as directed by your health care provider. Work with your health care provider to lose weight, if needed. Do not use any products that contain nicotine or tobacco. These products include cigarettes, chewing tobacco, and vaping devices, such as e-cigarettes. If you need help quitting, ask your health care provider. Before using a vacuum pump, read the instructions that come with the pump and discuss any questions with your  health care  provider. Keep all follow-up visits. This is important. Contact a health care provider if: You feel nauseous. You are vomiting. You get sudden headaches while taking ED medicines. You have any concerns about your sexual health. Get help right away if: You are taking oral or injectable medicines and you have an erection that lasts longer than 4 hours. If your health care provider is unavailable, go to the nearest emergency room for evaluation. An erection that lasts much longer than 4 hours can result in permanent damage to your penis. You have severe pain in your groin or abdomen. You develop redness or severe swelling of your penis. You have redness spreading at your groin or lower abdomen. You are unable to urinate. You experience chest pain or a rapid heartbeat (palpitations) after taking oral medicines. These symptoms may represent a serious problem that is an emergency. Do not wait to see if the symptoms will go away. Get medical help right away. Call your local emergency services (911 in the [place] Do not drive yourself to the hospital. Summary Erectile dysfunction (ED) is the inability to get or keep an erection during sexual intercourse. This condition is diagnosed based on a physical exam, your symptoms, and tests to determine the cause. Treatment varies depending on the cause and may include medicines, hormone therapy, surgery, or a vacuum pump. You may need follow-up visits to make sure that you are using your medicines or devices correctly. Get help right away if you are taking or injecting medicines and you have an erection that lasts longer than 4 hours. This information is not intended to replace advice given to you by your health care provider. Make sure you discuss any questions you have with your health care provider. Document Revised: 06/12/2020 Document Reviewed: 06/12/2020 Elsevier Patient Education  [place],   [name], MD [place] 03/12/22 9:58 AM

## 2022-03-18 DIAGNOSIS — M0609 Rheumatoid arthritis without rheumatoid factor, multiple sites: Secondary | ICD-10-CM | POA: Diagnosis not present

## 2022-03-18 DIAGNOSIS — M79672 Pain in left foot: Secondary | ICD-10-CM | POA: Diagnosis not present

## 2022-03-18 DIAGNOSIS — Z6826 Body mass index (BMI) 26.0-26.9, adult: Secondary | ICD-10-CM | POA: Diagnosis not present

## 2022-03-18 DIAGNOSIS — K50818 Crohn's disease of both small and large intestine with other complication: Secondary | ICD-10-CM | POA: Diagnosis not present

## 2022-03-24 DIAGNOSIS — H524 Presbyopia: Secondary | ICD-10-CM | POA: Diagnosis not present

## 2022-06-15 DIAGNOSIS — M0609 Rheumatoid arthritis without rheumatoid factor, multiple sites: Secondary | ICD-10-CM | POA: Diagnosis not present

## 2022-06-23 ENCOUNTER — Other Ambulatory Visit: Payer: Self-pay | Admitting: Family Medicine

## 2022-06-23 DIAGNOSIS — I10 Essential (primary) hypertension: Secondary | ICD-10-CM

## 2022-07-31 DIAGNOSIS — K5010 Crohn's disease of large intestine without complications: Secondary | ICD-10-CM | POA: Diagnosis not present

## 2022-09-17 ENCOUNTER — Encounter: Payer: Self-pay | Admitting: Family Medicine

## 2022-09-17 ENCOUNTER — Ambulatory Visit (INDEPENDENT_AMBULATORY_CARE_PROVIDER_SITE_OTHER): Payer: Medicare Other | Admitting: Family Medicine

## 2022-09-17 VITALS — BP 132/78 | HR 65 | Temp 98.3°F | Ht 66.0 in | Wt 167.8 lb

## 2022-09-17 DIAGNOSIS — R7303 Prediabetes: Secondary | ICD-10-CM

## 2022-09-17 DIAGNOSIS — E785 Hyperlipidemia, unspecified: Secondary | ICD-10-CM | POA: Diagnosis not present

## 2022-09-17 DIAGNOSIS — I10 Essential (primary) hypertension: Secondary | ICD-10-CM

## 2022-09-17 DIAGNOSIS — L700 Acne vulgaris: Secondary | ICD-10-CM

## 2022-09-17 DIAGNOSIS — N529 Male erectile dysfunction, unspecified: Secondary | ICD-10-CM | POA: Diagnosis not present

## 2022-09-17 LAB — COMPREHENSIVE METABOLIC PANEL
ALT: 27 U/L (ref 0–53)
AST: 23 U/L (ref 0–37)
Albumin: 4.5 g/dL (ref 3.5–5.2)
Alkaline Phosphatase: 61 U/L (ref 39–117)
BUN: 15 mg/dL (ref 6–23)
CO2: 27 mEq/L (ref 19–32)
Calcium: 9.3 mg/dL (ref 8.4–10.5)
Chloride: 103 mEq/L (ref 96–112)
Creatinine, Ser: 1.1 mg/dL (ref 0.40–1.50)
GFR: 69.97 mL/min (ref >60.00)
Glucose, Bld: 90 mg/dL (ref 70–99)
Potassium: 4.1 mEq/L (ref 3.5–5.1)
Sodium: 140 mEq/L (ref 135–145)
Total Bilirubin: 0.6 mg/dL (ref 0.2–1.2)
Total Protein: 7.2 g/dL (ref 6.0–8.3)

## 2022-09-17 LAB — LIPID PANEL
Cholesterol: 159 mg/dL (ref 0–200)
HDL: 57.9 mg/dL (ref >39.00)
LDL Cholesterol: 86 mg/dL (ref 0–99)
NonHDL: 100.66
Total CHOL/HDL Ratio: 3
Triglycerides: 73 mg/dL (ref 0.0–149.0)
VLDL: 14.6 mg/dL (ref 0.0–40.0)

## 2022-09-17 LAB — HEMOGLOBIN A1C: Hgb A1c MFr Bld: 5.5 % (ref 4.6–6.5)

## 2022-09-17 MED ORDER — SIMVASTATIN 20 MG PO TABS: ORAL_TABLET | ORAL | 2 refills | Status: DC

## 2022-09-17 MED ORDER — AMLODIPINE BESYLATE 5 MG PO TABS: ORAL_TABLET | ORAL | 2 refills | Status: DC

## 2022-09-17 MED ORDER — SILDENAFIL CITRATE 100 MG PO TABS: 50.0000 mg | ORAL_TABLET | Freq: Every day | ORAL | 5 refills | Status: DC | PRN

## 2022-09-17 NOTE — Patient Instructions (Signed)
Spot on back appears to be a blocked oil gland but does not appear infected or concerning.  No treatment needed for that at this time.  If any swelling, redness, pain or new discharge be seen.  I do not expect that to occur.  No med changes at this time.  I will let you know if there are any concerns on labs.  Take care.

## 2022-09-17 NOTE — Progress Notes (Signed)
Subjective:  Patient ID: Phillip Glass, male    DOB: [birth year]  Age: 67 y.o. MRN: [id number]  CC:  Chief Complaint  Patient presents with   Medical Management of Chronic Issues   Skin Problem    Notes was told he has a spot on his back beginning of the month he should have checked notes it looks like a pimple, notes no pain     HPI Phillip Glass presents for   Lesion on back: Noticed few weeks ago by physical therapist. No pain. No redness. No changes known.   Hypertension: Amlodipine 5 mg daily.no new side effects Home readings:111/69-132/70.  Exercising 5 days per week.  BP Readings from Last 3 Encounters:  09/17/22 132/78  03/12/22 130/78  09/04/21 130/76   Lab Results  Component Value Date   CREATININE 1.09 09/04/2021   Hyperlipidemia: Simvastatin 20 mg daily without new myalgias or side effects. Lab Results  Component Value Date   CHOL 167 03/12/2022   HDL 65.50 03/12/2022   LDLCALC 90 03/12/2022   TRIG 55.0 03/12/2022   CHOLHDL 3 03/12/2022   Lab Results  Component Value Date   ALT 18 09/04/2021   AST 17 09/04/2021   ALKPHOS 57 09/04/2021   BILITOT 0.7 09/04/2021   Prediabetes: Borderline, stable A1c in December from 5.7 in June last year Still avoiding fast food, sugar beverages. Exercising as above Work going well. Will be able to retire after a year, not sure he will.  Lab Results  Component Value Date   HGBA1C 5.8 03/12/2022   Wt Readings from Last 3 Encounters:  09/17/22 167 lb 12.8 oz (76.1 kg)  03/12/22 166 lb 3.2 oz (75.4 kg)  09/04/21 166 lb 3.2 oz (75.4 kg)   Erectile dysfunction Discussed in December, new relationship at that time.  Ordered Viagra 100 mg, lowest effective dose. 1/2 pill infrequently - working well.  Denies headache, flushing, vision or hearing changes or chest pain/dyspnea with exercise.   HM: plans on shingrix at pharmacy, declines covid booster.   History Patient Active Problem List   Diagnosis Date Noted    Polyarthralgia 01/18/2014   Crohn's disease of colon without complication (HCC) 04/08/2012   Multiple nevi 03/03/2012   Past Medical History:  Diagnosis Date   Crohn's colitis [facility])    Past Surgical History:  Procedure Laterality Date   COLONOSCOPY  01/12/2011   TONSILLECTOMY     Allergies  Allergen Reactions   Codeine Other (See Comments)    Pt states he hallucinates   Corylus Hives   Prior to Admission medications   Medication Sig Start Date End Date Taking? Authorizing Provider  Adalimumab (HUMIRA PEN) 40 MG/0.4ML PNKT INJECT 40MG  SUBCUTANEOUSLY  EVERY 2 WEEKS 02/28/21  Yes [provider]  Adalimumab 40 MG/0.8ML PNKT Inject 40 mg into the skin. 05/22/15  Yes [provider]  Aloe Vera 500 MG CAPS Take 1 capsule by mouth daily.   Yes [provider]  amLODipine (NORVASC) 5 MG tablet TAKE 1 TABLET(5 MG) BY MOUTH DAILY 06/23/22  Yes Shade Flood, MD  Bioflavonoid Products (BIOFLEX) TABS Take 1 tablet by mouth daily.   Yes [provider]  folic acid (FOLVITE) 1 MG tablet Take 1 mg by mouth daily.   Yes [provider]  GARLIC PO Take by mouth.   Yes [provider]  methotrexate (RHEUMATREX) 2.5 MG tablet Take by mouth.   Yes [provider]  Multiple Vitamin (MULTI-VITAMIN) tablet Take 1  tablet by mouth daily.   Yes [provider]  sildenafil (VIAGRA) 100 MG tablet Take 0.5-1 tablets (50-100 mg total) by mouth daily as needed for erectile dysfunction. 03/12/22  Yes Shade Flood, MD  simvastatin (ZOCOR) 20 MG tablet TAKE 1 TABLET(20 MG) BY MOUTH AT BEDTIME 02/11/22  Yes Shade Flood, MD   Social History   Socioeconomic History   Marital status: Divorced    Spouse name: Not on file   Number of children: Not on file   Years of education: Not on file   Highest education level: Not on file  Occupational History   Not on file  Tobacco Use   Smoking status: Never   Smokeless tobacco: Never   Vaping Use   Vaping Use: Never used  Substance and Sexual Activity   Alcohol use: Not Currently    Alcohol/week: 1.0 standard drink of alcohol    Types: 1 Cans of beer per week   Drug use: No   Sexual activity: Not Currently    Birth control/protection: Abstinence  Other Topics Concern   Not on file  Social History Narrative   Marital status: divorced in 2013.  Not dating.  Married x 20 years.      Children:  1 child (22); no grandchild.      Lives: with son.      Employment: Naval architect x 35 years; happy.      Tobacco; none      Alcohol: none      Drugs; none      Exercise:  6 days per week for 45 minutes.  Cardio twice weekly; weightlifting 5 days per week.      Seatbelt: 100%      Guns:  Locked and loaded.   Social Determinants of Health   Financial Resource Strain: Not on file  Food Insecurity: Not on file  Transportation Needs: Not on file  Physical Activity: Not on file  Stress: Not on file  Social Connections: Not on file  Intimate Partner Violence: Not on file    Review of Systems Per hPI  Objective:   Vitals:   09/17/22 0918  BP: 132/78  Pulse: 65  Temp: 98.3 F (36.8 C)  TempSrc: Temporal  SpO2: 98%  Weight: 167 lb 12.8 oz (76.1 kg)  Height: 5\' 6"  (1.676 m)     Physical Exam Vitals reviewed.  Constitutional:      Appearance: He is well-developed.  HENT:     Head: Normocephalic and atraumatic.  Neck:     Vascular: No carotid bruit or JVD.  Cardiovascular:     Rate and Rhythm: Normal rate and regular rhythm.     Heart sounds: Normal heart sounds. No murmur heard. Pulmonary:     Effort: Pulmonary effort is normal.     Breath sounds: Normal breath sounds. No rales.  Musculoskeletal:     Right lower leg: No edema.     Left lower leg: No edema.  Skin:    General: Skin is warm and dry.     Comments: Mid back, darkened punctate area, appears to be comedonal without surrounding erythema, induration, tenderness or discharge.  See photo   Neurological:     Mental Status: He is alert and oriented to person, place, and time.  Psychiatric:        Mood and Affect: Mood normal.        Assessment & Plan:  Phillip Glass is a 67 y.o. male . Prediabetes - Plan:  Hemoglobin A1c  -Commended on exercise, continue same along with healthy eating.  Check A1c and adjust plan accordingly.  Erectile dysfunction, unspecified erectile dysfunction type - Plan: sildenafil (VIAGRA) 100 MG tablet  -Stable with Viagra, continue lowest effective dose.viagra Rx given - use lowest effective dose. Side effects discussed (including but not limited to headache/flushing, blue discoloration of vision, possible vascular steal and risk of cardiac effects if underlying unknown coronary artery disease, and permanent sensorineural hearing loss). Understanding expressed.  Essential hypertension - Plan: amLODipine (NORVASC) 5 MG tablet  -Borderline in office, stable outside of office, goal of under 130/80 discussed, continue same dose amlodipine, labs pending.  Hyperlipidemia, unspecified hyperlipidemia type - Plan: simvastatin (ZOCOR) 20 MG tablet, Comprehensive metabolic panel, Lipid panel  -Tolerating current regimen, continue simvastatin same dose, check labs with adjustment of plan accordingly  Comedone  Comedone of back, possible small sebaceous cyst, but not tender or swelling.  RTC precautions.  No treatment needed at this time. Meds ordered this encounter  Medications   sildenafil (VIAGRA) 100 MG tablet    Sig: Take 0.5-1 tablets (50-100 mg total) by mouth daily as needed for erectile dysfunction.    Dispense:  6 tablet    Refill:  5   amLODipine (NORVASC) 5 MG tablet    Sig: TAKE 1 TABLET(5 MG) BY MOUTH DAILY    Dispense:  90 tablet    Refill:  2   simvastatin (ZOCOR) 20 MG tablet    Sig: TAKE 1 TABLET(20 MG) BY MOUTH AT BEDTIME    Dispense:  90 tablet    Refill:  2   Patient Instructions  Spot on back appears to be a blocked oil gland  but does not appear infected or concerning.  No treatment needed for that at this time.  If any swelling, redness, pain or new discharge be seen.  I do not expect that to occur.  No med changes at this time.  I will let you know if there are any concerns on labs.  Take care.       Signed,   Meredith Staggers, MD [facility], [facility] 09/17/22 9:59 AM

## 2022-09-24 DIAGNOSIS — M0609 Rheumatoid arthritis without rheumatoid factor, multiple sites: Secondary | ICD-10-CM | POA: Diagnosis not present

## 2022-09-24 DIAGNOSIS — M79672 Pain in left foot: Secondary | ICD-10-CM | POA: Diagnosis not present

## 2022-09-24 DIAGNOSIS — K50818 Crohn's disease of both small and large intestine with other complication: Secondary | ICD-10-CM | POA: Diagnosis not present

## 2022-11-02 ENCOUNTER — Ambulatory Visit (INDEPENDENT_AMBULATORY_CARE_PROVIDER_SITE_OTHER): Payer: Medicare Other | Admitting: Family Medicine

## 2022-11-02 VITALS — BP 134/70 | Temp 98.4°F | Ht 66.0 in | Wt 165.8 lb

## 2022-11-02 DIAGNOSIS — M25562 Pain in left knee: Secondary | ICD-10-CM

## 2022-11-02 NOTE — Progress Notes (Signed)
Subjective:  Patient ID: Phillip Glass, male    DOB: 11-20-1955  Age: 67 y.o. MRN: [id number]  CC:  Chief Complaint  Patient presents with   Knee Pain    Pt notes 10 days has been working 12 hour shifts, Lt knee, notes also did some lawn care, did an exercise class and had his leg give out on him at that time, tried doing ibuprofen and stretches, notes he has been trying to brace as well     HPI Phillip Glass presents for   L knee pain: Working 12 hour days for about a week prior to injury. Mowed yard last week. Went into 4 days ago. Leg kick with martial arts class, kick ok, came down onto knee, knee gave way, fell. Pain on outside of knee and some on inside of knee. Heard pop.  Pain with WB initial day, better since but still sore with heel-toe or with twisting on outside primarily. Prior meniscus tear on R knee, treated with injection, HEP. No prior left knee issues.  As we discussed  Tx: ice, heat, brace. Topical pain reliever, ibuprofen few times per day- feels better. Sore to cut/twist.      Usually wears braces with work and working out.   History Patient Active Problem List   Diagnosis Date Noted   Polyarthralgia 01/18/2014   Crohn's disease of colon without complication (HCC) 04/08/2012   Multiple nevi 03/03/2012   Past Medical History:  Diagnosis Date   Crohn's colitis [facility])    Past Surgical History:  Procedure Laterality Date   COLONOSCOPY  01/12/2011   TONSILLECTOMY     Allergies  Allergen Reactions   Codeine Other (See Comments)    Pt states he hallucinates   Corylus Hives   Prior to Admission medications   Medication Sig Start Date End Date Taking? Authorizing Provider  Adalimumab (HUMIRA PEN) 40 MG/0.4ML PNKT INJECT 40MG  SUBCUTANEOUSLY  EVERY 2 WEEKS 02/28/21  Yes [provider]  Adalimumab 40 MG/0.8ML PNKT Inject 40 mg into the skin. 05/22/15  Yes [provider]  Aloe Vera 500 MG CAPS Take 1 capsule by mouth daily.   Yes  [provider]  amLODipine (NORVASC) 5 MG tablet TAKE 1 TABLET(5 MG) BY MOUTH DAILY 09/17/22  Yes Shade Flood, MD  Bioflavonoid Products (BIOFLEX) TABS Take 1 tablet by mouth daily.   Yes [provider]  folic acid (FOLVITE) 1 MG tablet Take 1 mg by mouth daily.   Yes [provider]  GARLIC PO Take by mouth.   Yes [provider]  methotrexate (RHEUMATREX) 2.5 MG tablet Take by mouth.   Yes [provider]  Multiple Vitamin (MULTI-VITAMIN) tablet Take 1 tablet by mouth daily.   Yes [provider]  sildenafil (VIAGRA) 100 MG tablet Take 0.5-1 tablets (50-100 mg total) by mouth daily as needed for erectile dysfunction. 09/17/22  Yes Shade Flood, MD  simvastatin (ZOCOR) 20 MG tablet TAKE 1 TABLET(20 MG) BY MOUTH AT BEDTIME 09/17/22  Yes Shade Flood, MD   Social History   Socioeconomic History   Marital status: Divorced    Spouse name: Not on file   Number of children: Not on file   Years of education: Not on file   Highest education level: Not on file  Occupational History   Not on file  Tobacco Use   Smoking status: Never   Smokeless tobacco: Never  Vaping Use   Vaping status: Never Used  Substance and Sexual Activity   Alcohol use: Not Currently    Alcohol/week: 1.0 standard drink of alcohol    Types: 1 Cans of beer per week   Drug use: No   Sexual activity: Not Currently    Birth control/protection: Abstinence  Other Topics Concern   Not on file  Social History Narrative   Marital status: divorced in 2013.  Not dating.  Married x 20 years.      Children:  1 child (22); no grandchild.      Lives: with son.      Employment: Naval architect x 35 years; happy.      Tobacco; none      Alcohol: none      Drugs; none      Exercise:  6 days per week for 45 minutes.  Cardio twice weekly; weightlifting 5 days per week.      Seatbelt: 100%      Guns:  Locked and loaded.   Social Determinants of Health    Financial Resource Strain: Not on file  Food Insecurity: Not on file  Transportation Needs: Not on file  Physical Activity: Not on file  Stress: Not on file  Social Connections: Not on file  Intimate Partner Violence: Not on file    Review of Systems Per HPI.   Objective:   Vitals:   11/02/22 1547  BP: 134/70  Temp: 98.4 F (36.9 C)  TempSrc: Temporal  Weight: 165 lb 12.8 oz (75.2 kg)  Height: 5\' 6"  (1.676 m)     Physical Exam Vitals reviewed.  Constitutional:      General: He is not in acute distress.    Appearance: Normal appearance. He is well-developed.  HENT:     Head: Normocephalic and atraumatic.  Cardiovascular:     Rate and Rhythm: Normal rate.  Pulmonary:     Effort: Pulmonary effort is normal.  Musculoskeletal:     Comments: Right knee.  Full range of motion, no effusion  Left knee, skin intact, no erythema, no ecchymosis, possible trace effusion.  Intact active range of motion.  No focal bony tenderness.  Able to straight leg raise without difficulty or lag.  Slight discomfort over the lateral joint line, anterior aspect.  No click with McMurray but slight discomfort in same area.  Negative Lachman, negative varus and valgus stress testing, no appreciable widening.  Neurovascular intact distally.  Neurological:     Mental Status: He is alert and oriented to person, place, and time.  Psychiatric:        Mood and Affect: Mood normal.     Assessment & Plan:  Phillip Glass is a 67 y.o. male . Acute pain of left knee - Plan: DG Knee Complete 4 Views Left Left lateral knee pain after landing from kick as above, 4 days ago.  Lateral joint line tender, mechanism, symptoms and exam suspicious for possible meniscal tear.  Monitor symptoms at this time and improved with home treatment.  Continue symptomatic care, has brace at home.  Avoid cutting, twisting, offending activities for now but okay for continued range of motion, gentle stretches and light weights  without twisting, turning.  X-ray if not continue to improve in the next few days, update on symptoms within the next 1 week and then can decide if injection or orthopedic evaluation needed.  RTC precautions if worse sooner.  No orders of the defined types were placed in this encounter.  Patient Instructions  She could have a small  tear of the meniscus on the outside part of your left knee.  I do not appreciate much swelling today, overall exam is reassuring.  Okay to use over-the-counter brace, topical treatment such as Biofreeze if needed, heat or ice if needed, continue gentle range of motion and gentle stretches okay for now.  Avoid cutting, twisting activities is much as possible for now and give me an update in the next 1 week.  If pain is not improved in the next few days, please have x-ray at the [facility] location below.  If any worsening symptoms let me know.  Take care.   [facility] or xray: Walk in 8:30-4:30 during weekdays, no appointment needed [address]  [place], [place] [address]   Acute Knee Pain, Adult Many things can cause knee pain. Sometimes, knee pain is sudden (acute). It may be caused by damage, swelling, or irritation of the muscles and tissues that support your knee. Pain may come from: A fall. An injury to the knee from twisting motions. A hit to the knee. Infection. The pain often goes away on its own with time and rest. If the pain does not go away, tests may be done to find out what is causing the pain. These may include: Imaging tests, such as an X-ray, MRI, CT scan, or ultrasound. Joint aspiration. In this test, fluid is removed from the knee and checked. Arthroscopy. In this test, a lighted tube is put in the knee and an image is shown on a screen. A biopsy. In this test, a health care provider will remove a small piece of tissue for testing. Follow these instructions at home: If you have a knee sleeve or brace that can be taken off:  Wear the  knee sleeve or brace as told by your provider. Take it off only if your provider says that you can. Check the skin around it every day. Tell your provider if you see problems. Loosen the knee sleeve or brace if your toes tingle, are numb, or turn cold and blue. Keep the knee sleeve or brace clean and dry. Bathing If the knee sleeve or brace is not waterproof: Do not let it get wet. Cover it when you take a bath or shower. Use a cover that does not let any water in. Managing pain, stiffness, and swelling  If told, put ice on the area. If you have a knee sleeve or brace that you can take off, remove it as told. Put ice in a plastic bag. Place a towel between your skin and the bag. Leave the ice on for 20 minutes, 2-3 times a day. If your skin turns bright red, take off the ice right away to prevent skin damage. The risk of damage is higher if you cannot feel pain, heat, or cold. Move your toes often to reduce stiffness and swelling. Raise the injured area above the level of your heart while you are sitting or lying down. Use a pillow to support your foot as needed. If told, use an elastic bandage to put pressure (compression) on your injured knee. This may control swelling, give support, and help with discomfort. Sleep with a pillow under your knee. Activity Rest your knee. Do not do things that cause pain or make pain worse. Do not stand or walk on your injured knee until you're told it's okay. Use crutches as told. Avoid activities where both feet leave the ground at the same time and put stress on the joints.  Avoid running, jumping rope, and doing jumping jacks. Work with a physical therapist to make a safe exercise program if told. Physical therapy helps your knee move better and get stronger. Exercise as told. General instructions Take your medicines only as told by your provider. If you are overweight, work with your provider and an expert in healthy eating, called a dietician, to set  goals to lose weight. Being overweight can make your knee hurt more. Do not smoke, vape, or use products with nicotine or tobacco in them. If you need help quitting, talk with your provider. Return to normal activities when you are told. Ask what things are safe for you to do. Watch for any changes in your symptoms. Keep all follow-up visits. Your provider will check your healing and adjust treatments if needed. Contact a health care provider if: The knee pain does not stop. The knee pain changes or gets worse. You have a fever along with knee pain. Your knee is red or feels warm when you touch it. Your knee gives out or locks up. Get help right away if: Your knee swells and the swelling gets worse. You cannot move your knee. You have very bad knee pain that does not get better with medicine. This information is not intended to replace advice given to you by your health care provider. Make sure you discuss any questions you have with your health care provider. Document Revised: 05/11/2022 Document Reviewed: 05/11/2022 Elsevier Patient Education  2024 [employer] Inc.      Signed,   Meredith Staggers, MD [facility], [facility] 11/02/22 4:51 PM

## 2022-11-02 NOTE — Patient Instructions (Addendum)
She could have a small tear of the meniscus on the outside part of your left knee.  I do not appreciate much swelling today, overall exam is reassuring.  Okay to use over-the-counter brace, topical treatment such as Biofreeze if needed, heat or ice if needed, continue gentle range of motion and gentle stretches okay for now.  Avoid cutting, twisting activities is much as possible for now and give me an update in the next 1 week.  If pain is not improved in the next few days, please have x-ray at the [facility] location below.  If any worsening symptoms let me know.  Take care.   [facility] or xray: Walk in 8:30-4:30 during weekdays, no appointment needed [address]  [place], [place] [address]   Acute Knee Pain, Adult Many things can cause knee pain. Sometimes, knee pain is sudden (acute). It may be caused by damage, swelling, or irritation of the muscles and tissues that support your knee. Pain may come from: A fall. An injury to the knee from twisting motions. A hit to the knee. Infection. The pain often goes away on its own with time and rest. If the pain does not go away, tests may be done to find out what is causing the pain. These may include: Imaging tests, such as an X-ray, MRI, CT scan, or ultrasound. Joint aspiration. In this test, fluid is removed from the knee and checked. Arthroscopy. In this test, a lighted tube is put in the knee and an image is shown on a screen. A biopsy. In this test, a health care provider will remove a small piece of tissue for testing. Follow these instructions at home: If you have a knee sleeve or brace that can be taken off:  Wear the knee sleeve or brace as told by your provider. Take it off only if your provider says that you can. Check the skin around it every day. Tell your provider if you see problems. Loosen the knee sleeve or brace if your toes tingle, are numb, or turn cold and blue. Keep the knee sleeve or brace clean and  dry. Bathing If the knee sleeve or brace is not waterproof: Do not let it get wet. Cover it when you take a bath or shower. Use a cover that does not let any water in. Managing pain, stiffness, and swelling  If told, put ice on the area. If you have a knee sleeve or brace that you can take off, remove it as told. Put ice in a plastic bag. Place a towel between your skin and the bag. Leave the ice on for 20 minutes, 2-3 times a day. If your skin turns bright red, take off the ice right away to prevent skin damage. The risk of damage is higher if you cannot feel pain, heat, or cold. Move your toes often to reduce stiffness and swelling. Raise the injured area above the level of your heart while you are sitting or lying down. Use a pillow to support your foot as needed. If told, use an elastic bandage to put pressure (compression) on your injured knee. This may control swelling, give support, and help with discomfort. Sleep with a pillow under your knee. Activity Rest your knee. Do not do things that cause pain or make pain worse. Do not stand or walk on your injured knee until you're told it's okay. Use crutches as told. Avoid activities where both feet leave the ground at the same time and  put stress on the joints. Avoid running, jumping rope, and doing jumping jacks. Work with a physical therapist to make a safe exercise program if told. Physical therapy helps your knee move better and get stronger. Exercise as told. General instructions Take your medicines only as told by your provider. If you are overweight, work with your provider and an expert in healthy eating, called a dietician, to set goals to lose weight. Being overweight can make your knee hurt more. Do not smoke, vape, or use products with nicotine or tobacco in them. If you need help quitting, talk with your provider. Return to normal activities when you are told. Ask what things are safe for you to do. Watch for any changes  in your symptoms. Keep all follow-up visits. Your provider will check your healing and adjust treatments if needed. Contact a health care provider if: The knee pain does not stop. The knee pain changes or gets worse. You have a fever along with knee pain. Your knee is red or feels warm when you touch it. Your knee gives out or locks up. Get help right away if: Your knee swells and the swelling gets worse. You cannot move your knee. You have very bad knee pain that does not get better with medicine. This information is not intended to replace advice given to you by your health care provider. Make sure you discuss any questions you have with your health care provider. Document Revised: 05/11/2022 Document Reviewed: 05/11/2022 Elsevier Patient Education  2024 [employer].

## 2022-12-02 ENCOUNTER — Ambulatory Visit (INDEPENDENT_AMBULATORY_CARE_PROVIDER_SITE_OTHER): Payer: Medicare Other

## 2022-12-02 ENCOUNTER — Ambulatory Visit: Payer: Medicare Other

## 2022-12-02 DIAGNOSIS — Z23 Encounter for immunization: Secondary | ICD-10-CM

## 2022-12-02 NOTE — Progress Notes (Signed)
Pt came in for his high dose flu vaccine gave injection In right deltoid and pt tolerated well

## 2022-12-24 DIAGNOSIS — M0609 Rheumatoid arthritis without rheumatoid factor, multiple sites: Secondary | ICD-10-CM | POA: Diagnosis not present

## 2023-01-11 ENCOUNTER — Encounter: Payer: Self-pay | Admitting: Emergency Medicine

## 2023-01-11 ENCOUNTER — Ambulatory Visit
Admission: EM | Admit: 2023-01-11 | Discharge: 2023-01-11 | Disposition: A | Discharge status: Home / Self Care | Payer: Medicare Other | Attending: Emergency Medicine | Admitting: Emergency Medicine

## 2023-01-11 ENCOUNTER — Ambulatory Visit: Payer: Medicare Other | Admitting: Family Medicine

## 2023-01-11 DIAGNOSIS — M25562 Pain in left knee: Secondary | ICD-10-CM | POA: Diagnosis not present

## 2023-01-11 MED ORDER — MELOXICAM 7.5 MG PO TABS: 7.5000 mg | ORAL_TABLET | Freq: Every day | ORAL | 0 refills | Status: DC

## 2023-01-11 NOTE — ED Triage Notes (Signed)
Pt hurt left knee about month ago. Pt takes martial arts and hurt doing a move. Pt seen at [facility] in [place], went to day to follow up with them bc pain not any better, but was too late to be seen there. Pt wears a brace when doing strenuous activities. Taking Ibuprofen for pain, that will dull for little bit but will come back. Reports ice knee at times and will use heat at times.  Told it is his meniscus.

## 2023-01-11 NOTE — Discharge Instructions (Addendum)
Continue to ice, elevate, and use brace as needed Avoid high impact activity Please follow up with orthopeidcs  Take the Mobic once daily. Do not use any other NSAIDs while using this

## 2023-01-11 NOTE — ED Provider Notes (Signed)
[facility]    CSN: [id number] Arrival date & time: 01/11/23  1649      History   Chief Complaint Chief Complaint  Patient presents with   Knee Pain    HPI Phillip Glass is a 67 y.o. male.  2 month history of LEFT knee pain Started after a martial arts move, and he works long hours on his feet No trauma or falls He went to PCP on 8/5, RICE therapy advised He has continued to be active and takes exercise classes Ibuprofen is helpful temporarily   Torn meniscus on R knee, had received cortisone shot that was helpful   Past Medical History:  Diagnosis Date   Crohn's colitis [facility])     Patient Active Problem List   Diagnosis Date Noted   Polyarthralgia 01/18/2014   Crohn's disease of colon without complication (HCC) 04/08/2012   Multiple nevi 03/03/2012    Past Surgical History:  Procedure Laterality Date   COLONOSCOPY  01/12/2011   TONSILLECTOMY         Home Medications    Prior to Admission medications   Medication Sig Start Date End Date Taking? Authorizing Provider  meloxicam (MOBIC) 7.5 MG tablet Take 1 tablet (7.5 mg total) by mouth daily. 01/11/23  Yes Bryla Burek, Lurena Joiner, PA-C  Adalimumab (HUMIRA PEN) 40 MG/0.4ML PNKT INJECT 40MG  SUBCUTANEOUSLY  EVERY 2 WEEKS 02/28/21   [provider]  Adalimumab 40 MG/0.8ML PNKT Inject 40 mg into the skin. 05/22/15   [provider]  Aloe Vera 500 MG CAPS Take 1 capsule by mouth daily.    [provider]  amLODipine (NORVASC) 5 MG tablet TAKE 1 TABLET(5 MG) BY MOUTH DAILY 09/17/22   Shade Flood, MD  Bioflavonoid Products (BIOFLEX) TABS Take 1 tablet by mouth daily.    [provider]  folic acid (FOLVITE) 1 MG tablet Take 1 mg by mouth daily.    [provider]  GARLIC PO Take by mouth.    [provider]  methotrexate (RHEUMATREX) 2.5 MG tablet Take by mouth.    [provider]  Multiple Vitamin (MULTI-VITAMIN) tablet Take 1 tablet by mouth  daily.    [provider]  sildenafil (VIAGRA) 100 MG tablet Take 0.5-1 tablets (50-100 mg total) by mouth daily as needed for erectile dysfunction. 09/17/22   Shade Flood, MD  simvastatin (ZOCOR) 20 MG tablet TAKE 1 TABLET(20 MG) BY MOUTH AT BEDTIME 09/17/22   Shade Flood, MD    Family History Family History  Problem Relation Age of Onset   Hypertension Father    Heart disease Father 23       AMI age 33; second  AMI age 63 cause of death   Diabetes Sister    Dementia Mother    Hypertension Mother     Social History Social History   Tobacco Use   Smoking status: Never   Smokeless tobacco: Never  Vaping Use   Vaping status: Never Used  Substance Use Topics   Alcohol use: Not Currently    Alcohol/week: 1.0 standard drink of alcohol    Types: 1 Cans of beer per week   Drug use: No     Allergies   Codeine and Corylus   Review of Systems Review of Systems Per HPI  Physical Exam Triage Vital Signs ED Triage Vitals [01/11/23 1708]  Encounter Vitals Group     BP (!) 158/83     Systolic BP Percentile  Diastolic BP Percentile      Pulse Rate 70     Resp 16     Temp 98.4 F (36.9 C)     Temp Source Oral     SpO2 97 %     Weight      Height      Head Circumference      Peak Flow      Pain Score      Pain Loc      Pain Education      Exclude from Growth Chart    No data found.  Updated Vital Signs BP (!) 158/83 (BP Location: Right Arm)   Pulse 70   Temp 98.4 F (36.9 C) (Oral)   Resp 16   SpO2 97%    Physical Exam Vitals and nursing note reviewed.  Constitutional:      General: He is not in acute distress. HENT:     Mouth/Throat:     Pharynx: Oropharynx is clear.  Cardiovascular:     Rate and Rhythm: Normal rate and regular rhythm.     Pulses: Normal pulses.  Pulmonary:     Effort: Pulmonary effort is normal.  Musculoskeletal:     Right knee: Normal. No effusion or bony tenderness. Normal range of motion.     Right foot:  Normal capillary refill. Normal pulse.     Comments: Normal ROM. No bony tenderness. No obvious swelling or deformity. Distal sensation intact. Strong DP pulse. Cap refill < 2 seconds  Skin:    General: Skin is warm and dry.     Capillary Refill: Capillary refill takes less than 2 seconds.  Neurological:     Mental Status: He is alert and oriented to person, place, and time.     Sensory: No sensory deficit.     Gait: Gait normal.     Comments: Strength 5/5, sensation intact      UC Treatments / Results  Labs (all labs ordered are listed, but only abnormal results are displayed) Labs Reviewed - No data to display  EKG  Radiology No results found.  Procedures Procedures (including critical care time)  Medications Ordered in UC Medications - No data to display  Initial Impression / Assessment and Plan / UC Course  I have reviewed the triage vital signs and the nursing notes.  Pertinent labs & imaging results that were available during my care of the patient were reviewed by me and considered in my medical decision making (see chart for details).  Suspect soft tissue injury. He has continued martial arts and other classes. Consider overuse injury contributing. No bony tenderness or direct trauma; no indication for xray imaging. He would benefit more from ultrasound and/or MRI. Start trial of Mobic daily and d/c other nsaids Discussed ultimately needs to follow with orthopedics. Provided with two clinics. Advised taking it easy and only doing gentle, low impact exercising for now Patient agreeable to plan   Final Clinical Impressions(s) / UC Diagnoses   Final diagnoses:  Acute pain of left knee     Discharge Instructions      Continue to ice, elevate, and use brace as needed Avoid high impact activity Please follow up with orthopeidcs  Take the Mobic once daily. Do not use any other NSAIDs while using this    ED Prescriptions     Medication Sig Dispense Auth.  Provider   meloxicam (MOBIC) 7.5 MG tablet Take 1 tablet (7.5 mg total) by mouth daily. 30 tablet Worth Kober, Lurena Joiner,  PA-C      PDMP not reviewed this encounter.   Marlow Baars, [place] 01/11/23 1821

## 2023-01-15 DIAGNOSIS — M25562 Pain in left knee: Secondary | ICD-10-CM | POA: Diagnosis not present

## 2023-01-21 DIAGNOSIS — M25562 Pain in left knee: Secondary | ICD-10-CM | POA: Diagnosis not present

## 2023-01-21 DIAGNOSIS — K5010 Crohn's disease of large intestine without complications: Secondary | ICD-10-CM | POA: Diagnosis not present

## 2023-01-28 DIAGNOSIS — H524 Presbyopia: Secondary | ICD-10-CM | POA: Diagnosis not present

## 2023-02-02 DIAGNOSIS — H524 Presbyopia: Secondary | ICD-10-CM | POA: Diagnosis not present

## 2023-03-18 DIAGNOSIS — M79672 Pain in left foot: Secondary | ICD-10-CM | POA: Diagnosis not present

## 2023-03-18 DIAGNOSIS — K50818 Crohn's disease of both small and large intestine with other complication: Secondary | ICD-10-CM | POA: Diagnosis not present

## 2023-03-18 DIAGNOSIS — M0609 Rheumatoid arthritis without rheumatoid factor, multiple sites: Secondary | ICD-10-CM | POA: Diagnosis not present

## 2023-03-18 DIAGNOSIS — M25562 Pain in left knee: Secondary | ICD-10-CM | POA: Diagnosis not present

## 2023-03-25 ENCOUNTER — Ambulatory Visit (INDEPENDENT_AMBULATORY_CARE_PROVIDER_SITE_OTHER): Payer: Medicare Other | Admitting: Family Medicine

## 2023-03-25 ENCOUNTER — Encounter: Payer: Self-pay | Admitting: Family Medicine

## 2023-03-25 VITALS — BP 138/80 | HR 68 | Temp 98.8°F | Ht 66.0 in | Wt 166.0 lb

## 2023-03-25 DIAGNOSIS — N529 Male erectile dysfunction, unspecified: Secondary | ICD-10-CM | POA: Diagnosis not present

## 2023-03-25 DIAGNOSIS — E785 Hyperlipidemia, unspecified: Secondary | ICD-10-CM | POA: Diagnosis not present

## 2023-03-25 DIAGNOSIS — I10 Essential (primary) hypertension: Secondary | ICD-10-CM | POA: Diagnosis not present

## 2023-03-25 DIAGNOSIS — M25562 Pain in left knee: Secondary | ICD-10-CM

## 2023-03-25 DIAGNOSIS — R7303 Prediabetes: Secondary | ICD-10-CM

## 2023-03-25 LAB — COMPREHENSIVE METABOLIC PANEL
ALT: 22 U/L (ref 0–53)
AST: 21 U/L (ref 0–37)
Albumin: 4.7 g/dL (ref 3.5–5.2)
Alkaline Phosphatase: 60 U/L (ref 39–117)
BUN: 17 mg/dL (ref 6–23)
CO2: 28 meq/L (ref 19–32)
Calcium: 9.3 mg/dL (ref 8.4–10.5)
Chloride: 102 meq/L (ref 96–112)
Creatinine, Ser: 1.02 mg/dL (ref 0.40–1.50)
GFR: 76.33 mL/min (ref >60.00)
Glucose, Bld: 90 mg/dL (ref 70–99)
Potassium: 4.2 meq/L (ref 3.5–5.1)
Sodium: 139 meq/L (ref 135–145)
Total Bilirubin: 0.6 mg/dL (ref 0.2–1.2)
Total Protein: 7.2 g/dL (ref 6.0–8.3)

## 2023-03-25 LAB — HEMOGLOBIN A1C: Hgb A1c MFr Bld: 5.8 % (ref 4.6–6.5)

## 2023-03-25 LAB — LIPID PANEL
Cholesterol: 182 mg/dL (ref 0–200)
HDL: 67.5 mg/dL (ref >39.00)
LDL Cholesterol: 99 mg/dL (ref 0–99)
NonHDL: 114.27
Total CHOL/HDL Ratio: 3
Triglycerides: 74 mg/dL (ref 0.0–149.0)
VLDL: 14.8 mg/dL (ref 0.0–40.0)

## 2023-03-25 MED ORDER — SILDENAFIL CITRATE 100 MG PO TABS
50.0000 mg | ORAL_TABLET | Freq: Every day | ORAL | 5 refills | Status: DC | PRN
Start: 2023-03-25 — End: 2023-09-24

## 2023-03-25 MED ORDER — SIMVASTATIN 20 MG PO TABS: ORAL_TABLET | ORAL | 2 refills | Status: DC

## 2023-03-25 MED ORDER — AMLODIPINE BESYLATE 5 MG PO TABS
ORAL_TABLET | ORAL | 2 refills | Status: DC
Start: 2023-03-25 — End: 2023-09-24

## 2023-03-25 NOTE — Patient Instructions (Addendum)
Sorry to hear your knee has continued to be an issue. I do recommend following up with ortho for next step.  [facility] Address: [address], [place], [place] [address] Phone: [phone]  No med changes for now. If any concerns on labs I will let you know. Take care and Happy early Birthday!

## 2023-03-25 NOTE — Progress Notes (Signed)
Subjective:  Patient ID: Phillip Glass, male    DOB: [birth year]  Age: 67 y.o. MRN: [id number]  CC:  Chief Complaint  Patient presents with   Medical Management of Chronic Issues    Pt is doing okay   Knee Pain    Pt had his knee looked at in September, and would like this rechecked today     HPI [name] presents for   Prediabetes: Borderline A1c previously, then improved with dietary and exercise changes. Weight stable.  Lab Results  Component Value Date   HGBA1C 5.5 09/17/2022   Wt Readings from Last 3 Encounters:  03/25/23 166 lb (75.3 kg)  11/02/22 165 lb 12.8 oz (75.2 kg)  09/17/22 167 lb 12.8 oz (76.1 kg)  Home weight 162.6  Hypertension: Amlodipine 5 mg daily, no new side effects.  Home readings: 105/75.  BP Readings from Last 3 Encounters:  03/25/23 138/80  01/11/23 (!) 158/83  11/02/22 134/70   Lab Results  Component Value Date   CREATININE 1.10 09/17/2022   Hyperlipidemia: Simvastatin 20 mg daily, no new myalgias/side effects.  Lab Results  Component Value Date   CHOL 159 09/17/2022   HDL 57.90 09/17/2022   LDLCALC 86 09/17/2022   TRIG 73.0 09/17/2022   CHOLHDL 3 09/17/2022   Lab Results  Component Value Date   ALT 27 09/17/2022   AST 23 09/17/2022   ALKPHOS 61 09/17/2022   BILITOT 0.6 09/17/2022    Left knee pain: Chart reviewed.  Discussed in August.  At that time had pain in the lateral knee after landing from a kick.  Suspicious for possible meniscal tear.  Initial symptomatic treatment discussed along with bracing and activity modification.  X-ray ordered but not completed.  Urgent care visit October 14.  Started on Mobic.  Orthopedic follow-up planned including for possible imaging. Hurts to pivot. Some days feels better - able to walk, as long as not pivoting.  This was also discussed December 19 with his rheumatologist, Dr. Dierdre Forth.no intervention. Planned ortho follow up.  Saw ortho - [facility] about a month ago.  Helped for a few weeks after steroid injection, then pain returned. Medial knee and above kneecap. Had xrays at ortho. Has not called ortho back yet.  Tx: voltaren gel? Some relief at times - variable relief. Occasional ibuprofen.  Still working out - having to adjust regimen with knee.   Erectile dysfunction: 1/2 pill effective, no new side effects, including HA/vision changes, or CP/DOE   History Patient Active Problem List   Diagnosis Date Noted   Polyarthralgia 01/18/2014   Crohn's disease of colon without complication (HCC) 04/08/2012   Multiple nevi 03/03/2012   Past Medical History:  Diagnosis Date   Crohn's colitis [facility])    Past Surgical History:  Procedure Laterality Date   COLONOSCOPY  01/12/2011   TONSILLECTOMY     Allergies  Allergen Reactions   Codeine Other (See Comments)    Pt states he hallucinates   Corylus Hives   Prior to Admission medications   Medication Sig Start Date End Date Taking? Authorizing Provider  Adalimumab (HUMIRA PEN) 40 MG/0.4ML PNKT INJECT 40MG  SUBCUTANEOUSLY  EVERY 2 WEEKS 02/28/21  Yes [provider]  Adalimumab 40 MG/0.8ML PNKT Inject 40 mg into the skin. 05/22/15  Yes [provider]  Aloe Vera 500 MG CAPS Take 1 capsule by mouth daily.   Yes [provider]  amLODipine (NORVASC) 5 MG tablet TAKE 1 TABLET(5 MG) BY MOUTH  DAILY 09/17/22  Yes Shade Flood, MD  Bioflavonoid Products [facility]) TABS Take 1 tablet by mouth daily.   Yes [provider]  folic acid (FOLVITE) 1 MG tablet Take 1 mg by mouth daily.   Yes [provider]  GARLIC PO Take by mouth.   Yes [provider]  meloxicam (MOBIC) 7.5 MG tablet Take 1 tablet (7.5 mg total) by mouth daily. 01/11/23  Yes Rising, Lurena Joiner, PA-C  methotrexate (RHEUMATREX) 2.5 MG tablet Take by mouth.   Yes [provider]  Multiple Vitamin (MULTI-VITAMIN) tablet Take 1 tablet by mouth daily.   Yes [provider]  sildenafil  (VIAGRA) 100 MG tablet Take 0.5-1 tablets (50-100 mg total) by mouth daily as needed for erectile dysfunction. 09/17/22  Yes Shade Flood, MD  simvastatin (ZOCOR) 20 MG tablet TAKE 1 TABLET(20 MG) BY MOUTH AT BEDTIME 09/17/22  Yes Shade Flood, MD   Social History   Socioeconomic History   Marital status: Divorced    Spouse name: Not on file   Number of children: Not on file   Years of education: Not on file   Highest education level: Doctorate  Occupational History   Not on file  Tobacco Use   Smoking status: Never   Smokeless tobacco: Never  Vaping Use   Vaping status: Never Used  Substance and Sexual Activity   Alcohol use: Not Currently    Alcohol/week: 1.0 standard drink of alcohol    Types: 1 Cans of beer per week   Drug use: No   Sexual activity: Not Currently    Birth control/protection: Abstinence  Other Topics Concern   Not on file  Social History Narrative   Marital status: divorced in 2013.  Not dating.  Married x 20 years.      Children:  1 child (22); no grandchild.      Lives: with son.      Employment: Naval architect x 35 years; happy.      Tobacco; none      Alcohol: none      Drugs; none      Exercise:  6 days per week for 45 minutes.  Cardio twice weekly; weightlifting 5 days per week.      Seatbelt: 100%      Guns:  Locked and loaded.   Social Drivers of Corporate investment banker Strain: Low Risk  (03/21/2023)   Overall Financial Resource Strain (CARDIA)    Difficulty of Paying Living Expenses: Not very hard  Food Insecurity: No Food Insecurity (03/21/2023)   Hunger Vital Sign    Worried About Running Out of Food in the Last Year: Never true    Ran Out of Food in the Last Year: Never true  Transportation Needs: No Transportation Needs (03/21/2023)   PRAPARE - Administrator, Civil Service (Medical): No    Lack of Transportation (Non-Medical): No  Physical Activity: Sufficiently Active (03/21/2023)   Exercise Vital Sign     Days of Exercise per Week: 5 days    Minutes of Exercise per Session: 60 min  Stress: No Stress Concern Present (01/07/2023)   [employer] of Occupational Health - Occupational Stress Questionnaire    Feeling of Stress : Not at all  Social Connections: Unknown (03/21/2023)   Social Connection and Isolation Panel [NHANES]    Frequency of Communication with Friends and Family: More than three times a week    Frequency of Social Gatherings with Friends and Family:  More than three times a week    Attends Religious Services: Patient declined    Active Member of Clubs or Organizations: Yes    Attends Banker Meetings: More than 4 times per year    Marital Status: Divorced  Catering manager Violence: Not on file    Review of Systems  Constitutional:  Negative for fatigue and unexpected weight change.  Eyes:  Negative for visual disturbance.  Respiratory:  Negative for cough, chest tightness and shortness of breath.   Cardiovascular:  Negative for chest pain, palpitations and leg swelling.  Gastrointestinal:  Negative for abdominal pain and blood in stool.  Neurological:  Negative for dizziness, light-headedness and headaches.     Objective:   Vitals:   03/25/23 0920  BP: 138/80  Pulse: 68  Temp: 98.8 F (37.1 C)  TempSrc: Temporal  SpO2: 98%  Weight: 166 lb (75.3 kg)  Height: 5\' 6"  (1.676 m)     Physical Exam Vitals reviewed.  Constitutional:      Appearance: He is well-developed.  HENT:     Head: Normocephalic and atraumatic.  Neck:     Vascular: No carotid bruit or JVD.  Cardiovascular:     Rate and Rhythm: Normal rate and regular rhythm.     Heart sounds: Normal heart sounds. No murmur heard. Pulmonary:     Effort: Pulmonary effort is normal.     Breath sounds: Normal breath sounds. No rales.  Musculoskeletal:     Right lower leg: No edema.     Left lower leg: No edema.     Comments: Left knee, intact motion, no lag with straight leg raise.   Trace effusion.  Skin intact without erythema.  Slight medial joint line tenderness.  Negative varus/valgus stress and negative pain on exam with McMurray's at this time.  Skin:    General: Skin is warm and dry.  Neurological:     Mental Status: He is alert and oriented to person, place, and time.  Psychiatric:        Mood and Affect: Mood normal.        Assessment & Plan:  RODIN PULLIUM is a 67 y.o. male . Prediabetes - Plan: Hemoglobin A1c  -Check A1c and adjust plan accordingly, commended on continued exercise and dietary appearance.  Erectile dysfunction, unspecified erectile dysfunction type - Plan: sildenafil (VIAGRA) 100 MG tablet  -Stable with current med regimen.viagra Rx given - use lowest effective dose. Side effects discussed (including but not limited to headache/flushing, blue discoloration of vision, possible vascular steal and risk of cardiac effects if underlying unknown coronary artery disease, and permanent sensorineural hearing loss). Understanding expressed.  Essential hypertension - Plan: amLODipine (NORVASC) 5 MG tablet  -Stable with amlodipine, continue same dosing.  Hyperlipidemia, unspecified hyperlipidemia type - Plan: simvastatin (ZOCOR) 20 MG tablet, Comprehensive metabolic panel, Lipid panel  -Tolerating current med regimen, check labs and adjust plan accordingly, continue Zocor same dose.  Left knee pain, unspecified chronicity  -Suspect underlying meniscal pathology versus degenerative joint disease.  Minimal relief with injection as above.  Recommended he contact Ortho to decide on advanced imaging versus surgical treatment.  Meds ordered this encounter  Medications   sildenafil (VIAGRA) 100 MG tablet    Sig: Take 0.5-1 tablets (50-100 mg total) by mouth daily as needed for erectile dysfunction.    Dispense:  6 tablet    Refill:  5   amLODipine (NORVASC) 5 MG tablet    Sig: TAKE 1 TABLET(5 MG) BY  MOUTH DAILY    Dispense:  90 tablet    Refill:   2   simvastatin (ZOCOR) 20 MG tablet    Sig: TAKE 1 TABLET(20 MG) BY MOUTH AT BEDTIME    Dispense:  90 tablet    Refill:  2   Patient Instructions  Sorry to hear your knee has continued to be an issue. I do recommend following up with ortho for next step.  [facility] Address: [address], [place], [place] [address] Phone: [phone]  No med changes for now. If any concerns on labs I will let you know. Take care and Happy early Birthday!    Signed,   Meredith Staggers, MD [facility], [facility] 03/25/23 10:11 AM

## 2023-03-26 DIAGNOSIS — M25562 Pain in left knee: Secondary | ICD-10-CM | POA: Diagnosis not present

## 2023-04-06 DIAGNOSIS — M25562 Pain in left knee: Secondary | ICD-10-CM | POA: Diagnosis not present

## 2023-04-14 DIAGNOSIS — S83242A Other tear of medial meniscus, current injury, left knee, initial encounter: Secondary | ICD-10-CM | POA: Diagnosis not present

## 2023-04-14 DIAGNOSIS — S72435A Nondisplaced fracture of medial condyle of left femur, initial encounter for closed fracture: Secondary | ICD-10-CM | POA: Diagnosis not present

## 2023-05-17 DIAGNOSIS — M1712 Unilateral primary osteoarthritis, left knee: Secondary | ICD-10-CM | POA: Diagnosis not present

## 2023-05-21 ENCOUNTER — Telehealth: Payer: Self-pay | Admitting: Family Medicine

## 2023-05-21 NOTE — Telephone Encounter (Signed)
 Collected form from front and placed in appropriate folder at nurses station

## 2023-05-21 NOTE — Telephone Encounter (Signed)
 Type of form received: Surgical Clearance Form  Additional comments:   Received by: [employer] - Front Desk  Form should be Faxed/mailed to: (address/ fax #) Fax to [phone]  Is patient requesting call for pickup: N/A  Form placed:  Labeled & placed in provider bin  Attach charge sheet.  Provider will determine charge.  Individual made aware of 3-5 business day turn around? N/A

## 2023-05-24 NOTE — Telephone Encounter (Signed)
 Noted.  Surgical clearance scheduled in 2 days, I have form and will complete at that visit.  Thanks

## 2023-05-26 ENCOUNTER — Ambulatory Visit: Payer: Medicare Other | Admitting: Family Medicine

## 2023-05-26 ENCOUNTER — Encounter: Payer: Self-pay | Admitting: Family Medicine

## 2023-05-26 VITALS — BP 132/80 | HR 65 | Temp 97.8°F | Ht 66.0 in | Wt 173.4 lb

## 2023-05-26 DIAGNOSIS — Z01818 Encounter for other preprocedural examination: Secondary | ICD-10-CM | POA: Diagnosis not present

## 2023-05-26 NOTE — Patient Instructions (Signed)
 I do not see any absolute contraindications or specific concerns with upcoming surgery.  Your specialist should decide if any change in medications around time of surgery or specific monitoring labs are needed.  It looks like they will be checking some labs prior to your surgery, and with recent labs in December I do not think any specific labs are needed at this time.  If you have questions please let me know and good luck!

## 2023-05-26 NOTE — Progress Notes (Signed)
 Subjective:  Patient ID: Phillip Glass, male    DOB: 29-Aug-1955  Age: 68 y.o. MRN: [id number]  CC:  Chief Complaint  Patient presents with   Medical Clearance    Pt is well no concerns, Lt knee surgery    Health Maintenance    Patient has plans to get shingles vaccine at some point in the future, did not have COVID-19 vaccine in 2024     HPI Phillip Glass presents for   Preop evaluation: Paperwork received from Dr. Eulah Pont, plans for left partial knee arthroplasty with spinal anesthesia.  Per paperwork they will be ordering CBC, BMP, hemoglobin A1c if diabetic.  Surgery date TBD.   He is on methotrexate for rheumatoid arthritis, as well as Humira, followed by rheumatology, Dr. Dierdre Forth.  Followed by gastroenterology with history of Crohn's disease, history of hypertension without known history of CAD, MI, CVA or TIA and no known CKD.  He is on statin for hyperlipidemia, history of prediabetes.  A1c 5.8 in December.CMP normal in December including creatinine 1.02. reports that surgeon has reached out to GI and rheumatology for clearance. Will have bloodwork done at rheum as well.  No UTI sx's  No recent surgery. Sedation with colonoscopy without issue.   No CP/sob with 2 city blocks, or 2 flights of stairs.   No hx of OSA.     History Patient Active Problem List   Diagnosis Date Noted   Polyarthralgia 01/18/2014   Crohn's disease of colon without complication (HCC) 04/08/2012   Multiple nevi 03/03/2012   Past Medical History:  Diagnosis Date   Crohn's colitis [facility])    Past Surgical History:  Procedure Laterality Date   COLONOSCOPY  01/12/2011   TONSILLECTOMY     Allergies  Allergen Reactions   Codeine Other (See Comments)    Pt states he hallucinates   Corylus Hives   Prior to Admission medications   Medication Sig Start Date End Date Taking? Authorizing Provider  Adalimumab (HUMIRA PEN) 40 MG/0.4ML PNKT INJECT 40MG  SUBCUTANEOUSLY  EVERY 2 WEEKS 02/28/21  Yes  [provider]  Adalimumab 40 MG/0.8ML PNKT Inject 40 mg into the skin. 05/22/15  Yes [provider]  Aloe Vera 500 MG CAPS Take 1 capsule by mouth daily.   Yes [provider]  amLODipine (NORVASC) 5 MG tablet TAKE 1 TABLET(5 MG) BY MOUTH DAILY 03/25/23  Yes Shade Flood, MD  Bioflavonoid Products (BIOFLEX) TABS Take 1 tablet by mouth daily.   Yes [provider]  folic acid (FOLVITE) 1 MG tablet Take 1 mg by mouth daily.   Yes [provider]  GARLIC PO Take by mouth.   Yes [provider]  meloxicam (MOBIC) 7.5 MG tablet Take 1 tablet (7.5 mg total) by mouth daily. 01/11/23  Yes Rising, Lurena Joiner, PA-C  methotrexate (RHEUMATREX) 2.5 MG tablet Take by mouth.   Yes [provider]  Multiple Vitamin (MULTI-VITAMIN) tablet Take 1 tablet by mouth daily.   Yes [provider]  sildenafil (VIAGRA) 100 MG tablet Take 0.5-1 tablets (50-100 mg total) by mouth daily as needed for erectile dysfunction. 03/25/23  Yes Shade Flood, MD  simvastatin (ZOCOR) 20 MG tablet TAKE 1 TABLET(20 MG) BY MOUTH AT BEDTIME 03/25/23  Yes Shade Flood, MD   Social History   Socioeconomic History   Marital status: Divorced    Spouse name: Not on file   Number of children: Not on file   Years of education: Not on  file   Highest education level: Doctorate  Occupational History   Not on file  Tobacco Use   Smoking status: Never   Smokeless tobacco: Never  Vaping Use   Vaping status: Never Used  Substance and Sexual Activity   Alcohol use: Not Currently    Alcohol/week: 1.0 standard drink of alcohol    Types: 1 Cans of beer per week   Drug use: No   Sexual activity: Not Currently    Birth control/protection: Abstinence  Other Topics Concern   Not on file  Social History Narrative   Marital status: divorced in 2013.  Not dating.  Married x 20 years.      Children:  1 child (22); no grandchild.      Lives: with son.       Employment: Naval architect x 35 years; happy.      Tobacco; none      Alcohol: none      Drugs; none      Exercise:  6 days per week for 45 minutes.  Cardio twice weekly; weightlifting 5 days per week.      Seatbelt: 100%      Guns:  Locked and loaded.   Social Drivers of Corporate investment banker Strain: Low Risk  (03/21/2023)   Overall Financial Resource Strain (CARDIA)    Difficulty of Paying Living Expenses: Not very hard  Food Insecurity: No Food Insecurity (03/21/2023)   Hunger Vital Sign    Worried About Running Out of Food in the Last Year: Never true    Ran Out of Food in the Last Year: Never true  Transportation Needs: No Transportation Needs (03/21/2023)   PRAPARE - Administrator, Civil Service (Medical): No    Lack of Transportation (Non-Medical): No  Physical Activity: Sufficiently Active (03/21/2023)   Exercise Vital Sign    Days of Exercise per Week: 5 days    Minutes of Exercise per Session: 60 min  Stress: No Stress Concern Present (01/07/2023)   [employer] of Occupational Health - Occupational Stress Questionnaire    Feeling of Stress : Not at all  Social Connections: Unknown (03/21/2023)   Social Connection and Isolation Panel [NHANES]    Frequency of Communication with Friends and Family: More than three times a week    Frequency of Social Gatherings with Friends and Family: More than three times a week    Attends Religious Services: Patient declined    Database administrator or Organizations: Yes    Attends Engineer, structural: More than 4 times per year    Marital Status: Divorced  Catering manager Violence: Not on file    Review of Systems  Per HPI.  Objective:   Vitals:   05/26/23 0806 05/26/23 0816  BP: (!) 140/80 132/80  Pulse: 65   Temp: 97.8 F (36.6 C)   TempSrc: Temporal   SpO2: 98%   Weight: 173 lb 6.4 oz (78.7 kg)   Height: 5\' 6"  (1.676 m)      Physical Exam Vitals reviewed.  Constitutional:       Appearance: He is well-developed.  HENT:     Head: Normocephalic and atraumatic.  Neck:     Vascular: No carotid bruit or JVD.  Cardiovascular:     Rate and Rhythm: Normal rate and regular rhythm.     Heart sounds: Normal heart sounds. No murmur heard.    Comments: No murmur/third heart sound. Pulmonary:     Effort: Pulmonary  effort is normal.     Breath sounds: Normal breath sounds. No rales.  Musculoskeletal:     Right lower leg: No edema.     Left lower leg: No edema.  Skin:    General: Skin is warm and dry.  Neurological:     Mental Status: He is alert and oriented to person, place, and time.  Psychiatric:        Mood and Affect: Mood normal.    EKG: Sinus rhythm, rate 62, no acute findings.  Compared to 03/03/2012, no apparent acute findings or significant changes.  Assessment & Plan:  JAHNI NAZAR is a 68 y.o. male . Preoperative evaluation to rule out surgical contraindication - Plan: EKG 12-Lead Level RCRI/Goldman risk score, no cardiac symptoms with activity as above.  History of prediabetes, hypertension, hyperlipidemia but overall stable on most recent labs.  Specifics regarding his medications perioperatively from a rheumatologic gastroenterology standpoint can be determined from his specialist, and reports that orthopedics has requested information from them as well.  I do not see any absolute contraindications and appears to be acceptable risk from medical standpoint for planned procedure.  Will complete paperwork for surgeon.  No orders of the defined types were placed in this encounter.  Patient Instructions  I do not see any absolute contraindications or specific concerns with upcoming surgery.  Your specialist should decide if any change in medications around time of surgery or specific monitoring labs are needed.  It looks like they will be checking some labs prior to your surgery, and with recent labs in December I do not think any specific labs are needed  at this time.  If you have questions please let me know and good luck!    Signed,   Meredith Staggers, MD [facility], [facility] 05/26/23 8:29 AM

## 2023-05-27 ENCOUNTER — Telehealth: Payer: Self-pay | Admitting: Family Medicine

## 2023-05-27 NOTE — Telephone Encounter (Signed)
 Could not find phone call or other messages. No call back needed

## 2023-05-27 NOTE — Telephone Encounter (Signed)
 Copied from CRM [id number]. Topic: General - Other >> May 27, 2023  7:58 AM Gurney Maxin H wrote: Reason for CRM: Patient states he missed a call from clinic, agent not finding any notes as to who or why patient was called. Please reach back out to patient, thanks.  Kraig [phone]  Called Mr Alicea this call is a return call for [facility] Although I didn't find documentation that's why he was returning the call.

## 2023-05-28 NOTE — Telephone Encounter (Signed)
 Patient returned my call and explained that he came in for his appt for his knee pain and he was 2 min late past the ten minute mark. He stated he understands that we have policies but it was only 2 minutes. He stated that he just didn't like the way the front desk staff handled the situation. He stated she seemed "cold" and wasn't professional. He stated there was no one in the lobby waiting and he had already completed the e-check in. He stated that the front desk staff was busy eating her snack and it really pissed him off because he didn't feel important and he's waited 30+ minutes for Dr. Neva Seat before and he stated being 2 minutes late and having to be rescheduled was crazy to him. I empathized with him and apologized for his experience and assured him I would address this as this is not the culture I promote here. I did explain to him how the ten minute late window should work and if a patient is past the ten minute mark the front desk is to let you know you're past it but then offer to look at the schedule to see if there are any other openings and then if there isn't, they will reschedule. He stated that everyone in the office has been great and Dr. Neva Seat is the best doctor, he's only had an issue with this front desk staff member this one time. 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 He thanked me for my call and I thanked him for letting me know about this and thanked him for his time.

## 2023-06-01 DIAGNOSIS — M0609 Rheumatoid arthritis without rheumatoid factor, multiple sites: Secondary | ICD-10-CM | POA: Diagnosis not present

## 2023-06-17 ENCOUNTER — Ambulatory Visit: Payer: Medicare Other | Admitting: Family Medicine

## 2023-06-23 DIAGNOSIS — M25562 Pain in left knee: Secondary | ICD-10-CM | POA: Diagnosis not present

## 2023-06-23 DIAGNOSIS — M1712 Unilateral primary osteoarthritis, left knee: Secondary | ICD-10-CM | POA: Diagnosis not present

## 2023-07-05 DIAGNOSIS — M1712 Unilateral primary osteoarthritis, left knee: Secondary | ICD-10-CM | POA: Diagnosis not present

## 2023-07-08 DIAGNOSIS — Z96652 Presence of left artificial knee joint: Secondary | ICD-10-CM | POA: Diagnosis not present

## 2023-07-08 DIAGNOSIS — G8918 Other acute postprocedural pain: Secondary | ICD-10-CM | POA: Diagnosis not present

## 2023-07-08 DIAGNOSIS — M25762 Osteophyte, left knee: Secondary | ICD-10-CM | POA: Diagnosis not present

## 2023-07-08 DIAGNOSIS — M1712 Unilateral primary osteoarthritis, left knee: Secondary | ICD-10-CM | POA: Diagnosis not present

## 2023-07-08 HISTORY — PX: PARTIAL KNEE ARTHROPLASTY: SHX2174

## 2023-07-09 DIAGNOSIS — M1712 Unilateral primary osteoarthritis, left knee: Secondary | ICD-10-CM | POA: Diagnosis not present

## 2023-07-09 DIAGNOSIS — Z96652 Presence of left artificial knee joint: Secondary | ICD-10-CM | POA: Diagnosis not present

## 2023-07-10 DIAGNOSIS — Z96652 Presence of left artificial knee joint: Secondary | ICD-10-CM | POA: Diagnosis not present

## 2023-07-10 DIAGNOSIS — M1712 Unilateral primary osteoarthritis, left knee: Secondary | ICD-10-CM | POA: Diagnosis not present

## 2023-07-11 DIAGNOSIS — M1712 Unilateral primary osteoarthritis, left knee: Secondary | ICD-10-CM | POA: Diagnosis not present

## 2023-07-11 DIAGNOSIS — Z96652 Presence of left artificial knee joint: Secondary | ICD-10-CM | POA: Diagnosis not present

## 2023-07-12 DIAGNOSIS — Z96652 Presence of left artificial knee joint: Secondary | ICD-10-CM | POA: Diagnosis not present

## 2023-07-12 DIAGNOSIS — M1712 Unilateral primary osteoarthritis, left knee: Secondary | ICD-10-CM | POA: Diagnosis not present

## 2023-07-13 DIAGNOSIS — Z96652 Presence of left artificial knee joint: Secondary | ICD-10-CM | POA: Diagnosis not present

## 2023-07-13 DIAGNOSIS — M1712 Unilateral primary osteoarthritis, left knee: Secondary | ICD-10-CM | POA: Diagnosis not present

## 2023-07-14 DIAGNOSIS — M1712 Unilateral primary osteoarthritis, left knee: Secondary | ICD-10-CM | POA: Diagnosis not present

## 2023-07-14 DIAGNOSIS — Z96652 Presence of left artificial knee joint: Secondary | ICD-10-CM | POA: Diagnosis not present

## 2023-07-15 DIAGNOSIS — M1712 Unilateral primary osteoarthritis, left knee: Secondary | ICD-10-CM | POA: Diagnosis not present

## 2023-07-15 DIAGNOSIS — Z96652 Presence of left artificial knee joint: Secondary | ICD-10-CM | POA: Diagnosis not present

## 2023-07-16 DIAGNOSIS — M1712 Unilateral primary osteoarthritis, left knee: Secondary | ICD-10-CM | POA: Diagnosis not present

## 2023-07-16 DIAGNOSIS — Z96652 Presence of left artificial knee joint: Secondary | ICD-10-CM | POA: Diagnosis not present

## 2023-07-17 DIAGNOSIS — M1712 Unilateral primary osteoarthritis, left knee: Secondary | ICD-10-CM | POA: Diagnosis not present

## 2023-07-17 DIAGNOSIS — Z96652 Presence of left artificial knee joint: Secondary | ICD-10-CM | POA: Diagnosis not present

## 2023-07-18 DIAGNOSIS — Z96652 Presence of left artificial knee joint: Secondary | ICD-10-CM | POA: Diagnosis not present

## 2023-07-18 DIAGNOSIS — M1712 Unilateral primary osteoarthritis, left knee: Secondary | ICD-10-CM | POA: Diagnosis not present

## 2023-07-19 DIAGNOSIS — M1712 Unilateral primary osteoarthritis, left knee: Secondary | ICD-10-CM | POA: Diagnosis not present

## 2023-07-19 DIAGNOSIS — Z96652 Presence of left artificial knee joint: Secondary | ICD-10-CM | POA: Diagnosis not present

## 2023-07-20 DIAGNOSIS — Z96652 Presence of left artificial knee joint: Secondary | ICD-10-CM | POA: Diagnosis not present

## 2023-07-20 DIAGNOSIS — M1712 Unilateral primary osteoarthritis, left knee: Secondary | ICD-10-CM | POA: Diagnosis not present

## 2023-07-21 DIAGNOSIS — M1712 Unilateral primary osteoarthritis, left knee: Secondary | ICD-10-CM | POA: Diagnosis not present

## 2023-07-21 DIAGNOSIS — Z96652 Presence of left artificial knee joint: Secondary | ICD-10-CM | POA: Diagnosis not present

## 2023-07-22 DIAGNOSIS — M1712 Unilateral primary osteoarthritis, left knee: Secondary | ICD-10-CM | POA: Diagnosis not present

## 2023-07-22 DIAGNOSIS — Z96652 Presence of left artificial knee joint: Secondary | ICD-10-CM | POA: Diagnosis not present

## 2023-07-23 DIAGNOSIS — Z96652 Presence of left artificial knee joint: Secondary | ICD-10-CM | POA: Diagnosis not present

## 2023-07-23 DIAGNOSIS — M1712 Unilateral primary osteoarthritis, left knee: Secondary | ICD-10-CM | POA: Diagnosis not present

## 2023-07-24 DIAGNOSIS — M1712 Unilateral primary osteoarthritis, left knee: Secondary | ICD-10-CM | POA: Diagnosis not present

## 2023-07-24 DIAGNOSIS — Z96652 Presence of left artificial knee joint: Secondary | ICD-10-CM | POA: Diagnosis not present

## 2023-07-25 DIAGNOSIS — Z96652 Presence of left artificial knee joint: Secondary | ICD-10-CM | POA: Diagnosis not present

## 2023-07-25 DIAGNOSIS — M1712 Unilateral primary osteoarthritis, left knee: Secondary | ICD-10-CM | POA: Diagnosis not present

## 2023-07-26 DIAGNOSIS — M1712 Unilateral primary osteoarthritis, left knee: Secondary | ICD-10-CM | POA: Diagnosis not present

## 2023-07-26 DIAGNOSIS — Z96652 Presence of left artificial knee joint: Secondary | ICD-10-CM | POA: Diagnosis not present

## 2023-07-27 DIAGNOSIS — Z96652 Presence of left artificial knee joint: Secondary | ICD-10-CM | POA: Diagnosis not present

## 2023-07-27 DIAGNOSIS — M1712 Unilateral primary osteoarthritis, left knee: Secondary | ICD-10-CM | POA: Diagnosis not present

## 2023-07-28 DIAGNOSIS — Z96652 Presence of left artificial knee joint: Secondary | ICD-10-CM | POA: Diagnosis not present

## 2023-07-28 DIAGNOSIS — M1712 Unilateral primary osteoarthritis, left knee: Secondary | ICD-10-CM | POA: Diagnosis not present

## 2023-07-29 DIAGNOSIS — K5010 Crohn's disease of large intestine without complications: Secondary | ICD-10-CM | POA: Diagnosis not present

## 2023-08-02 DIAGNOSIS — M1712 Unilateral primary osteoarthritis, left knee: Secondary | ICD-10-CM | POA: Diagnosis not present

## 2023-08-04 DIAGNOSIS — M1712 Unilateral primary osteoarthritis, left knee: Secondary | ICD-10-CM | POA: Diagnosis not present

## 2023-08-09 DIAGNOSIS — M1712 Unilateral primary osteoarthritis, left knee: Secondary | ICD-10-CM | POA: Diagnosis not present

## 2023-08-11 DIAGNOSIS — M1712 Unilateral primary osteoarthritis, left knee: Secondary | ICD-10-CM | POA: Diagnosis not present

## 2023-08-16 DIAGNOSIS — M1712 Unilateral primary osteoarthritis, left knee: Secondary | ICD-10-CM | POA: Diagnosis not present

## 2023-08-18 DIAGNOSIS — M1712 Unilateral primary osteoarthritis, left knee: Secondary | ICD-10-CM | POA: Diagnosis not present

## 2023-08-25 DIAGNOSIS — M1712 Unilateral primary osteoarthritis, left knee: Secondary | ICD-10-CM | POA: Diagnosis not present

## 2023-08-30 DIAGNOSIS — M1712 Unilateral primary osteoarthritis, left knee: Secondary | ICD-10-CM | POA: Diagnosis not present

## 2023-09-01 DIAGNOSIS — M1712 Unilateral primary osteoarthritis, left knee: Secondary | ICD-10-CM | POA: Diagnosis not present

## 2023-09-06 DIAGNOSIS — M1712 Unilateral primary osteoarthritis, left knee: Secondary | ICD-10-CM | POA: Diagnosis not present

## 2023-09-08 DIAGNOSIS — M1712 Unilateral primary osteoarthritis, left knee: Secondary | ICD-10-CM | POA: Diagnosis not present

## 2023-09-13 DIAGNOSIS — M1712 Unilateral primary osteoarthritis, left knee: Secondary | ICD-10-CM | POA: Diagnosis not present

## 2023-09-15 DIAGNOSIS — M1712 Unilateral primary osteoarthritis, left knee: Secondary | ICD-10-CM | POA: Diagnosis not present

## 2023-09-23 DIAGNOSIS — M170 Bilateral primary osteoarthritis of knee: Secondary | ICD-10-CM | POA: Diagnosis not present

## 2023-09-23 DIAGNOSIS — K50818 Crohn's disease of both small and large intestine with other complication: Secondary | ICD-10-CM | POA: Diagnosis not present

## 2023-09-23 DIAGNOSIS — M0609 Rheumatoid arthritis without rheumatoid factor, multiple sites: Secondary | ICD-10-CM | POA: Diagnosis not present

## 2023-09-23 DIAGNOSIS — M79672 Pain in left foot: Secondary | ICD-10-CM | POA: Diagnosis not present

## 2023-09-24 ENCOUNTER — Ambulatory Visit (INDEPENDENT_AMBULATORY_CARE_PROVIDER_SITE_OTHER): Payer: Medicare Other | Admitting: Family Medicine

## 2023-09-24 ENCOUNTER — Encounter: Payer: Self-pay | Admitting: Family Medicine

## 2023-09-24 VITALS — BP 124/66 | HR 67 | Temp 98.3°F | Resp 13 | Ht 65.25 in | Wt 164.6 lb

## 2023-09-24 DIAGNOSIS — M1990 Unspecified osteoarthritis, unspecified site: Secondary | ICD-10-CM | POA: Insufficient documentation

## 2023-09-24 DIAGNOSIS — N529 Male erectile dysfunction, unspecified: Secondary | ICD-10-CM

## 2023-09-24 DIAGNOSIS — M179 Osteoarthritis of knee, unspecified: Secondary | ICD-10-CM | POA: Insufficient documentation

## 2023-09-24 DIAGNOSIS — E785 Hyperlipidemia, unspecified: Secondary | ICD-10-CM

## 2023-09-24 DIAGNOSIS — Z0000 Encounter for general adult medical examination without abnormal findings: Secondary | ICD-10-CM | POA: Diagnosis not present

## 2023-09-24 DIAGNOSIS — R7303 Prediabetes: Secondary | ICD-10-CM | POA: Diagnosis not present

## 2023-09-24 DIAGNOSIS — I10 Essential (primary) hypertension: Secondary | ICD-10-CM | POA: Diagnosis not present

## 2023-09-24 DIAGNOSIS — E66811 Obesity, class 1: Secondary | ICD-10-CM | POA: Insufficient documentation

## 2023-09-24 DIAGNOSIS — M069 Rheumatoid arthritis, unspecified: Secondary | ICD-10-CM | POA: Insufficient documentation

## 2023-09-24 LAB — LIPID PANEL
Cholesterol: 182 mg/dL (ref 0–200)
HDL: 65.1 mg/dL (ref >39.00)
LDL Cholesterol: 101 mg/dL — ABNORMAL HIGH (ref 0–99)
NonHDL: 116.45
Total CHOL/HDL Ratio: 3
Triglycerides: 76 mg/dL (ref 0.0–149.0)
VLDL: 15.2 mg/dL (ref 0.0–40.0)

## 2023-09-24 LAB — HEMOGLOBIN A1C: Hgb A1c MFr Bld: 5.7 % (ref 4.6–6.5)

## 2023-09-24 MED ORDER — AMLODIPINE BESYLATE 5 MG PO TABS: ORAL_TABLET | ORAL | 2 refills | Status: DC

## 2023-09-24 MED ORDER — SILDENAFIL CITRATE 100 MG PO TABS: 50.0000 mg | ORAL_TABLET | Freq: Every day | ORAL | 5 refills | Status: DC | PRN

## 2023-09-24 MED ORDER — SIMVASTATIN 20 MG PO TABS: ORAL_TABLET | ORAL | 2 refills | Status: DC

## 2023-09-24 NOTE — Patient Instructions (Signed)
 Thank you for coming in today. No change in medications at this time. If there are any concerns on your bloodwork, I will let you know. Take care!  Preventive Care 23 Years and Older, Male Preventive care refers to lifestyle choices and visits with your health care provider that can promote health and wellness. Preventive care visits are also called wellness exams. What can I expect for my preventive care visit? Counseling During your preventive care visit, your health care provider may ask about your: Medical history, including: Past medical problems. Family medical history. History of falls. Current health, including: Emotional well-being. Home life and relationship well-being. Sexual activity. Memory and ability to understand (cognition). Lifestyle, including: Alcohol, nicotine or tobacco, and drug use. Access to firearms. Diet, exercise, and sleep habits. Work and work Astronomer. Sunscreen use. Safety issues such as seatbelt and bike helmet use. Physical exam Your health care provider will check your: Height and weight. These may be used to calculate your BMI (body mass index). BMI is a measurement that tells if you are at a healthy weight. Waist circumference. This measures the distance around your waistline. This measurement also tells if you are at a healthy weight and may help predict your risk of certain diseases, such as type 2 diabetes and high blood pressure. Heart rate and blood pressure. Body temperature. Skin for abnormal spots. What immunizations do I need?  Vaccines are usually given at various ages, according to a schedule. Your health care provider will recommend vaccines for you based on your age, medical history, and lifestyle or other factors, such as travel or where you work. What tests do I need? Screening Your health care provider may recommend screening tests for certain conditions. This may include: Lipid and cholesterol levels. Diabetes screening.  This is done by checking your blood sugar (glucose) after you have not eaten for a while (fasting). Hepatitis C test. Hepatitis B test. HIV (human immunodeficiency virus) test. STI (sexually transmitted infection) testing, if you are at risk. Lung cancer screening. Colorectal cancer screening. Prostate cancer screening. Abdominal aortic aneurysm (AAA) screening. You may need this if you are a current or former smoker. Talk with your health care provider about your test results, treatment options, and if necessary, the need for more tests. Follow these instructions at home: Eating and drinking  Eat a diet that includes fresh fruits and vegetables, whole grains, lean protein, and low-fat dairy products. Limit your intake of foods with high amounts of sugar, saturated fats, and salt. Take vitamin and mineral supplements as recommended by your health care provider. Do not drink alcohol if your health care provider tells you not to drink. If you drink alcohol: Limit how much you have to 0-2 drinks a day. Know how much alcohol is in your drink. In the [place] one drink equals one 12 oz bottle of beer (355 mL), one 5 oz glass of wine (148 mL), or one 1 oz glass of hard liquor (44 mL). Lifestyle Brush your teeth every morning and night with fluoride toothpaste. Floss one time each day. Exercise for at least 30 minutes 5 or more days each week. Do not use any products that contain nicotine or tobacco. These products include cigarettes, chewing tobacco, and vaping devices, such as e-cigarettes. If you need help quitting, ask your health care provider. Do not use drugs. If you are sexually active, practice safe sex. Use a condom or other form of protection to prevent STIs. Take aspirin only as told by your health  care provider. Make sure that you understand how much to take and what form to take. Work with your health care provider to find out whether it is safe and beneficial for you to take aspirin  daily. Ask your health care provider if you need to take a cholesterol-lowering medicine (statin). Find healthy ways to manage stress, such as: Meditation, yoga, or listening to music. Journaling. Talking to a trusted person. Spending time with friends and family. Safety Always wear your seat belt while driving or riding in a vehicle. Do not drive: If you have been drinking alcohol. Do not ride with someone who has been drinking. When you are tired or distracted. While texting. If you have been using any mind-altering substances or drugs. Wear a helmet and other protective equipment during sports activities. If you have firearms in your house, make sure you follow all gun safety procedures. Minimize exposure to UV radiation to reduce your risk of skin cancer. What's next? Visit your health care provider once a year for an annual wellness visit. Ask your health care provider how often you should have your eyes and teeth checked. Stay up to date on all vaccines. This information is not intended to replace advice given to you by your health care provider. Make sure you discuss any questions you have with your health care provider. Document Revised: 09/11/2020 Document Reviewed: 09/11/2020 Elsevier Patient Education  2024 [employer].

## 2023-09-24 NOTE — Progress Notes (Signed)
 Subjective:  Patient ID: Phillip Glass, male    DOB: 03/10/1956  Age: 68 y.o. MRN: [id number]  CC:  Chief Complaint  Patient presents with   Annual Exam    Pt notes doing okay, no questions, pt is fasting    HPI Phillip Glass presents for Annual Exam  PCP, me Rheumatology, Dr. Mai, rheumatoid arthritis, treated with Humira, methotrexate.Office visit yesterday. Gastroenterology, Dr. Landy,  history of Crohn's disease. Ortho, Dr. Rubie, Dr. Beverley - most recently treated for osteoarthritis of left knee. L knee replacement 4/10.   Hypertension: Amlodipine  5 mg daily, no new side effects.  Home readings: 131/68, 125/70.  BP Readings from Last 3 Encounters:  09/24/23 124/66  05/26/23 132/80  03/25/23 138/80   Lab Results  Component Value Date   CREATININE 1.02 03/25/2023   Hyperlipidemia: Zocor  20 mg daily. No new myalgia/side effects. Recent CMP has been ordered.  Lab Results  Component Value Date   CHOL 182 03/25/2023   HDL 67.50 03/25/2023   LDLCALC 99 03/25/2023   TRIG 74.0 03/25/2023   CHOLHDL 3 03/25/2023   Lab Results  Component Value Date   ALT 22 03/25/2023   AST 21 03/25/2023   ALKPHOS 60 03/25/2023   BILITOT 0.6 03/25/2023   Erectile dysfunction Treated with sildenafil  100 mg 1/2-1 as needed. 1/2 effective. Denies any side effects such as headache or flushing.  Denies vision or hearing changes or chest pain/dyspnea with exertion.      09/24/2023    8:41 AM 05/26/2023    8:06 AM 09/17/2022    9:16 AM 03/12/2022    9:18 AM 09/04/2021    9:30 AM  Depression screen PHQ 2/9  Decreased Interest 0 0 0 0 0  Down, Depressed, Hopeless 0 0 0 0 0  PHQ - 2 Score 0 0 0 0 0  Altered sleeping 0 0 0 0   Tired, decreased energy 0 0 0 0   Change in appetite 0 0 0 0   Feeling bad or failure about yourself  0 0 0 0   Trouble concentrating 0 0 0 0   Moving slowly or fidgety/restless 0 0 0 0   Suicidal thoughts 0 0 0 0   PHQ-9 Score 0 0 0 0   Difficult doing  work/chores Not difficult at all        Health Maintenance  Topic Date Due   Medicare Annual Wellness (AWV)  Never done   Zoster Vaccines- Shingrix (1 of 2) Never done   COVID-19 Vaccine (3 - Pfizer risk series) 10/18/2019   INFLUENZA VACCINE  10/29/2023   Pneumococcal Vaccine: 50+ Years (3 of 3 - PCV20 or PCV21) 09/05/2026   DTaP/Tdap/Td (2 - Td or Tdap) 06/25/2027   Colonoscopy  10/30/2029   Hepatitis C Screening  Completed   Hepatitis B Vaccines  Aged Out   HPV VACCINES  Aged Out   Meningococcal B Vaccine  Aged Out  Colonoscopy with GI in 2021 [facility].   Prostate: does not  have family history of prostate cancer The natural history of prostate cancer and ongoing controversy regarding screening and potential treatment outcomes of prostate cancer has been discussed with the patient. The meaning of a false positive PSA and a false negative PSA has been discussed. He indicates understanding of the limitations of this screening test and wishes NOT to proceed with screening PSA testing. Lab Results  Component Value Date   PSA1 0.9 06/24/2017   PSA 2.92 03/03/2012  Immunization History  Administered Date(s) Administered   Fluad Trivalent(High Dose 65+) 12/02/2022   Influenza,inj,Quad PF,6+ Mos 01/22/2014, 01/18/2015, 01/29/2016, 01/08/2017, 01/15/2018, 12/22/2018, 11/30/2019, 02/03/2021, 12/19/2021   PFIZER(Purple Top)SARS-COV-2 Vaccination 08/30/2019, 09/20/2019   Pneumococcal Conjugate-13 09/04/2021   Pneumococcal Polysaccharide-23 12/11/2015   Tdap 06/24/2017  Shingrix - considering at pharmacy Covid vaccine - declines.  Flu vaccine in fall.   No results found. Optho - December.   Dental: every 6 months - in July.   Alcohol: none  Tobacco: none  Exercise: 5-6 times per week, cardio and strength strength training.   Prediabetes:  Lab Results  Component Value Date   HGBA1C 5.8 03/25/2023   Wt Readings from Last 3 Encounters:  09/24/23 164 lb 9.6 oz (74.7 kg)   05/26/23 173 lb 6.4 oz (78.7 kg)  03/25/23 166 lb (75.3 kg)     History Patient Active Problem List   Diagnosis Date Noted   Class 1 obesity 09/24/2023   Osteoarthritis of knee 09/24/2023   Inflammatory arthritis 09/24/2023   Rheumatoid arthritis (HCC) 09/24/2023   Polyarthralgia 01/18/2014   Crohn's disease of colon without complication (HCC) 04/08/2012   Multiple nevi 03/03/2012   Past Medical History:  Diagnosis Date   Crohn's colitis [facility])    Past Surgical History:  Procedure Laterality Date   COLONOSCOPY  01/12/2011   PARTIAL KNEE ARTHROPLASTY Left 07/08/2023   TONSILLECTOMY     Allergies  Allergen Reactions   Codeine Other (See Comments)    Pt states he hallucinates   Corylus Hives   Prior to Admission medications   Medication Sig Start Date End Date Taking? Authorizing Provider  Adalimumab (HUMIRA PEN) 40 MG/0.4ML PNKT INJECT 40MG  SUBCUTANEOUSLY  EVERY 2 WEEKS 02/28/21  Yes [provider]  Adalimumab 40 MG/0.8ML PNKT Inject 40 mg into the skin. 05/22/15  Yes [provider]  Aloe Vera 500 MG CAPS Take 1 capsule by mouth daily.   Yes [provider]  amLODipine  (NORVASC ) 5 MG tablet TAKE 1 TABLET(5 MG) BY MOUTH DAILY 03/25/23  Yes Phillip Reyes SAUNDERS, MD  Bioflavonoid Products (BIOFLEX) TABS Take 1 tablet by mouth daily.   Yes [provider]  folic acid (FOLVITE) 1 MG tablet Take 1 mg by mouth daily.   Yes [provider]  GARLIC PO Take by mouth.   Yes [provider]  methotrexate (RHEUMATREX) 2.5 MG tablet Take by mouth.   Yes [provider]  Multiple Vitamin (MULTI-VITAMIN) tablet Take 1 tablet by mouth daily.   Yes [provider]  sildenafil  (VIAGRA ) 100 MG tablet Take 0.5-1 tablets (50-100 mg total) by mouth daily as needed for erectile dysfunction. 03/25/23  Yes Phillip Reyes SAUNDERS, MD  simvastatin  (ZOCOR ) 20 MG tablet TAKE 1 TABLET(20 MG) BY MOUTH AT BEDTIME 03/25/23  Yes Phillip Reyes SAUNDERS, MD   Social History   Socioeconomic History   Marital status: Divorced    Spouse name: Not on file   Number of children: Not on file   Years of education: Not on file   Highest education level: Associate degree: academic program  Occupational History   Not on file  Tobacco Use   Smoking status: Never   Smokeless tobacco: Never  Vaping Use   Vaping status: Never Used  Substance and Sexual Activity   Alcohol use: Not Currently    Alcohol/week: 1.0 standard drink of alcohol    Types: 1 Cans of beer per week   Drug use: No   Sexual activity: Not  Currently    Birth control/protection: Abstinence  Other Topics Concern   Not on file  Social History Narrative   Marital status: divorced in 2013.  Not dating.  Married x 20 years.      Children:  1 child (22); no grandchild.      Lives: with son.      Employment: Naval architect x 35 years; happy.      Tobacco; none      Alcohol: none      Drugs; none      Exercise:  6 days per week for 45 minutes.  Cardio twice weekly; weightlifting 5 days per week.      Seatbelt: 100%      Guns:  Locked and loaded.   Social Drivers of Health   Financial Resource Strain: Patient Declined (09/20/2023)   Overall Financial Resource Strain (CARDIA)    Difficulty of Paying Living Expenses: Patient declined  Food Insecurity: Patient Declined (09/20/2023)   Hunger Vital Sign    Worried About Running Out of Food in the Last Year: Patient declined    Ran Out of Food in the Last Year: Patient declined  Transportation Needs: Patient Declined (09/20/2023)   PRAPARE - Administrator, Civil Service (Medical): Patient declined    Lack of Transportation (Non-Medical): Patient declined  Physical Activity: Sufficiently Active (09/20/2023)   Exercise Vital Sign    Days of Exercise per Week: 5 days    Minutes of Exercise per Session: 60 min  Stress: No Stress Concern Present (09/20/2023)   [employer] of Occupational Health - Occupational  Stress Questionnaire    Feeling of Stress: Not at all  Social Connections: Unknown (09/20/2023)   Social Connection and Isolation Panel    Frequency of Communication with Friends and Family: More than three times a week    Frequency of Social Gatherings with Friends and Family: More than three times a week    Attends Religious Services: Patient declined    Database administrator or Organizations: Yes    Attends Banker Meetings: 1 to 4 times per year    Marital Status: Patient declined  Intimate Partner Violence: Not on file    Review of Systems 13 point review of systems per patient health survey noted.  Negative other than as indicated above or in HPI.   Objective:   Vitals:   09/24/23 0840  BP: 124/66  Pulse: 67  Resp: 13  Temp: 98.3 F (36.8 C)  TempSrc: Temporal  SpO2: 98%  Weight: 164 lb 9.6 oz (74.7 kg)  Height: 5' 5.25 (1.657 m)     Physical Exam Vitals reviewed.  Constitutional:      Appearance: He is well-developed.  HENT:     Head: Normocephalic and atraumatic.     Right Ear: External ear normal.     Left Ear: External ear normal.   Eyes:     Conjunctiva/sclera: Conjunctivae normal.     Pupils: Pupils are equal, round, and reactive to light.   Neck:     Thyroid : No thyromegaly.   Cardiovascular:     Rate and Rhythm: Normal rate and regular rhythm.     Heart sounds: Normal heart sounds.  Pulmonary:     Effort: Pulmonary effort is normal. No respiratory distress.     Breath sounds: Normal breath sounds. No wheezing.  Abdominal:     General: There is no distension.     Palpations: Abdomen is soft.  Tenderness: There is no abdominal tenderness.   Musculoskeletal:        General: No tenderness. Normal range of motion.     Cervical back: Normal range of motion and neck supple.  Lymphadenopathy:     Cervical: No cervical adenopathy.   Skin:    General: Skin is warm and dry.   Neurological:     Mental Status: He is alert and  oriented to person, place, and time.     Deep Tendon Reflexes: Reflexes are normal and symmetric.   Psychiatric:        Behavior: Behavior normal.        Assessment & Plan:  Phillip Glass is a 68 y.o. male . Annual physical exam  - -anticipatory guidance as below in AVS, screening labs above. Health maintenance items as above in HPI discussed/recommended as applicable.   - Recovering well from knee surgery.  Essential hypertension - Plan: amLODipine  (NORVASC ) 5 MG tablet  -  Stable, tolerating current regimen. Medications refilled. Labs pending as above.   Erectile dysfunction, unspecified erectile dysfunction type - Plan: sildenafil  (VIAGRA ) 100 MG tablet  - viagra  Rx given - use lowest effective dose. Side effects discussed (including but not limited to headache/flushing, blue discoloration of vision, possible vascular steal and risk of cardiac effects if underlying unknown coronary artery disease, and permanent sensorineural hearing loss). Understanding expressed.  Hyperlipidemia, unspecified hyperlipidemia type - Plan: Lipid panel, simvastatin  (ZOCOR ) 20 MG tablet  -  Stable, tolerating current regimen. Medications refilled. Labs pending as above.   Prediabetes - Plan: Hemoglobin A1c  - check A1c.    Meds ordered this encounter  Medications   amLODipine  (NORVASC ) 5 MG tablet    Sig: TAKE 1 TABLET(5 MG) BY MOUTH DAILY    Dispense:  90 tablet    Refill:  2   sildenafil  (VIAGRA ) 100 MG tablet    Sig: Take 0.5-1 tablets (50-100 mg total) by mouth daily as needed for erectile dysfunction.    Dispense:  6 tablet    Refill:  5   simvastatin  (ZOCOR ) 20 MG tablet    Sig: TAKE 1 TABLET(20 MG) BY MOUTH AT BEDTIME    Dispense:  90 tablet    Refill:  2   Patient Instructions  Thank you for coming in today. No change in medications at this time. If there are any concerns on your bloodwork, I will let you know. Take care!  Preventive Care 27 Years and Older, Male Preventive  care refers to lifestyle choices and visits with your health care provider that can promote health and wellness. Preventive care visits are also called wellness exams. What can I expect for my preventive care visit? Counseling During your preventive care visit, your health care provider may ask about your: Medical history, including: Past medical problems. Family medical history. History of falls. Current health, including: Emotional well-being. Home life and relationship well-being. Sexual activity. Memory and ability to understand (cognition). Lifestyle, including: Alcohol, nicotine or tobacco, and drug use. Access to firearms. Diet, exercise, and sleep habits. Work and work Astronomer. Sunscreen use. Safety issues such as seatbelt and bike helmet use. Physical exam Your health care provider will check your: Height and weight. These may be used to calculate your BMI (body mass index). BMI is a measurement that tells if you are at a healthy weight. Waist circumference. This measures the distance around your waistline. This measurement also tells if you are at a healthy weight and may help predict your  risk of certain diseases, such as type 2 diabetes and high blood pressure. Heart rate and blood pressure. Body temperature. Skin for abnormal spots. What immunizations do I need?  Vaccines are usually given at various ages, according to a schedule. Your health care provider will recommend vaccines for you based on your age, medical history, and lifestyle or other factors, such as travel or where you work. What tests do I need? Screening Your health care provider may recommend screening tests for certain conditions. This may include: Lipid and cholesterol levels. Diabetes screening. This is done by checking your blood sugar (glucose) after you have not eaten for a while (fasting). Hepatitis C test. Hepatitis B test. HIV (human immunodeficiency virus) test. STI (sexually  transmitted infection) testing, if you are at risk. Lung cancer screening. Colorectal cancer screening. Prostate cancer screening. Abdominal aortic aneurysm (AAA) screening. You may need this if you are a current or former smoker. Talk with your health care provider about your test results, treatment options, and if necessary, the need for more tests. Follow these instructions at home: Eating and drinking  Eat a diet that includes fresh fruits and vegetables, whole grains, lean protein, and low-fat dairy products. Limit your intake of foods with high amounts of sugar, saturated fats, and salt. Take vitamin and mineral supplements as recommended by your health care provider. Do not drink alcohol if your health care provider tells you not to drink. If you drink alcohol: Limit how much you have to 0-2 drinks a day. Know how much alcohol is in your drink. In the [place] one drink equals one 12 oz bottle of beer (355 mL), one 5 oz glass of wine (148 mL), or one 1 oz glass of hard liquor (44 mL). Lifestyle Brush your teeth every morning and night with fluoride toothpaste. Floss one time each day. Exercise for at least 30 minutes 5 or more days each week. Do not use any products that contain nicotine or tobacco. These products include cigarettes, chewing tobacco, and vaping devices, such as e-cigarettes. If you need help quitting, ask your health care provider. Do not use drugs. If you are sexually active, practice safe sex. Use a condom or other form of protection to prevent STIs. Take aspirin only as told by your health care provider. Make sure that you understand how much to take and what form to take. Work with your health care provider to find out whether it is safe and beneficial for you to take aspirin daily. Ask your health care provider if you need to take a cholesterol-lowering medicine (statin). Find healthy ways to manage stress, such as: Meditation, yoga, or listening to  music. Journaling. Talking to a trusted person. Spending time with friends and family. Safety Always wear your seat belt while driving or riding in a vehicle. Do not drive: If you have been drinking alcohol. Do not ride with someone who has been drinking. When you are tired or distracted. While texting. If you have been using any mind-altering substances or drugs. Wear a helmet and other protective equipment during sports activities. If you have firearms in your house, make sure you follow all gun safety procedures. Minimize exposure to UV radiation to reduce your risk of skin cancer. What's next? Visit your health care provider once a year for an annual wellness visit. Ask your health care provider how often you should have your eyes and teeth checked. Stay up to date on all vaccines. This information is not intended to replace advice  given to you by your health care provider. Make sure you discuss any questions you have with your health care provider. Document Revised: 09/11/2020 Document Reviewed: 09/11/2020 Elsevier Patient Education  2024 Else[employer] Inc.     Signed,   Reyes Pines, MD [facility], [facility] 09/24/23 9:27 AM

## 2023-09-26 ENCOUNTER — Ambulatory Visit: Payer: Self-pay | Admitting: Family Medicine

## 2023-11-02 ENCOUNTER — Ambulatory Visit (INDEPENDENT_AMBULATORY_CARE_PROVIDER_SITE_OTHER): Admitting: *Deleted

## 2023-11-02 VITALS — Ht 66.0 in | Wt 160.2 lb

## 2023-11-02 DIAGNOSIS — Z0000 Encounter for general adult medical examination without abnormal findings: Secondary | ICD-10-CM

## 2023-11-02 NOTE — Patient Instructions (Signed)
 Phillip Glass , Thank you for taking time to come for your Medicare Wellness Visit. I appreciate your ongoing commitment to your health goals. Please review the following plan we discussed and let me know if I can assist you in the future.   Screening recommendations/referrals: Colonoscopy: up to date Recommended yearly ophthalmology/optometry visit for glaucoma screening and checkup Recommended yearly dental visit for hygiene and checkup  Vaccinations: Influenza vaccine: up to date Pneumococcal vaccine: up to date Tdap vaccine: up to date Shingles vaccine: Education provided        Preventive Care 65 Years and Older, Male Preventive care refers to lifestyle choices and visits with your health care provider that can promote health and wellness. What does preventive care include? A yearly physical exam. This is also called an annual well check. Dental exams once or twice a year. Routine eye exams. Ask your health care provider how often you should have your eyes checked. Personal lifestyle choices, including: Daily care of your teeth and gums. Regular physical activity. Eating a healthy diet. Avoiding tobacco and drug use. Limiting alcohol use. Practicing safe sex. Taking low doses of aspirin every day. Taking vitamin and mineral supplements as recommended by your health care provider. What happens during an annual well check? The services and screenings done by your health care provider during your annual well check will depend on your age, overall health, lifestyle risk factors, and family history of disease. Counseling  Your health care provider may ask you questions about your: Alcohol use. Tobacco use. Drug use. Emotional well-being. Home and relationship well-being. Sexual activity. Eating habits. History of falls. Memory and ability to understand (cognition). Work and work Astronomer. Screening  You may have the following tests or measurements: Height, weight, and  BMI. Blood pressure. Lipid and cholesterol levels. These may be checked every 5 years, or more frequently if you are over 8 years old. Skin check. Lung cancer screening. You may have this screening every year starting at age 49 if you have a 30-pack-year history of smoking and currently smoke or have quit within the past 15 years. Fecal occult blood test (FOBT) of the stool. You may have this test every year starting at age 72. Flexible sigmoidoscopy or colonoscopy. You may have a sigmoidoscopy every 5 years or a colonoscopy every 10 years starting at age 31. Prostate cancer screening. Recommendations will vary depending on your family history and other risks. Hepatitis C blood test. Hepatitis B blood test. Sexually transmitted disease (STD) testing. Diabetes screening. This is done by checking your blood sugar (glucose) after you have not eaten for a while (fasting). You may have this done every 1-3 years. Abdominal aortic aneurysm (AAA) screening. You may need this if you are a current or former smoker. Osteoporosis. You may be screened starting at age 75 if you are at high risk. Talk with your health care provider about your test results, treatment options, and if necessary, the need for more tests. Vaccines  Your health care provider may recommend certain vaccines, such as: Influenza vaccine. This is recommended every year. Tetanus, diphtheria, and acellular pertussis (Tdap, Td) vaccine. You may need a Td booster every 10 years. Zoster vaccine. You may need this after age 13. Pneumococcal 13-valent conjugate (PCV13) vaccine. One dose is recommended after age 21. Pneumococcal polysaccharide (PPSV23) vaccine. One dose is recommended after age 70. Talk to your health care provider about which screenings and vaccines you need and how often you need them. This information is not  intended to replace advice given to you by your health care provider. Make sure you discuss any questions you have  with your health care provider. Document Released: 04/12/2015 Document Revised: 12/04/2015 Document Reviewed: 01/15/2015 Elsevier Interactive Patient Education  2017 [employer].  Fall Prevention in the Home Falls can cause injuries. They can happen to people of all ages. There are many things you can do to make your home safe and to help prevent falls. What can I do on the outside of my home? Regularly fix the edges of walkways and driveways and fix any cracks. Remove anything that might make you trip as you walk through a door, such as a raised step or threshold. Trim any bushes or trees on the path to your home. Use bright outdoor lighting. Clear any walking paths of anything that might make someone trip, such as rocks or tools. Regularly check to see if handrails are loose or broken. Make sure that both sides of any steps have handrails. Any raised decks and porches should have guardrails on the edges. Have any leaves, snow, or ice cleared regularly. Use sand or salt on walking paths during winter. Clean up any spills in your garage right away. This includes oil or grease spills. What can I do in the bathroom? Use night lights. Install grab bars by the toilet and in the tub and shower. Do not use towel bars as grab bars. Use non-skid mats or decals in the tub or shower. If you need to sit down in the shower, use a plastic, non-slip stool. Keep the floor dry. Clean up any water that spills on the floor as soon as it happens. Remove soap buildup in the tub or shower regularly. Attach bath mats securely with double-sided non-slip rug tape. Do not have throw rugs and other things on the floor that can make you trip. What can I do in the bedroom? Use night lights. Make sure that you have a light by your bed that is easy to reach. Do not use any sheets or blankets that are too big for your bed. They should not hang down onto the floor. Have a firm chair that has side arms. You can use  this for support while you get dressed. Do not have throw rugs and other things on the floor that can make you trip. What can I do in the kitchen? Clean up any spills right away. Avoid walking on wet floors. Keep items that you use a lot in easy-to-reach places. If you need to reach something above you, use a strong step stool that has a grab bar. Keep electrical cords out of the way. Do not use floor polish or wax that makes floors slippery. If you must use wax, use non-skid floor wax. Do not have throw rugs and other things on the floor that can make you trip. What can I do with my stairs? Do not leave any items on the stairs. Make sure that there are handrails on both sides of the stairs and use them. Fix handrails that are broken or loose. Make sure that handrails are as long as the stairways. Check any carpeting to make sure that it is firmly attached to the stairs. Fix any carpet that is loose or worn. Avoid having throw rugs at the top or bottom of the stairs. If you do have throw rugs, attach them to the floor with carpet tape. Make sure that you have a light switch at the top of the stairs  and the bottom of the stairs. If you do not have them, ask someone to add them for you. What else can I do to help prevent falls? Wear shoes that: Do not have high heels. Have rubber bottoms. Are comfortable and fit you well. Are closed at the toe. Do not wear sandals. If you use a stepladder: Make sure that it is fully opened. Do not climb a closed stepladder. Make sure that both sides of the stepladder are locked into place. Ask someone to hold it for you, if possible. Clearly mark and make sure that you can see: Any grab bars or handrails. First and last steps. Where the edge of each step is. Use tools that help you move around (mobility aids) if they are needed. These include: Canes. Walkers. Scooters. Crutches. Turn on the lights when you go into a dark area. Replace any light bulbs  as soon as they burn out. Set up your furniture so you have a clear path. Avoid moving your furniture around. If any of your floors are uneven, fix them. If there are any pets around you, be aware of where they are. Review your medicines with your doctor. Some medicines can make you feel dizzy. This can increase your chance of falling. Ask your doctor what other things that you can do to help prevent falls. This information is not intended to replace advice given to you by your health care provider. Make sure you discuss any questions you have with your health care provider. Document Released: 01/10/2009 Document Revised: 08/22/2015 Document Reviewed: 04/20/2014 Elsevier Interactive Patient Education  2017 [employer].

## 2023-11-02 NOTE — Progress Notes (Signed)
 Subjective:   Phillip Glass is a 68 y.o. male who presents for Medicare Annual/Subsequent preventive examination.  Visit Complete: Virtual I connected with  Phillip Glass on 11/02/23 by a video and audio enabled telemedicine application and verified that I am speaking with the correct person using two identifiers.  Patient Location: Home  Provider Location: Home Office  I discussed the limitations of evaluation and management by telemedicine. The patient expressed understanding and agreed to proceed.  Vital Signs: Because this visit was a virtual/telehealth visit, some criteria may be missing or patient reported. Any vitals not documented were not able to be obtained and vitals that have been documented are patient reported.  Patient Medicare AWV questionnaire was completed by the patient on 10-30-2023; I have confirmed that all information answered by patient is correct and no changes since this date.  Cardiac Risk Factors include: advanced age (>90men, >12 women);male gender;obesity (BMI >30kg/m2);family history of premature cardiovascular disease     Objective:    Today's Vitals   11/02/23 0817  Weight: 160 lb 3.2 oz (72.7 kg)  Height: 5' 6 (1.676 m)   Body mass index is 25.86 kg/m.     11/02/2023    8:30 AM  Advanced Directives  Does Patient Have a Medical Advance Directive? No  Would patient like information on creating a medical advance directive? No - Patient declined    Current Medications (verified) Outpatient Encounter Medications as of 11/02/2023  Medication Sig   Adalimumab (HUMIRA PEN) 40 MG/0.4ML PNKT INJECT 40MG  SUBCUTANEOUSLY  EVERY 2 WEEKS   Adalimumab 40 MG/0.8ML PNKT Inject 40 mg into the skin.   Aloe Vera 500 MG CAPS Take 1 capsule by mouth daily.   amLODipine  (NORVASC ) 5 MG tablet TAKE 1 TABLET(5 MG) BY MOUTH DAILY   Bioflavonoid Products (BIOFLEX) TABS Take 1 tablet by mouth daily.   folic acid (FOLVITE) 1 MG tablet Take 1 mg by mouth daily.    GARLIC PO Take by mouth.   methotrexate (RHEUMATREX) 2.5 MG tablet Take by mouth.   Multiple Vitamin (MULTI-VITAMIN) tablet Take 1 tablet by mouth daily.   sildenafil  (VIAGRA ) 100 MG tablet Take 0.5-1 tablets (50-100 mg total) by mouth daily as needed for erectile dysfunction.   simvastatin  (ZOCOR ) 20 MG tablet TAKE 1 TABLET(20 MG) BY MOUTH AT BEDTIME   No facility-administered encounter medications on file as of 11/02/2023.    Allergies (verified) Codeine and Corylus   History: Past Medical History:  Diagnosis Date   Crohn's colitis [facility])    Past Surgical History:  Procedure Laterality Date   COLONOSCOPY  01/12/2011   PARTIAL KNEE ARTHROPLASTY Left 07/08/2023   TONSILLECTOMY     Family History  Problem Relation Age of Onset   Hypertension Father    Heart disease Father 1       AMI age 53; second  AMI age 66 cause of death   Diabetes Sister    Dementia Mother    Hypertension Mother    Social History   Socioeconomic History   Marital status: Divorced    Spouse name: Not on file   Number of children: Not on file   Years of education: Not on file   Highest education level: Associate degree: academic program  Occupational History   Not on file  Tobacco Use   Smoking status: Never   Smokeless tobacco: Never  Vaping Use   Vaping status: Never Used  Substance and Sexual Activity   Alcohol use: Not Currently  Alcohol/week: 1.0 standard drink of alcohol    Types: 1 Cans of beer per week   Drug use: No   Sexual activity: Not Currently    Birth control/protection: Abstinence  Other Topics Concern   Not on file  Social History Narrative   Marital status: divorced in 2013.  Not dating.  Married x 20 years.      Children:  1 child (22); no grandchild.      Lives: with son.      Employment: Naval architect x 35 years; happy.      Tobacco; none      Alcohol: none      Drugs; none      Exercise:  6 days per week for 45 minutes.  Cardio twice weekly; weightlifting 5  days per week.      Seatbelt: 100%      Guns:  Locked and loaded.   Social Drivers of Health   Financial Resource Strain: Patient Declined (11/02/2023)   Overall Financial Resource Strain (CARDIA)    Difficulty of Paying Living Expenses: Patient declined  Food Insecurity: Patient Declined (11/02/2023)   Hunger Vital Sign    Worried About Running Out of Food in the Last Year: Patient declined    Ran Out of Food in the Last Year: Patient declined  Transportation Needs: Patient Declined (11/02/2023)   PRAPARE - Administrator, Civil Service (Medical): Patient declined    Lack of Transportation (Non-Medical): Patient declined  Physical Activity: Sufficiently Active (11/02/2023)   Exercise Vital Sign    Days of Exercise per Week: 5 days    Minutes of Exercise per Session: 60 min  Stress: No Stress Concern Present (11/02/2023)   [employer] of Occupational Health - Occupational Stress Questionnaire    Feeling of Stress: Not at all  Social Connections: Unknown (11/02/2023)   Social Connection and Isolation Panel    Frequency of Communication with Friends and Family: More than three times a week    Frequency of Social Gatherings with Friends and Family: More than three times a week    Attends Religious Services: Patient declined    Database administrator or Organizations: Yes    Attends Banker Meetings: 1 to 4 times per year    Marital Status: Patient declined    Tobacco Counseling Counseling given: Not Answered   Clinical Intake:  Pre-visit preparation completed: Yes  Pain : No/denies pain     Diabetes: No  How often do you need to have someone help you when you read instructions, pamphlets, or other written materials from your doctor or pharmacy?: 1 - Never  Interpreter Needed?: No  Information entered by :: Mliss Graff LPN   Activities of Daily Living    11/02/2023    8:17 AM 10/30/2023    8:52 AM  In your present state of health, do you have any  difficulty performing the following activities:  Hearing? 0 0  Vision? 0 0  Difficulty concentrating or making decisions? 0 0  Walking or climbing stairs? 0 0  Dressing or bathing? 0 0  Doing errands, shopping? 0 0  Preparing Food and eating ? N N  Using the Toilet? N N  In the past six months, have you accidently leaked urine? N N  Do you have problems with loss of bowel control? N N  Managing your Medications? N N  Managing your Finances? N N  Housekeeping or managing your Housekeeping? N N  Patient Care Team: Levora Reyes SAUNDERS, MD as PCP - General (Family Medicine) Charmaine Glance, MD (Gastroenterology) Landy Belvie Sieving, MD (Gastroenterology)  Indicate any recent Medical Services you may have received from other than [facility] providers in the past year (date may be approximate).     Assessment:   This is a routine wellness examination for Lafontaine.  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 1 1 0 0 0  Number falls in past yr: 0 0 0 0 0  Injury with Fall? 0 0 0 0 0  Risk for fall due to :   No Fall Risks  No Fall Risks  Follow up Falls evaluation completed;Education provided;Falls prevention discussed  Falls evaluation completed  Falls evaluation completed    MEDICARE RISK AT HOME: Medicare Risk at Home Any  stairs in or around the home?: No If so, are there any without handrails?: No Home free of loose throw rugs in walkways, pet beds, electrical cords, etc?: No Adequate lighting in your home to reduce risk of falls?: No Life alert?: No Use of a cane, walker or w/c?: No Grab bars in the bathroom?: No Shower chair or bench in shower?: No Elevated toilet seat or a handicapped toilet?: No  TIMED UP AND GO:  Was the test performed?  No    Cognitive Function:        11/02/2023    8:16 AM  6CIT Screen  What Year? 0 points  What month? 0 points  What time? 0 points  Count back from 20 0 points  Months in reverse 0 points  Repeat phrase 0 points  Total Score 0 points    Immunizations Immunization History  Administered Date(s) Administered   Fluad Trivalent(High Dose 65+) 12/02/2022   Influenza,inj,Quad PF,6+ Mos 01/22/2014, 01/18/2015, 01/29/2016, 01/08/2017, 01/15/2018, 12/22/2018, 11/30/2019, 02/03/2021, 12/19/2021   PFIZER(Purple Top)SARS-COV-2 Vaccination 08/30/2019, 09/20/2019   Pneumococcal Conjugate-13 09/04/2021   Pneumococcal Polysaccharide-23 12/11/2015   Tdap 06/24/2017    TDAP status: Up to date  Flu Vaccine status: Up to date  Pneumococcal vaccine status: Up to date  Covid-19 vaccine status: Declined, Education has been provided regarding the importance of this vaccine but patient still declined. Advised may receive this vaccine at local pharmacy or Health Dept.or vaccine clinic. Aware to provide a copy of the vaccination record if obtained from local pharmacy or Health Dept. Verbalized acceptance and understanding.  Qualifies for Shingles Vaccine? Yes  Zostavax completed No   Shingrix Completed?: No.    Education has been provided regarding the importance of this vaccine. Patient has been advised to call insurance company to determine out of pocket expense if they have not yet received this vaccine. Advised may also receive vaccine at local pharmacy or Health  Dept. Verbalized acceptance and understanding.  Screening Tests Health Maintenance  Topic Date Due   Zoster Vaccines- Shingrix (1 of 2) Never done   INFLUENZA VACCINE  10/29/2023   Medicare Annual Wellness (AWV)  11/01/2024   Pneumococcal Vaccine: 50+ Years (3 of 3 - PCV20 or PCV21) 09/05/2026   DTaP/Tdap/Td (2 - Td or Tdap) 06/25/2027   Colonoscopy  10/30/2029   Hepatitis C Screening  Completed   Hepatitis B Vaccines  Aged Out   HPV VACCINES  Aged Out   Meningococcal B Vaccine  Aged Out   COVID-19 Vaccine  Discontinued    Health Maintenance  Health Maintenance Due  Topic Date Due   Zoster Vaccines- Shingrix (1 of 2) Never done   INFLUENZA VACCINE  10/29/2023    Colorectal cancer screening: Type of screening: Colonoscopy. Completed 2016. Repeat every 10 years  Lung Cancer Screening: (Low Dose CT Chest recommended if Age 46-80 years, 20 pack-year currently smoking OR have quit w/in 15years.) does not qualify.   Lung Cancer Screening Referral:   Additional Screening:  Hepatitis C Screening: does not qualify; Completed 2022  Vision Screening: Recommended annual ophthalmology exams for early detection of glaucoma and other disorders of the eye. Is the patient up to date with their annual eye exam?  Yes  Who is the provider or what is the name of the office in which the patient attends annual eye exams? [facility] If pt is not established with a provider, would they like to be referred to a provider to establish care? No .   Dental Screening: Recommended annual dental exams for proper oral hygiene   Community Resource Referral / Chronic Care Management: CRR required this visit?  No   CCM required this visit?  No     Plan:     I have personally reviewed and noted the following in the patient's chart:   Medical and social history Use of alcohol, tobacco or illicit drugs  Current medications and supplements including opioid prescriptions. Patient is not currently  taking opioid prescriptions. Functional ability and status Nutritional status Physical activity Advanced directives List of other physicians Hospitalizations, surgeries, and ER visits in previous 12 months Vitals Screenings to include cognitive, depression, and falls Referrals and appointments  In addition, I have reviewed and discussed with patient certain preventive protocols, quality metrics, and best practice recommendations. A written personalized care plan for preventive services as well as general preventive health recommendations were provided to patient.     Mliss Graff, LPN   04/02/7972   After Visit Summary: (MyChart) Due to this being a telephonic visit, the after visit summary with patients personalized plan was offered to patient via MyChart   Nurse Notes:

## 2023-12-23 DIAGNOSIS — M0609 Rheumatoid arthritis without rheumatoid factor, multiple sites: Secondary | ICD-10-CM | POA: Diagnosis not present

## 2024-01-10 ENCOUNTER — Other Ambulatory Visit: Payer: Self-pay | Admitting: Family Medicine

## 2024-01-10 DIAGNOSIS — I10 Essential (primary) hypertension: Secondary | ICD-10-CM

## 2024-01-24 ENCOUNTER — Ambulatory Visit (INDEPENDENT_AMBULATORY_CARE_PROVIDER_SITE_OTHER)

## 2024-01-24 DIAGNOSIS — Z23 Encounter for immunization: Secondary | ICD-10-CM

## 2024-01-24 NOTE — Progress Notes (Signed)
 Phillip Glass is a 68 y.o. male presents in office today for a nurse visit for Flu Vaccine .   Patient Injection was given in the  Left deltoid. Patient tolerated injection well.   Patient's next injection due n/a, appt made? not applicable  [employer]

## 2024-02-10 ENCOUNTER — Encounter: Payer: Self-pay | Admitting: Family Medicine

## 2024-02-10 ENCOUNTER — Ambulatory Visit (INDEPENDENT_AMBULATORY_CARE_PROVIDER_SITE_OTHER): Admitting: Family Medicine

## 2024-02-10 VITALS — BP 124/60 | HR 63 | Temp 98.0°F | Resp 14 | Ht 66.0 in | Wt 167.6 lb

## 2024-02-10 DIAGNOSIS — R5383 Other fatigue: Secondary | ICD-10-CM | POA: Diagnosis not present

## 2024-02-10 DIAGNOSIS — J069 Acute upper respiratory infection, unspecified: Secondary | ICD-10-CM

## 2024-02-10 NOTE — Progress Notes (Signed)
 Subjective:  Patient ID: Phillip Glass, male    DOB: [birth year]  Age: 68 y.o. MRN: [id number]  CC:  Chief Complaint  Patient presents with   Fatigue    Notes other sxs have cleared however fatigue has continued on     HPI Phillip Glass presents for   Fatigue Started with body aches 11/7, continued into next day. Sore throat 2 days later. Improved into Monday (3 days ago), some body aches, chills. No home covid testing. No dyspnea. Slight cough - now improved. No confusion/chest pain. Eating/drinking ok. Sick contacts at work - no known covid. Most symptoms better - still somewhat tired/sluggish. Getting better.  No rash.   Tx: dayquil, nyquil.    History Patient Active Problem List   Diagnosis Date Noted   Osteoarthritis of knee 09/24/2023   Inflammatory arthritis 09/24/2023   Rheumatoid arthritis (HCC) 09/24/2023   Polyarthralgia 01/18/2014   Crohn's disease of colon without complication (HCC) 04/08/2012   Multiple nevi 03/03/2012   Past Medical History:  Diagnosis Date   Crohn's colitis [facility])    Past Surgical History:  Procedure Laterality Date   COLONOSCOPY  01/12/2011   PARTIAL KNEE ARTHROPLASTY Left 07/08/2023   TONSILLECTOMY     Allergies  Allergen Reactions   Codeine Other (See Comments)    Pt states he hallucinates   Corylus Hives   Prior to Admission medications   Medication Sig Start Date End Date Taking? Authorizing Provider  Adalimumab (HUMIRA PEN) 40 MG/0.4ML PNKT INJECT 40MG  SUBCUTANEOUSLY  EVERY 2 WEEKS 02/28/21  Yes [provider]  Adalimumab 40 MG/0.8ML PNKT Inject 40 mg into the skin. 05/22/15  Yes [provider]  Aloe Vera 500 MG CAPS Take 1 capsule by mouth daily.   Yes [provider]  amLODipine  (NORVASC ) 5 MG tablet TAKE 1 TABLET(5 MG) BY MOUTH DAILY 01/10/24  Yes Levora Reyes SAUNDERS, MD  Bioflavonoid Products (BIOFLEX) TABS Take 1 tablet by mouth daily.   Yes [provider]  folic acid (FOLVITE) 1  MG tablet Take 1 mg by mouth daily.   Yes [provider]  GARLIC PO Take by mouth.   Yes [provider]  methotrexate (RHEUMATREX) 2.5 MG tablet Take by mouth.   Yes [provider]  Multiple Vitamin (MULTI-VITAMIN) tablet Take 1 tablet by mouth daily.   Yes [provider]  sildenafil  (VIAGRA ) 100 MG tablet Take 0.5-1 tablets (50-100 mg total) by mouth daily as needed for erectile dysfunction. 09/24/23  Yes Levora Reyes SAUNDERS, MD  simvastatin  (ZOCOR ) 20 MG tablet TAKE 1 TABLET(20 MG) BY MOUTH AT BEDTIME 09/24/23  Yes Levora Reyes SAUNDERS, MD   Social History   Socioeconomic History   Marital status: Divorced    Spouse name: Not on file   Number of children: Not on file   Years of education: Not on file   Highest education level: Associate degree: academic program  Occupational History   Not on file  Tobacco Use   Smoking status: Never   Smokeless tobacco: Never  Vaping Use   Vaping status: Never Used  Substance and Sexual Activity   Alcohol use: Not Currently    Alcohol/week: 1.0 standard drink of alcohol    Types: 1 Cans of beer per week   Drug use: No   Sexual activity: Not Currently    Birth control/protection: Abstinence  Other Topics Concern   Not on file  Social History Narrative   Marital status: divorced in 2013.  Not dating.  Married x 20 years.      Children:  1 child (22); no grandchild.      Lives: with son.      Employment: naval architect x 35 years; happy.      Tobacco; none      Alcohol: none      Drugs; none      Exercise:  6 days per week for 45 minutes.  Cardio twice weekly; weightlifting 5 days per week.      Seatbelt: 100%      Guns:  Locked and loaded.   Social Drivers of Health   Financial Resource Strain: Patient Declined (02/07/2024)   Overall Financial Resource Strain (CARDIA)    Difficulty of Paying Living Expenses: Patient declined  Food Insecurity: Patient Declined (02/07/2024)   Hunger Vital Sign     Worried About Running Out of Food in the Last Year: Patient declined    Ran Out of Food in the Last Year: Patient declined  Transportation Needs: Patient Declined (02/07/2024)   PRAPARE - Administrator, Civil Service (Medical): Patient declined    Lack of Transportation (Non-Medical): Patient declined  Physical Activity: Sufficiently Active (02/07/2024)   Exercise Vital Sign    Days of Exercise per Week: 5 days    Minutes of Exercise per Session: 50 min  Stress: No Stress Concern Present (02/07/2024)   [employer] of Occupational Health - Occupational Stress Questionnaire    Feeling of Stress: Not at all  Social Connections: Unknown (02/07/2024)   Social Connection and Isolation Panel    Frequency of Communication with Friends and Family: Patient declined    Frequency of Social Gatherings with Friends and Family: Patient declined    Attends Religious Services: Patient declined    Database Administrator or Organizations: Yes    Attends Engineer, Structural: More than 4 times per year    Marital Status: Patient declined  Intimate Partner Violence: Not At Risk (11/02/2023)   Humiliation, Afraid, Rape, and Kick questionnaire    Fear of Current or Ex-Partner: No    Emotionally Abused: No    Physically Abused: No    Sexually Abused: No    Review of Systems Per HPI.   Objective:   Vitals:   02/10/24 0839  BP: 124/60  Pulse: 63  Resp: 14  Temp: 98 F (36.7 C)  TempSrc: Temporal  SpO2: 99%  Weight: 167 lb 9.6 oz (76 kg)  Height: 5' 6 (1.676 m)     Physical Exam Vitals reviewed.  Constitutional:      Appearance: He is well-developed.  HENT:     Head: Normocephalic and atraumatic.     Right Ear: Tympanic membrane, ear canal and external ear normal.     Left Ear: Tympanic membrane, ear canal and external ear normal.     Nose: No rhinorrhea.     Comments: Sinuses nontender.     Mouth/Throat:     Pharynx: No oropharyngeal exudate or posterior  oropharyngeal erythema.  Eyes:     Conjunctiva/sclera: Conjunctivae normal.     Pupils: Pupils are equal, round, and reactive to light.  Cardiovascular:     Rate and Rhythm: Normal rate and regular rhythm.     Heart sounds: Normal heart sounds. No murmur heard. Pulmonary:     Effort: Pulmonary effort is normal.     Breath sounds: Normal breath sounds. No wheezing, rhonchi or rales.  Abdominal:     Palpations: Abdomen is  soft.     Tenderness: There is no abdominal tenderness.  Musculoskeletal:     Cervical back: Neck supple.  Lymphadenopathy:     Cervical: No cervical adenopathy.  Skin:    General: Skin is warm and dry.     Findings: No rash.  Neurological:     Mental Status: He is alert and oriented to person, place, and time.  Psychiatric:        Behavior: Behavior normal.        Assessment & Plan:  Phillip Glass is a 68 y.o. male . Upper respiratory tract infection, unspecified type  Fatigue, unspecified type Suspected viral illness with overall improving symptoms, some residual fatigue.  Reassuring exam.  Hold on testing at this time given timing of symptoms and improvement.  Symptomatic care, rest, fluids, RTC precautions discussed as well as slow resumption of exercise as symptoms improve.   No orders of the defined types were placed in this encounter.  Patient Instructions  Thanks for coming today.  I suspect you had a viral illness, potentially what others at work have had as well.  However based on time since initial symptoms and improving symptoms at this time I do not think any testing is needed or any new medications.  Continue to drink plenty of fluids, get rest as needed, healthy diet, and I expect the fatigue to improve through the weekend into next week.  Happy to see you if any new or worsening symptoms but again I expect you will continue to improve.  Take care!    Signed,   Reyes Pines, MD  Primary Care, [facility] 02/10/24 9:17 AM

## 2024-02-10 NOTE — Patient Instructions (Signed)
 Thanks for coming today.  I suspect you had a viral illness, potentially what others at work have had as well.  However based on time since initial symptoms and improving symptoms at this time I do not think any testing is needed or any new medications.  Continue to drink plenty of fluids, get rest as needed, healthy diet, and I expect the fatigue to improve through the weekend into next week.  Happy to see you if any new or worsening symptoms but again I expect you will continue to improve.  Take care!

## 2024-03-06 ENCOUNTER — Other Ambulatory Visit: Payer: Self-pay | Admitting: Family Medicine

## 2024-03-06 DIAGNOSIS — E785 Hyperlipidemia, unspecified: Secondary | ICD-10-CM

## 2024-03-16 DIAGNOSIS — M170 Bilateral primary osteoarthritis of knee: Secondary | ICD-10-CM | POA: Diagnosis not present

## 2024-03-16 DIAGNOSIS — M0609 Rheumatoid arthritis without rheumatoid factor, multiple sites: Secondary | ICD-10-CM | POA: Diagnosis not present

## 2024-03-16 DIAGNOSIS — K50818 Crohn's disease of both small and large intestine with other complication: Secondary | ICD-10-CM | POA: Diagnosis not present

## 2024-03-16 DIAGNOSIS — M79672 Pain in left foot: Secondary | ICD-10-CM | POA: Diagnosis not present

## 2024-03-26 ENCOUNTER — Telehealth: Payer: Self-pay | Admitting: Family Medicine

## 2024-03-26 NOTE — Telephone Encounter (Signed)
 Called patient, advised him of the need to reschedule tomorrow's appointment unfortunately due to weather and travel delays.  He does state he is off this week so does have some flexibility in rescheduling.  Advised that our staff will be calling him tomorrow to reschedule appointment.  Appreciate his understanding (and wished him Happy Birthday).

## 2024-03-27 ENCOUNTER — Ambulatory Visit: Admitting: Family Medicine

## 2024-03-31 ENCOUNTER — Encounter: Payer: Self-pay | Admitting: Family Medicine

## 2024-03-31 ENCOUNTER — Ambulatory Visit: Admitting: Family Medicine

## 2024-03-31 VITALS — BP 112/64 | HR 63 | Temp 98.8°F | Resp 17 | Ht 66.0 in | Wt 166.4 lb

## 2024-03-31 DIAGNOSIS — I10 Essential (primary) hypertension: Secondary | ICD-10-CM | POA: Diagnosis not present

## 2024-03-31 DIAGNOSIS — E785 Hyperlipidemia, unspecified: Secondary | ICD-10-CM

## 2024-03-31 DIAGNOSIS — N529 Male erectile dysfunction, unspecified: Secondary | ICD-10-CM

## 2024-03-31 DIAGNOSIS — R7303 Prediabetes: Secondary | ICD-10-CM | POA: Diagnosis not present

## 2024-03-31 LAB — LIPID PANEL
Cholesterol: 160 mg/dL (ref 28–200)
HDL: 67.3 mg/dL
LDL Cholesterol: 80 mg/dL (ref 10–99)
NonHDL: 92.22
Total CHOL/HDL Ratio: 2
Triglycerides: 63 mg/dL (ref 10.0–149.0)
VLDL: 12.6 mg/dL (ref 0.0–40.0)

## 2024-03-31 LAB — COMPREHENSIVE METABOLIC PANEL WITH GFR
ALT: 16 U/L (ref 3–53)
AST: 18 U/L (ref 5–37)
Albumin: 4.5 g/dL (ref 3.5–5.2)
Alkaline Phosphatase: 57 U/L (ref 39–117)
BUN: 13 mg/dL (ref 6–23)
CO2: 29 meq/L (ref 19–32)
Calcium: 8.9 mg/dL (ref 8.4–10.5)
Chloride: 101 meq/L (ref 96–112)
Creatinine, Ser: 1.07 mg/dL (ref 0.40–1.50)
GFR: 71.55 mL/min
Glucose, Bld: 91 mg/dL (ref 70–99)
Potassium: 3.7 meq/L (ref 3.5–5.1)
Sodium: 139 meq/L (ref 135–145)
Total Bilirubin: 0.6 mg/dL (ref 0.2–1.2)
Total Protein: 7.1 g/dL (ref 6.0–8.3)

## 2024-03-31 LAB — HEMOGLOBIN A1C: Hgb A1c MFr Bld: 5.6 % (ref 4.6–6.5)

## 2024-03-31 MED ORDER — SILDENAFIL CITRATE 100 MG PO TABS: 50.0000 mg | ORAL_TABLET | Freq: Every day | ORAL | 5 refills | Status: AC | PRN

## 2024-03-31 NOTE — Progress Notes (Signed)
 "  Subjective:  Patient ID: Phillip Glass, male    DOB: 01/07/1956  Age: 69 y.o. MRN: [id number]  CC:  Chief Complaint  Patient presents with   Follow-up    6 month follow up.     HPI Phillip Glass presents for med follow up.   Followed by rheumatology - Dr. Mai. RA, Crohn's.  On Humira.  Optho eval last week - no concerns.   Hypertension: Amlodipine  5mg  every day. No leg swelling or side effects.  Regular exercise. Now doing tai chi. On silver sneakers. No CP/DOE.  Home BP 125/65.   Wt Readings from Last 3 Encounters:  03/31/24 166 lb 6.4 oz (75.5 kg)  02/10/24 167 lb 9.6 oz (76 kg)  11/02/23 160 lb 3.2 oz (72.7 kg)   BP Readings from Last 3 Encounters:  03/31/24 112/64  02/10/24 124/60  09/24/23 124/66   Lab Results  Component Value Date   CREATININE 1.02 03/25/2023   Hyperlipidemia: Zocor  20mg  every day.  No new myalgias or side effects.  Last ate last night. Apple this am.  Lab Results  Component Value Date   CHOL 182 09/24/2023   HDL 65.10 09/24/2023   LDLCALC 101 (H) 09/24/2023   TRIG 76.0 09/24/2023   CHOLHDL 3 09/24/2023   Lab Results  Component Value Date   ALT 22 03/25/2023   AST 21 03/25/2023   ALKPHOS 60 03/25/2023   BILITOT 0.6 03/25/2023   Erectile dysfunction: Sildenafil  100 mg with half dosing effective for treatment of symptoms without new side effects such as headache or flushing, no vision hearing changes or chest pain/dyspnea with exertion.  Prediabetes: Borderline on last labs - exercising and watching diet.  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    History Patient Active Problem List   Diagnosis Date Noted   Osteoarthritis of knee 09/24/2023   Inflammatory arthritis 09/24/2023   Rheumatoid arthritis (HCC) 09/24/2023    Polyarthralgia 01/18/2014   Crohn's disease of colon without complication (HCC) 04/08/2012   Multiple nevi 03/03/2012   Past Medical History:  Diagnosis Date   Allergy 1993   Codeine   Arthritis 2015   Not bad now   Crohn's colitis (HCC)    Hypertension 2020   It's 125 over 65 now   Past Surgical History:  Procedure Laterality Date   COLONOSCOPY  01/12/2011   JOINT REPLACEMENT  07/08/2023   Knee replacement left knee   PARTIAL KNEE ARTHROPLASTY Left 07/08/2023   TONSILLECTOMY     Allergies[1] Prior to Admission medications  Medication Sig Start Date End Date Taking? Authorizing Provider  Adalimumab (HUMIRA PEN) 40 MG/0.4ML PNKT INJECT 40MG  SUBCUTANEOUSLY  EVERY 2 WEEKS 02/28/21  Yes [provider]  Aloe Vera 500 MG CAPS Take 1 capsule by mouth daily.   Yes [provider]  amLODipine  (NORVASC ) 5 MG tablet TAKE 1 TABLET(5 MG) BY MOUTH DAILY 01/10/24  Yes Phillip Phillip SAUNDERS, MD  Bioflavonoid Products (BIOFLEX) TABS Take 1 tablet by mouth daily.   Yes [provider]  folic acid (FOLVITE) 1 MG tablet Take 1 mg by mouth daily.   Yes [provider]  GARLIC PO Take by mouth.   Yes [provider]  methotrexate (RHEUMATREX) 2.5 MG  tablet Take by mouth.   Yes [provider]  Multiple Vitamin (MULTI-VITAMIN) tablet Take 1 tablet by mouth daily.   Yes [provider]  sildenafil  (VIAGRA ) 100 MG tablet Take 0.5-1 tablets (50-100 mg total) by mouth daily as needed for erectile dysfunction. 09/24/23  Yes Phillip Phillip SAUNDERS, MD  simvastatin  (ZOCOR ) 20 MG tablet TAKE 1 TABLET(20 MG) BY MOUTH AT BEDTIME 03/06/24  Yes Phillip Phillip SAUNDERS, MD  Adalimumab 40 MG/0.8ML PNKT Inject 40 mg into the skin. Patient not taking: Reported on 03/31/2024 05/22/15   [provider]   Social History   Socioeconomic History   Marital status: Divorced    Spouse name: Not on file   Number of children: Not on file   Years of education: Not on  file   Highest education level: Associate degree: academic program  Occupational History   Not on file  Tobacco Use   Smoking status: Never   Smokeless tobacco: Never  Vaping Use   Vaping status: Never Used  Substance and Sexual Activity   Alcohol use: Not Currently    Alcohol/week: 1.0 standard drink of alcohol    Types: 1 Cans of beer per week   Drug use: No   Sexual activity: Not Currently    Birth control/protection: Abstinence  Other Topics Concern   Not on file  Social History Narrative   Marital status: divorced in 2013.  Not dating.  Married x 20 years.      Children:  1 child (22); no grandchild.      Lives: with son.      Employment: naval architect x 35 years; happy.      Tobacco; none      Alcohol: none      Drugs; none      Exercise:  6 days per week for 45 minutes.  Cardio twice weekly; weightlifting 5 days per week.      Seatbelt: 100%      Guns:  Locked and loaded.   Social Drivers of Health   Tobacco Use: Low Risk (03/31/2024)   Patient History    Smoking Tobacco Use: Never    Smokeless Tobacco Use: Never    Passive Exposure: Not on file  Financial Resource Strain: Patient Declined (02/07/2024)   Overall Financial Resource Strain (CARDIA)    Difficulty of Paying Living Expenses: Patient declined  Food Insecurity: Patient Declined (02/07/2024)   Epic    Worried About Programme Researcher, Broadcasting/film/video in the Last Year: Patient declined    Barista in the Last Year: Patient declined  Transportation Needs: Patient Declined (02/07/2024)   Epic    Lack of Transportation (Medical): Patient declined    Lack of Transportation (Non-Medical): Patient declined  Physical Activity: Sufficiently Active (02/07/2024)   Exercise Vital Sign    Days of Exercise per Week: 5 days    Minutes of Exercise per Session: 50 min  Stress: No Stress Concern Present (02/07/2024)   [employer] of Occupational Health - Occupational Stress Questionnaire    Feeling of Stress: Not  at all  Social Connections: Unknown (02/07/2024)   Social Connection and Isolation Panel    Frequency of Communication with Friends and Family: Patient declined    Frequency of Social Gatherings with Friends and Family: Patient declined    Attends Religious Services: Patient declined    Active Member of Clubs or Organizations: Yes    Attends Engineer, Structural: More than 4 times per year  Marital Status: Patient declined  Intimate Partner Violence: Not At Risk (11/02/2023)   Epic    Fear of Current or Ex-Partner: No    Emotionally Abused: No    Physically Abused: No    Sexually Abused: No  Depression (PHQ2-9): Low Risk (02/10/2024)   Depression (PHQ2-9)    PHQ-2 Score: 0  Alcohol Screen: Low Risk (02/07/2024)   Alcohol Screen    Last Alcohol Screening Score (AUDIT): 0  Housing: Patient Declined (02/07/2024)   Epic    Unable to Pay for Housing in the Last Year: Patient declined    Number of Times Moved in the Last Year: Not on file    Homeless in the Last Year: Patient declined  Utilities: Patient Declined (11/02/2023)   Epic    Threatened with loss of utilities: Patient declined  Health Literacy: Adequate Health Literacy (11/02/2023)   B1300 Health Literacy    Frequency of need for help with medical instructions: Never    Review of Systems  Constitutional:  Negative for fatigue and unexpected weight change.  Eyes:  Negative for visual disturbance.  Respiratory:  Negative for cough, chest tightness and shortness of breath.   Cardiovascular:  Negative for chest pain, palpitations and leg swelling.  Gastrointestinal:  Negative for abdominal pain and blood in stool.  Neurological:  Negative for dizziness, light-headedness and headaches.     Objective:   Vitals:   03/31/24 1133  BP: 112/64  Pulse: 63  Resp: 17  Temp: 98.8 F (37.1 C)  TempSrc: Temporal  SpO2: 98%  Weight: 166 lb 6.4 oz (75.5 kg)  Height: 5' 6 (1.676 m)     Physical Exam Vitals reviewed.   Constitutional:      Appearance: He is well-developed.  HENT:     Head: Normocephalic and atraumatic.  Neck:     Vascular: No carotid bruit or JVD.  Cardiovascular:     Rate and Rhythm: Normal rate and regular rhythm.     Heart sounds: Normal heart sounds. No murmur heard. Pulmonary:     Effort: Pulmonary effort is normal.     Breath sounds: Normal breath sounds. No rales.  Musculoskeletal:     Right lower leg: No edema.     Left lower leg: No edema.  Skin:    General: Skin is warm and dry.  Neurological:     Mental Status: He is alert and oriented to person, place, and time.  Psychiatric:        Mood and Affect: Mood normal.        Assessment & Plan:  Phillip Glass is a 69 y.o. male . Hyperlipidemia, unspecified hyperlipidemia type - Plan: Comprehensive metabolic panel with GFR, Lipid panel  -  Stable, tolerating current regimen. Medications have refills Labs pending as above.   Erectile dysfunction, unspecified erectile dysfunction type - Plan: sildenafil  (VIAGRA ) 100 MG tablet  - Stable on current regimen, continue 1/2 dosing if effective.  Refilled.  Prediabetes - Plan: Hemoglobin A1c  - Borderline previously, commended on exercise, weight is stable from last year.  Check A1c and adjust plan accordingly  Essential hypertension - Plan: Comprehensive metabolic panel with GFR  - Stable on current regimen, check labs above, continue same regimen.  28-month follow-up for physical.  Meds ordered this encounter  Medications   sildenafil  (VIAGRA ) 100 MG tablet    Sig: Take 0.5-1 tablets (50-100 mg total) by mouth daily as needed for erectile dysfunction.    Dispense:  6 tablet  Refill:  5   Patient Instructions  Thank you for coming in today. No change in medications at this time. If there are any concerns on your bloodwork, I will let you know. Take care!     Signed,   Phillip Pines, MD [facility], [facility] 03/31/2024 12:46 PM      [1]  Allergies Allergen Reactions   Codeine Other (See Comments)    Pt states he hallucinates   Corylus Hives   "

## 2024-03-31 NOTE — Patient Instructions (Signed)
 Thank you for coming in today. No change in medications at this time. If there are any concerns on your bloodwork, I will let you know. Take care!

## 2024-04-01 ENCOUNTER — Ambulatory Visit: Payer: Self-pay | Admitting: Family Medicine

## 2024-11-07 ENCOUNTER — Encounter
